# Patient Record
Sex: Male | Born: 1940 | Race: White | Hispanic: No | Marital: Married | Smoking: Never smoker
Health system: Southern US, Community
[De-identification: ages and names within clinical notes are randomized; demographics above are authoritative.]

## PROBLEM LIST (undated history)

## (undated) DIAGNOSIS — F0390 Unspecified dementia without behavioral disturbance: Secondary | ICD-10-CM

## (undated) DIAGNOSIS — R12 Heartburn: Secondary | ICD-10-CM

## (undated) DIAGNOSIS — I509 Heart failure, unspecified: Secondary | ICD-10-CM

## (undated) DIAGNOSIS — Z2239 Carrier of other specified bacterial diseases: Secondary | ICD-10-CM

## (undated) DIAGNOSIS — I4891 Unspecified atrial fibrillation: Secondary | ICD-10-CM

## (undated) DIAGNOSIS — I499 Cardiac arrhythmia, unspecified: Secondary | ICD-10-CM

## (undated) DIAGNOSIS — R238 Other skin changes: Secondary | ICD-10-CM

## (undated) DIAGNOSIS — I639 Cerebral infarction, unspecified: Secondary | ICD-10-CM

## (undated) DIAGNOSIS — Z950 Presence of cardiac pacemaker: Secondary | ICD-10-CM

## (undated) DIAGNOSIS — C679 Malignant neoplasm of bladder, unspecified: Secondary | ICD-10-CM

## (undated) DIAGNOSIS — D649 Anemia, unspecified: Secondary | ICD-10-CM

## (undated) DIAGNOSIS — I429 Cardiomyopathy, unspecified: Secondary | ICD-10-CM

## (undated) DIAGNOSIS — I350 Nonrheumatic aortic (valve) stenosis: Secondary | ICD-10-CM

## (undated) DIAGNOSIS — K219 Gastro-esophageal reflux disease without esophagitis: Secondary | ICD-10-CM

## (undated) DIAGNOSIS — R600 Localized edema: Secondary | ICD-10-CM

## (undated) DIAGNOSIS — J449 Chronic obstructive pulmonary disease, unspecified: Secondary | ICD-10-CM

## (undated) DIAGNOSIS — E785 Hyperlipidemia, unspecified: Secondary | ICD-10-CM

## (undated) DIAGNOSIS — I2510 Atherosclerotic heart disease of native coronary artery without angina pectoris: Secondary | ICD-10-CM

## (undated) HISTORY — PX: CARDIAC VALVE REPLACEMENT: SHX585

## (undated) HISTORY — DX: Malignant neoplasm of bladder, unspecified: C67.9

## (undated) HISTORY — DX: Unspecified atrial fibrillation: I48.91

## (undated) HISTORY — PX: TONSILLECTOMY: SUR1361

## (undated) HISTORY — PX: CORONARY ARTERY BYPASS GRAFT: SHX141

## (undated) HISTORY — DX: Heart failure, unspecified: I50.9

## (undated) HISTORY — PX: OTHER SURGICAL HISTORY: SHX169

## (undated) HISTORY — DX: Heartburn: R12

## (undated) HISTORY — DX: Cardiac arrhythmia, unspecified: I49.9

---

## 2016-06-09 ENCOUNTER — Other Ambulatory Visit: Payer: Self-pay | Admitting: Internal Medicine

## 2016-06-09 ENCOUNTER — Ambulatory Visit
Admission: RE | Admit: 2016-06-09 | Discharge: 2016-06-09 | Disposition: A | Discharge status: Home / Self Care | Payer: Medicare Other | Source: Ambulatory Visit | Attending: Internal Medicine | Admitting: Internal Medicine

## 2016-06-09 DIAGNOSIS — J90 Pleural effusion, not elsewhere classified: Secondary | ICD-10-CM | POA: Insufficient documentation

## 2016-06-09 DIAGNOSIS — R1902 Left upper quadrant abdominal swelling, mass and lump: Secondary | ICD-10-CM | POA: Diagnosis present

## 2016-06-09 DIAGNOSIS — R079 Chest pain, unspecified: Secondary | ICD-10-CM | POA: Diagnosis present

## 2016-06-09 DIAGNOSIS — M47896 Other spondylosis, lumbar region: Secondary | ICD-10-CM | POA: Insufficient documentation

## 2016-06-09 DIAGNOSIS — R0602 Shortness of breath: Secondary | ICD-10-CM | POA: Diagnosis present

## 2016-06-09 DIAGNOSIS — K429 Umbilical hernia without obstruction or gangrene: Secondary | ICD-10-CM | POA: Insufficient documentation

## 2016-06-09 DIAGNOSIS — I700 Atherosclerosis of aorta: Secondary | ICD-10-CM | POA: Diagnosis not present

## 2016-06-09 DIAGNOSIS — I2510 Atherosclerotic heart disease of native coronary artery without angina pectoris: Secondary | ICD-10-CM | POA: Diagnosis not present

## 2016-06-09 DIAGNOSIS — I517 Cardiomegaly: Secondary | ICD-10-CM | POA: Insufficient documentation

## 2016-06-09 DIAGNOSIS — I482 Chronic atrial fibrillation, unspecified: Secondary | ICD-10-CM | POA: Insufficient documentation

## 2016-06-09 DIAGNOSIS — K8020 Calculus of gallbladder without cholecystitis without obstruction: Secondary | ICD-10-CM | POA: Diagnosis not present

## 2016-06-09 LAB — POCT I-STAT CREATININE: CREATININE: 1.1 mg/dL (ref 0.61–1.24)

## 2016-06-09 MED ORDER — IOPAMIDOL (ISOVUE-300) INJECTION 61%
125.0000 mL | Freq: Once | INTRAVENOUS | Status: AC | PRN
  Administered 2016-06-09: 125 mL via INTRAVENOUS

## 2016-06-12 DIAGNOSIS — E538 Deficiency of other specified B group vitamins: Secondary | ICD-10-CM | POA: Insufficient documentation

## 2016-06-12 DIAGNOSIS — I700 Atherosclerosis of aorta: Secondary | ICD-10-CM | POA: Insufficient documentation

## 2016-06-12 DIAGNOSIS — D500 Iron deficiency anemia secondary to blood loss (chronic): Secondary | ICD-10-CM | POA: Insufficient documentation

## 2016-06-26 NOTE — Progress Notes (Signed)
06/27/2016 11:07 AM   Kyle Gregory [birth year] [id number]  Referring provider: Rusty Aus, MD [place] [facility] [place], [place] [address]  Chief Complaint  Patient presents with  . New Patient (Initial Visit)    gross hematuria referred by Dr. Emily Gregory    HPI: Patient is a 76 -year-old Caucasian male who presents today as a referral from their PCP, Dr. Sabra Gregory, for microscopic hematuria.  Patient was found to have microscopic hematuria on 06/09/2016 with 10-50 RBC's/hpf with wife, Kyle Gregory, and daughter, Kyle Gregory.    Patient doesn't have a prior history of microscopic hematuria.    He does not have a prior history of recurrent urinary tract infections, nephrolithiasis, trauma to the genitourinary tract, BPH or malignancies of the genitourinary tract.   He does not have a family medical history of nephrolithiasis, malignancies of the genitourinary tract or hematuria.   Today, he is having symptoms of frequent urination x 5-6,  urgency, nocturia x 1.  His UA today demonstrates 11-30 WBC's and > 30 RBC's.  .  He is not experiencing any suprapubic pain, abdominal pain or flank pain. He denies any recent fevers, chills, nausea or vomiting.  He underwent a contrast CT on 06/09/2016 which noted cardiomegaly with 3 vessel coronary arteriosclerosis and aortic atherosclerosis.  Small bilateral pleural effusions with atelectasis. Ground-glass mosaic attenuation of the lungs may reflect air trapping possibly constrictive bronchiolitis.  Eccentric thickening of the right posterolateral wall of the bladder. A transitional cell lesion or focal inflammation is not excluded. Direct visual correlation is recommended.  Cholelithiasis without complication.  Fat containing umbilical hernia.  . Lumbar spondylosis.   I have independently reviewed the films.  He is not a smoker.   He is not exposed to secondhand smoke.  He worked with [employer] for a short time  after high school.     PMH: Past Medical History:  Diagnosis Date  . Atrial fibrillation ([place])   . Heartburn     Surgical History: Past Surgical History:  Procedure Laterality Date  . neck growth removal     1970's    Home Medications:  Allergies as of 06/27/2016   No Known Allergies     Medication List       Accurate as of 06/27/16 11:07 AM. Always use your most recent med list.          furosemide 20 MG tablet Commonly known as:  LASIX Take by mouth.   HM VITAMIN B-12 ULTRA STRENGTH 5000 MCG Tbdp Generic drug:  Cyanocobalamin Take 5,000 mcg by mouth daily.   lisinopril 10 MG tablet Commonly known as:  PRINIVIL,ZESTRIL Take by mouth.   metoprolol succinate 50 MG 24 hr tablet Commonly known as:  TOPROL-XL Take by mouth.   omeprazole 20 MG capsule Commonly known as:  PRILOSEC Take by mouth.   VITRON-C 65-125 MG Tabs Generic drug:  Iron-Vitamin C Take by mouth.       Allergies: No Known Allergies  Family History: Family History  Problem Relation Age of Onset  . Heart failure Father   . Heart failure Brother   . Atrial fibrillation Sister   . Prostate cancer Neg Hx   . Kidney cancer Neg Hx   . Bladder Cancer Neg Hx     Social History:  reports that he has never smoked. He has never used smokeless tobacco. He reports that he does not drink alcohol or use drugs.  ROS: UROLOGY Frequent Urination?: Yes  Hard to postpone urination?: Yes Burning/pain with urination?: No Get up at night to urinate?: Yes Leakage of urine?: No Urine stream starts and stops?: No Trouble starting stream?: No Do you have to strain to urinate?: No Blood in urine?: Yes Urinary tract infection?: No Sexually transmitted disease?: No Injury to kidneys or bladder?: No Painful intercourse?: No Weak stream?: No Erection problems?: No Penile pain?: No  Gastrointestinal Nausea?: No Vomiting?: No Indigestion/heartburn?: Yes Diarrhea?: No Constipation?:  No  Constitutional Fever: No Night sweats?: No Weight loss?: No Fatigue?: Yes  Skin Skin rash/lesions?: No Itching?: No  Eyes Blurred vision?: No Double vision?: No  Ears/Nose/Throat Sore throat?: No Sinus problems?: No  Hematologic/Lymphatic Swollen glands?: No Easy bruising?: No  Cardiovascular Leg swelling?: Yes Chest pain?: No  Respiratory Cough?: No Shortness of breath?: Yes  Endocrine Excessive thirst?: No  Musculoskeletal Back pain?: No Joint pain?: Yes  Neurological Headaches?: No Dizziness?: Yes  Psychologic Depression?: No Anxiety?: No  Physical Exam: BP 117/75   Pulse 87   Ht 5\' 7"  (1.702 m)   Wt 261 lb 14.4 oz (118.8 kg)   BMI 41.02 kg/m   Constitutional: Well nourished. Alert and oriented, No acute distress. HEENT: [place] AT, moist mucus membranes. Trachea midline, no masses. Cardiovascular: No clubbing, cyanosis, or edema. Respiratory: Normal respiratory effort, no increased work of breathing. GI: Abdomen is soft, non tender, non distended, no abdominal masses. Liver and spleen not palpable.  No hernias appreciated.  Stool sample for occult testing is not indicated.   GU: No CVA tenderness.  No bladder fullness or masses.  Patient with circumcised phallus. Urethral meatus is patent.  No penile discharge. No penile lesions or rashes. Scrotum without lesions, cysts, rashes and/or edema.  Testicles are located scrotally bilaterally. No masses are appreciated in the testicles. Left and right epididymis are normal. Rectal: Patient with  normal sphincter tone. Anus and perineum without scarring or rashes. No rectal masses are appreciated. Prostate is approximately 55 grams, no nodules are appreciated. Seminal vesicles are normal. Skin: No rashes, bruises or suspicious lesions. Lymph: No cervical or inguinal adenopathy. Neurologic: Grossly intact, no focal deficits, moving all 4 extremities. Psychiatric: Normal mood and affect.  Laboratory  Data:  Lab Results  Component Value Date   CREATININE 1.10 06/09/2016   PSA history  0.36 ng/mL on 06/09/2016  Urinalysis 11-30 WBC's.  > 30 RBC's.  See EPIC.    Pertinent Imaging: CLINICAL DATA:  Dyspnea x3 weeks. Abdominal swelling times 7-8 months. Denies nausea, vomiting and diarrhea.  EXAM: CT CHEST, ABDOMEN, AND PELVIS WITH CONTRAST  TECHNIQUE: Multidetector CT imaging of the chest, abdomen and pelvis was performed following the standard protocol during bolus administration of intravenous contrast.  CONTRAST:  129mL ISOVUE-300 IOPAMIDOL (ISOVUE-300) INJECTION 61%  COMPARISON:  None.  FINDINGS: CT CHEST FINDINGS  Cardiovascular: Cardiomegaly without pericardial effusion or thickening. Aortic atherosclerosis without dissection or aneurysm. Three-vessel coronary arteriosclerosis. No large central pulmonary embolus.  Mediastinum/Nodes: Small middle mediastinal lymph nodes are noted without pathologic appearing enlargement, the largest is right lower paratracheal measuring approximately 0.9 cm short axis. The thyroid gland is normal in size with 6 mm calcification noted in the right thyroid gland. The mainstem bronchi and trachea are patent. No esophageal abnormality identified.  Lungs/Pleura: Small bilateral pleural effusions. Lingular and right middle lobe atelectasis versus scarring. No pulmonary consolidation or pneumothorax. Slight mosaic attenuation of the lungs bilaterally.  Musculoskeletal: No acute osseous abnormality. Flowing osteophytes along the lower thoracic spine consistent with diffuse  idiopathic skeletal hyperostosis.  CT ABDOMEN PELVIS FINDINGS  Hepatobiliary: Uncomplicated cholelithiasis. No space-occupying mass of the liver. No biliary dilatation.  Pancreas: Unremarkable. No pancreatic ductal dilatation or surrounding inflammatory changes.  Spleen: Normal in size without focal abnormality.  Adrenals/Urinary Tract: Simple  bilateral renal cysts ranging in size from 1.2 cm through 3.7 cm on the right and 3.3 cm on left. Normal bilateral adrenal glands. No nephrolithiasis nor hydronephrosis. Eccentric mucosal thickening along posterolateral wall of the bladder on the right. Direct visual correlation is recommended to exclude a mucosal lesion.  Stomach/Bowel: Normal small bowel rotation. Contrast distended stomach. No small nor large bowel inflammation or obstruction. Scattered colonic diverticulosis without acute diverticulitis. Normal appendix.  Vascular/Lymphatic: Aortic and branch vessel atherosclerosis. Bilateral iliofemoral atherosclerosis. No lymphadenopathy by size criteria.  Reproductive: Prostate is unremarkable.  Other: Fat containing umbilical hernia.  No ascites or free air.  Musculoskeletal: Lumbar spondylosis with degenerative disc disease L2-3 and L4-5 with lower lumbar facet arthrosis. No acute osseous abnormality. Bone islands noted of right femoral head.  IMPRESSION: 1. Cardiomegaly with 3 vessel coronary arteriosclerosis and aortic atherosclerosis. 2. Small bilateral pleural effusions with atelectasis. Ground-glass mosaic attenuation of the lungs may reflect air trapping possibly constrictive bronchiolitis. 3. Eccentric thickening of the right posterolateral wall of the bladder. A transitional cell lesion or focal inflammation is not excluded. Direct visual correlation is recommended. 4. Cholelithiasis without complication. 5. Fat containing umbilical hernia. 6. Lumbar spondylosis.   Electronically Signed   By: Ashley Royalty M.D.   On: 06/09/2016 19:11   Assessment & Plan:    1. Gross hematuria  - I explained to the patient that there are a number of causes that can be associated with blood in the urine, such as stones, BPH, UTI's, damage to the urinary tract and/or cancer.  - At this time, I felt that the patient warranted further urologic evaluation.   The AUA  guidelines state that a CT urogram is the preferred imaging study to evaluate hematuria - the contrast study he underwent on 06/09/2016 lacks the detail to exclude some urologic tumors  - If a bladder tumor is discovered, we can pursue cystoscopy with bilateral retrogrades in the OR to complete the hematuria workup in addition to the imaging studies during the TURBT  - If no bladder tumor is discovered, we can pursue a CT Urogram to complete the hematuria work up  - I've recommended a cystoscopy. I described how this is performed, typically in an office setting with a flexible cystoscope. We described the risks, benefits, and possible side effects, the most common of which is a minor amount of blood in the urine and/or burning which usually resolves in 24 to 48 hours.   - The patient had the opportunity to ask questions which were answered. Based upon this discussion, the patient is willing to proceed. Therefore, I've ordered: a cystoscopy.  - The patient will return following all of the above for discussion of the results.   - UA  - Urine culture    2. Bladder wall thickening  - patient will be undergoing cystoscopy in the future  Return for cystoscopy.  These notes generated with voice recognition software. I apologize for typographical errors.  Zara Council, [facility] [address] [place], [place] [address] [phone]

## 2016-06-27 ENCOUNTER — Ambulatory Visit (INDEPENDENT_AMBULATORY_CARE_PROVIDER_SITE_OTHER): Payer: Medicare Other | Admitting: Urology

## 2016-06-27 ENCOUNTER — Encounter: Payer: Self-pay | Admitting: Urology

## 2016-06-27 VITALS — BP 117/75 | HR 87 | Ht 67.0 in | Wt 261.9 lb

## 2016-06-27 DIAGNOSIS — R3129 Other microscopic hematuria: Secondary | ICD-10-CM

## 2016-06-27 DIAGNOSIS — N3289 Other specified disorders of bladder: Secondary | ICD-10-CM

## 2016-06-27 LAB — URINALYSIS, COMPLETE
Bilirubin, UA: NEGATIVE
GLUCOSE, UA: NEGATIVE
KETONES UA: NEGATIVE
Nitrite, UA: NEGATIVE
PROTEIN UA: NEGATIVE
Urobilinogen, Ur: 0.2 mg/dL (ref 0.2–1.0)
pH, UA: 5.5 (ref 5.0–7.5)

## 2016-06-27 LAB — MICROSCOPIC EXAMINATION: RBC, UA: >30 /hpf — AB (ref 0–?)

## 2016-06-27 NOTE — Patient Instructions (Addendum)
Cystoscopy, Care After Refer to this sheet in the next few weeks. These instructions provide you with information about caring for yourself after your procedure. Your health care provider may also give you more specific instructions. Your treatment has been planned according to current medical practices, but problems sometimes occur. Call your health care provider if you have any problems or questions after your procedure. What can I expect after the procedure? After the procedure, it is common to have:  Mild pain when you urinate. Pain should stop within a few minutes after you urinate. This may last for up to 1 week.  A small amount of blood in your urine for several days.  Feeling like you need to urinate but producing only a small amount of urine. Follow these instructions at home:   Medicines   Take over-the-counter and prescription medicines only as told by your health care provider.  If you were prescribed an antibiotic medicine, take it as told by your health care provider. Do not stop taking the antibiotic even if you start to feel better. General instructions    Return to your normal activities as told by your health care provider. Ask your health care provider what activities are safe for you.  Do not drive for 24 hours if you received a sedative.  Watch for any blood in your urine. If the amount of blood in your urine increases, call your health care provider.  Follow instructions from your health care provider about eating or drinking restrictions.  If a tissue sample was removed for testing (biopsy) during your procedure, it is your responsibility to get your test results. Ask your health care provider or the department performing the test when your results will be ready.  Drink enough fluid to keep your urine clear or pale yellow.  Keep all follow-up visits as told by your health care provider. This is important. Contact a health care provider if:  You have pain that  gets worse or does not get better with medicine, especially pain when you urinate.  You have difficulty urinating. Get help right away if:  You have more blood in your urine.  You have blood clots in your urine.  You have abdominal pain.  You have a fever or chills.  You are unable to urinate. This information is not intended to replace advice given to you by your health care provider. Make sure you discuss any questions you have with your health care provider. Document Released: 11/04/2004 Document Revised: 09/23/2015 Document Reviewed: 03/04/2015 Elsevier Interactive Patient Education  2017 [employer].  Cystoscopy Cystoscopy is a procedure that is used to help diagnose and sometimes treat conditions that affect that lower urinary tract. The lower urinary tract includes the bladder and the tube that drains urine from the bladder out of the body (urethra). Cystoscopy is performed with a thin, tube-shaped instrument with a light and camera at the end (cystoscope). The cystoscope may be hard (rigid) or flexible, depending on the goal of the procedure.The cystoscope is inserted through the urethra, into the bladder. Cystoscopy may be recommended if you have:  Urinary tractinfections that keep coming back (recurring).  Blood in the urine (hematuria).  Loss of bladder control (urinary incontinence) or an overactive bladder.  Unusual cells found in a urine sample.  A blockage in the urethra.  Painful urination.  An abnormality in the bladder found during an intravenous pyelogram (IVP) or CT scan. Cystoscopy may also be done to remove a sample of tissue  to be examined under a microscope (biopsy). Tell a health care provider about:  Any allergies you have.  All medicines you are taking, including vitamins, herbs, eye drops, creams, and over-the-counter medicines.  Any problems you or family members have had with anesthetic medicines.  Any blood disorders you have.  Any  surgeries you have had.  Any medical conditions you have.  Whether you are pregnant or may be pregnant. What are the risks? Generally, this is a safe procedure. However, problems may occur, including:  Infection.  Bleeding.  Allergic reactions to medicines.  Damage to other structures or organs. What happens before the procedure?  Ask your health care provider about:  Changing or stopping your regular medicines. This is especially important if you are taking diabetes medicines or blood thinners.  Taking medicines such as aspirin and ibuprofen. These medicines can thin your blood. Do not take these medicines before your procedure if your health care provider instructs you not to.  Follow instructions from your health care provider about eating or drinking restrictions.  You may be given antibiotic medicine to help prevent infection.  You may have an exam or testing, such as X-rays of the bladder, urethra, or kidneys.  You may have urine tests to check for signs of infection.  Plan to have someone take you home after the procedure. What happens during the procedure?  To reduce your risk of infection,your health care team will wash or sanitize their hands.  You will be given one or more of the following:  A medicine to help you relax (sedative).  A medicine to numb the area (local anesthetic).  The area around the opening of your urethra will be cleaned.  The cystoscope will be passed through your urethra into your bladder.  Germ-free (sterile)fluid will flow through the cystoscope to fill your bladder. The fluid will stretch your bladder so that your surgeon can clearly examine your bladder walls.  The cystoscope will be removed and your bladder will be emptied. The procedure may vary among health care providers and hospitals. What happens after the procedure?  You may have some soreness or pain in your abdomen and urethra. Medicines will be available to help  you.  You may have some blood in your urine.  Do not drive for 24 hours if you received a sedative. This information is not intended to replace advice given to you by your health care provider. Make sure you discuss any questions you have with your health care provider. Document Released: 04/14/2000 Document Revised: 08/26/2015 Document Reviewed: 03/04/2015 Elsevier Interactive Patient Education  2017 [employer].

## 2016-06-30 LAB — CULTURE, URINE COMPREHENSIVE

## 2016-07-11 DIAGNOSIS — I420 Dilated cardiomyopathy: Secondary | ICD-10-CM | POA: Insufficient documentation

## 2016-07-11 DIAGNOSIS — I350 Nonrheumatic aortic (valve) stenosis: Secondary | ICD-10-CM | POA: Insufficient documentation

## 2016-07-12 ENCOUNTER — Other Ambulatory Visit: Payer: Self-pay | Admitting: Internal Medicine

## 2016-07-12 DIAGNOSIS — I6523 Occlusion and stenosis of bilateral carotid arteries: Secondary | ICD-10-CM

## 2016-07-17 ENCOUNTER — Other Ambulatory Visit: Payer: Self-pay | Admitting: Gastroenterology

## 2016-07-17 DIAGNOSIS — R1310 Dysphagia, unspecified: Secondary | ICD-10-CM

## 2016-07-18 ENCOUNTER — Ambulatory Visit: Payer: Medicare Other | Admitting: Urology

## 2016-07-18 ENCOUNTER — Encounter: Payer: Self-pay | Admitting: Urology

## 2016-07-18 VITALS — BP 154/94 | HR 78 | Ht 72.0 in | Wt 248.6 lb

## 2016-07-18 DIAGNOSIS — R3129 Other microscopic hematuria: Secondary | ICD-10-CM

## 2016-07-18 LAB — URINALYSIS, COMPLETE
BILIRUBIN UA: NEGATIVE
Glucose, UA: NEGATIVE
Ketones, UA: NEGATIVE
LEUKOCYTES UA: NEGATIVE
Nitrite, UA: NEGATIVE
PH UA: 5 (ref 5.0–7.5)
Protein, UA: NEGATIVE
Specific Gravity, UA: <1.005 — ABNORMAL LOW (ref 1.005–1.030)
UUROB: 0.2 mg/dL (ref 0.2–1.0)

## 2016-07-18 LAB — MICROSCOPIC EXAMINATION
Bacteria, UA: NONE SEEN
Epithelial Cells (non renal): NONE SEEN /hpf (ref 0–10)
WBC UA: NONE SEEN /HPF (ref 0–?)

## 2016-07-18 MED ORDER — CIPROFLOXACIN HCL 500 MG PO TABS
500.0000 mg | ORAL_TABLET | Freq: Once | ORAL | Status: AC
  Administered 2016-07-18: 500 mg via ORAL

## 2016-07-18 MED ORDER — LIDOCAINE HCL 2 % EX GEL
1.0000 "application " | Freq: Once | CUTANEOUS | Status: AC
  Administered 2016-07-18: 1 via URETHRAL

## 2016-07-18 NOTE — Addendum Note (Signed)
Addended by: Tommy Rainwater on: 07/18/2016 04:45 PM   Modules accepted: Orders

## 2016-07-18 NOTE — Progress Notes (Signed)
   07/18/16  CC:  Chief Complaint  Patient presents with  . Cysto    microscopic hematuria     HPI: 76 year old with history of microscopic hematuria February 2018 with 10-50 red blood cells per high-powered field seen on UA. He had a normal DRE and a PSA of 0.36. Urine culture grew 1000 mixed growth. CT scan 06/09/2016 which showed possible neoplasm right posterior bladder. He presents for cystoscopy. He's had no gross hematuria or dysuria.    There were no vitals taken for this visit. NED. A&Ox3.   No respiratory distress   Abd soft, NT, ND Normal phallus with bilateral descended testicles  Cystoscopy Procedure Note  Patient identification was confirmed, informed consent was obtained, and patient was prepped using Betadine solution.  Lidocaine jelly was administered per urethral meatus.    Preoperative abx where received prior to procedure.     Pre-Procedure: - Inspection reveals a normal caliber ureteral meatus.  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 We discussed the nature risks benefits and alternatives to transurethral resection of bladder tumor. We discussed risk of bleeding infection and perforation among others. At this point I think there are too many tumors to utilize mitomycin-C. We discussed he may need a staged procedure and he may need a right ureteral stent although I believe the tumors are foreign of lateral from the right ureteral  orifice. We discussed he may need a staged procedure. I discussed one of my colleagues would be performing the procedure. Per Dr. Ammie Ferrier note, we will set up cardiology clearance.

## 2016-07-19 ENCOUNTER — Telehealth: Payer: Self-pay

## 2016-07-19 ENCOUNTER — Ambulatory Visit
Admission: RE | Admit: 2016-07-19 | Discharge: 2016-07-19 | Disposition: A | Discharge status: Home / Self Care | Payer: Medicare Other | Source: Ambulatory Visit | Attending: Internal Medicine | Admitting: Internal Medicine

## 2016-07-19 DIAGNOSIS — I6523 Occlusion and stenosis of bilateral carotid arteries: Secondary | ICD-10-CM

## 2016-07-19 NOTE — Telephone Encounter (Signed)
Spoke with patient and notified him that surgery has been scheduled on 08-14-16 with Dr. Erlene Quan for a TURBT, pre-admit apt on 08-07-16@8am . A clearance form has been sent to Dr. Ubaldo Glassing and will follow up after his apt on 08-04-16. Patient verbalized agreement and will call if he has any questions or concerns

## 2016-07-22 LAB — CULTURE, URINE COMPREHENSIVE

## 2016-07-26 ENCOUNTER — Ambulatory Visit
Admission: RE | Admit: 2016-07-26 | Discharge: 2016-07-26 | Disposition: A | Discharge status: Home / Self Care | Payer: Medicare Other | Source: Ambulatory Visit | Attending: Gastroenterology | Admitting: Gastroenterology

## 2016-07-26 DIAGNOSIS — K219 Gastro-esophageal reflux disease without esophagitis: Secondary | ICD-10-CM | POA: Diagnosis not present

## 2016-07-26 DIAGNOSIS — K449 Diaphragmatic hernia without obstruction or gangrene: Secondary | ICD-10-CM | POA: Diagnosis not present

## 2016-07-26 DIAGNOSIS — R1310 Dysphagia, unspecified: Secondary | ICD-10-CM | POA: Diagnosis present

## 2016-08-04 ENCOUNTER — Other Ambulatory Visit: Payer: Medicare Other

## 2016-08-06 ENCOUNTER — Telehealth: Payer: Self-pay | Admitting: Urology

## 2016-08-06 NOTE — Telephone Encounter (Signed)
Patient's wife, [name], called the office and left a voice mail stating that patient has a pre-op appointment that they need to change.  Please contact them at [phone].

## 2016-08-07 ENCOUNTER — Encounter
Admission: RE | Admit: 2016-08-07 | Discharge: 2016-08-07 | Disposition: A | Discharge status: Home / Self Care | Payer: Medicare Other | Source: Ambulatory Visit | Attending: Urology | Admitting: Urology

## 2016-08-07 DIAGNOSIS — Z01818 Encounter for other preprocedural examination: Secondary | ICD-10-CM | POA: Insufficient documentation

## 2016-08-07 HISTORY — DX: Anemia, unspecified: D64.9

## 2016-08-07 HISTORY — DX: Nonrheumatic aortic (valve) stenosis: I35.0

## 2016-08-07 HISTORY — DX: Cardiomyopathy, unspecified: I42.9

## 2016-08-07 NOTE — Patient Instructions (Signed)
Your procedure is scheduled on: Monday 08/14/16 Report to [facility]. 2ND FLOOR MEDICAL MALL ENTRANCE. To find out your arrival time please call [phone] between 1PM - 3PM on Friday 08/11/16.  Remember: Instructions that are not followed completely may result in serious medical risk, up to and including death, or upon the discretion of your surgeon and anesthesiologist your surgery may need to be rescheduled.    __X__ 1. Do not eat food or drink liquids after midnight. No gum chewing or hard candies.     __X__ 2. No Alcohol for 24 hours before or after surgery.   ____ 3. Bring all medications with you on the day of surgery if instructed.    __X__ 4. Notify your doctor if there is any change in your medical condition     (cold, fever, infections).             ___X__5. No smoking within 24 hours of your surgery.     Do not wear jewelry, make-up, hairpins, clips or nail polish.  Do not wear lotions, powders, or perfumes.   Do not shave 48 hours prior to surgery. Men may shave face and neck.  Do not bring valuables to the hospital.    [facility] is not responsible for any belongings or valuables.               Contacts, dentures or bridgework may not be worn into surgery.  Leave your suitcase in the car. After surgery it may be brought to your room.  For patients admitted to the hospital, discharge time is determined by your                treatment team.   Patients discharged the day of surgery will not be allowed to drive home.   Please read over the following fact sheets that you were given:   Pain Booklet and MRSA Information   __X__ Take these medicines the morning of surgery with A SIP OF WATER:    1. LISINOPRIL  2. METOPROLOL  3. OMEPRAZOLE  4.  5.  6.  ____ Fleet Enema (as directed)   ____ Use CHG Soap as directed  ____ Use inhalers on the day of surgery  ____ Stop metformin 2 days prior to surgery    ____ Take 1/2 of usual insulin dose the night before  surgery and none on the morning of surgery.   ____ Stop Coumadin/Plavix/aspirin on   __X__ Stop Anti-inflammatories such as Advil, Aleve, Ibuprofen, Motrin, Naproxen, Naprosyn, Goodies,powder, or aspirin products.  OK to take Tylenol.   __X__ Stop supplements until after surgery.  IRON OK  ____ Bring C-Pap to the hospital.

## 2016-08-14 ENCOUNTER — Encounter
Admission: RE | Disposition: A | Discharge status: Home / Self Care | Payer: Self-pay | Source: Ambulatory Visit | Attending: Urology

## 2016-08-14 ENCOUNTER — Ambulatory Visit
Admission: RE | Admit: 2016-08-14 | Discharge: 2016-08-14 | Disposition: A | Discharge status: Home / Self Care | Payer: Medicare Other | Source: Ambulatory Visit | Attending: Urology | Admitting: Urology

## 2016-08-14 ENCOUNTER — Encounter: Payer: Self-pay | Admitting: Emergency Medicine

## 2016-08-14 ENCOUNTER — Ambulatory Visit: Payer: Medicare Other | Admitting: Certified Registered Nurse Anesthetist

## 2016-08-14 DIAGNOSIS — C672 Malignant neoplasm of lateral wall of bladder: Secondary | ICD-10-CM | POA: Insufficient documentation

## 2016-08-14 DIAGNOSIS — N359 Urethral stricture, unspecified: Secondary | ICD-10-CM | POA: Insufficient documentation

## 2016-08-14 DIAGNOSIS — D494 Neoplasm of unspecified behavior of bladder: Secondary | ICD-10-CM | POA: Diagnosis not present

## 2016-08-14 DIAGNOSIS — D414 Neoplasm of uncertain behavior of bladder: Secondary | ICD-10-CM | POA: Diagnosis present

## 2016-08-14 DIAGNOSIS — R311 Benign essential microscopic hematuria: Secondary | ICD-10-CM | POA: Diagnosis not present

## 2016-08-14 DIAGNOSIS — N99111 Postprocedural bulbous urethral stricture: Secondary | ICD-10-CM

## 2016-08-14 HISTORY — PX: CYSTOSCOPY W/ RETROGRADES: SHX1426

## 2016-08-14 HISTORY — PX: TRANSURETHRAL RESECTION OF BLADDER TUMOR: SHX2575

## 2016-08-14 SURGERY — TURBT (TRANSURETHRAL RESECTION OF BLADDER TUMOR)
Anesthesia: Choice | Site: Ureter | Laterality: Right | Wound class: Clean Contaminated

## 2016-08-14 MED ORDER — DOCUSATE SODIUM 100 MG PO CAPS: 100.0000 mg | ORAL_CAPSULE | Freq: Two times a day (BID) | ORAL | 0 refills | Status: DC

## 2016-08-14 MED ORDER — FENTANYL CITRATE (PF) 100 MCG/2ML IJ SOLN
INTRAMUSCULAR | Status: AC
  Filled 2016-08-14: qty 2

## 2016-08-14 MED ORDER — PHENYLEPHRINE HCL 10 MG/ML IJ SOLN
INTRAMUSCULAR | Status: DC | PRN
  Administered 2016-08-14: 100 ug via INTRAVENOUS
  Administered 2016-08-14: 50 ug via INTRAVENOUS
  Administered 2016-08-14 (×2): 100 ug via INTRAVENOUS
  Administered 2016-08-14: 50 ug via INTRAVENOUS
  Administered 2016-08-14: 100 ug via INTRAVENOUS

## 2016-08-14 MED ORDER — HYDROCODONE-ACETAMINOPHEN 5-325 MG PO TABS: 1.0000 | ORAL_TABLET | Freq: Four times a day (QID) | ORAL | 0 refills | Status: DC | PRN

## 2016-08-14 MED ORDER — CEFAZOLIN SODIUM-DEXTROSE 2-4 GM/100ML-% IV SOLN
INTRAVENOUS | Status: AC
  Filled 2016-08-14: qty 100

## 2016-08-14 MED ORDER — DEXTROSE 5 % IV SOLN
2.0000 g | INTRAVENOUS | Status: AC
  Administered 2016-08-14: 2 g via INTRAVENOUS
  Filled 2016-08-14: qty 2000

## 2016-08-14 MED ORDER — OXYBUTYNIN CHLORIDE 5 MG PO TABS: 5.0000 mg | ORAL_TABLET | Freq: Three times a day (TID) | ORAL | 0 refills | Status: DC | PRN

## 2016-08-14 MED ORDER — DEXAMETHASONE SODIUM PHOSPHATE 10 MG/ML IJ SOLN
INTRAMUSCULAR | Status: DC | PRN
  Administered 2016-08-14: 5 mg via INTRAVENOUS

## 2016-08-14 MED ORDER — IOTHALAMATE MEGLUMINE 43 % IV SOLN
INTRAVENOUS | Status: DC | PRN
  Administered 2016-08-14: 10 mL

## 2016-08-14 MED ORDER — ONDANSETRON HCL 4 MG/2ML IJ SOLN
INTRAMUSCULAR | Status: DC | PRN
  Administered 2016-08-14: 4 mg via INTRAVENOUS

## 2016-08-14 MED ORDER — LACTATED RINGERS IV SOLN
INTRAVENOUS | Status: DC
  Administered 2016-08-14: 07:00:00 via INTRAVENOUS

## 2016-08-14 MED ORDER — FENTANYL CITRATE (PF) 100 MCG/2ML IJ SOLN
INTRAMUSCULAR | Status: DC | PRN
  Administered 2016-08-14: 50 ug via INTRAVENOUS
  Administered 2016-08-14: 25 ug via INTRAVENOUS
  Administered 2016-08-14: 50 ug via INTRAVENOUS

## 2016-08-14 MED ORDER — GLYCOPYRROLATE 0.2 MG/ML IJ SOLN
INTRAMUSCULAR | Status: DC | PRN
  Administered 2016-08-14: .15 mg via INTRAVENOUS

## 2016-08-14 MED ORDER — LIDOCAINE HCL (CARDIAC) 20 MG/ML IV SOLN
INTRAVENOUS | Status: DC | PRN
  Administered 2016-08-14: 30 mg via INTRAVENOUS

## 2016-08-14 MED ORDER — PROPOFOL 10 MG/ML IV BOLUS
INTRAVENOUS | Status: AC
  Filled 2016-08-14: qty 20

## 2016-08-14 MED ORDER — PROPOFOL 10 MG/ML IV BOLUS
INTRAVENOUS | Status: DC | PRN
  Administered 2016-08-14: 150 mg via INTRAVENOUS

## 2016-08-14 MED ORDER — CEFTRIAXONE SODIUM-DEXTROSE 2-2.22 GM-% IV SOLR
2.0000 g | INTRAVENOUS | Status: DC
  Filled 2016-08-14: qty 50

## 2016-08-14 SURGICAL SUPPLY — 36 items
BAG DRAIN CYSTO-URO LG1000N (MISCELLANEOUS) ×5 IMPLANT
BAG URO DRAIN 2000ML W/SPOUT (MISCELLANEOUS) ×5 IMPLANT
CATH FOLEY 2WAY  5CC 16FR (CATHETERS)
CATH FOLEY 2WAY 5CC 18FR (CATHETERS) ×5 IMPLANT
CATH URETL 5X70 OPEN END (CATHETERS) ×5 IMPLANT
CATH URTH 16FR FL 2W BLN LF (CATHETERS) IMPLANT
CONRAY 43 FOR UROLOGY 50M (MISCELLANEOUS) ×5 IMPLANT
DRAPE UTILITY 15X26 TOWEL STRL (DRAPES) ×5 IMPLANT
DRSG TELFA 4X3 1S NADH ST (GAUZE/BANDAGES/DRESSINGS) ×5 IMPLANT
ELECT LOOP 22F BIPOLAR SML (ELECTROSURGICAL) ×5
ELECT REM PT RETURN 9FT ADLT (ELECTROSURGICAL)
ELECTRODE LOOP 22F BIPOLAR SML (ELECTROSURGICAL) ×3 IMPLANT
ELECTRODE REM PT RTRN 9FT ADLT (ELECTROSURGICAL) IMPLANT
GLOVE BIO SURGEON STRL SZ 6.5 (GLOVE) ×8 IMPLANT
GLOVE BIO SURGEONS STRL SZ 6.5 (GLOVE) ×2
GOWN STRL REUS W/ TWL LRG LVL3 (GOWN DISPOSABLE) ×6 IMPLANT
GOWN STRL REUS W/TWL LRG LVL3 (GOWN DISPOSABLE) ×4
HOLDER FOLEY CATH W/STRAP (MISCELLANEOUS) ×5 IMPLANT
KIT RM TURNOVER CYSTO AR (KITS) ×5 IMPLANT
LOOP CUT BIPOLAR 24F LRG (ELECTROSURGICAL) IMPLANT
NDL SAFETY ECLIPSE 18X1.5 (NEEDLE) IMPLANT
NEEDLE HYPO 18GX1.5 SHARP (NEEDLE)
PACK CYSTO AR (MISCELLANEOUS) ×5 IMPLANT
SCRUB POVIDONE IODINE 4 OZ (MISCELLANEOUS) ×5 IMPLANT
SENSORWIRE 0.038 NOT ANGLED (WIRE)
SET CYSTO W/LG BORE CLAMP LF (SET/KITS/TRAYS/PACK) IMPLANT
SET IRRIG Y TYPE TUR BLADDER L (SET/KITS/TRAYS/PACK) ×5 IMPLANT
SET IRRIGATING DISP (SET/KITS/TRAYS/PACK) ×5 IMPLANT
SOL .9 NS 3000ML IRR  AL (IV SOLUTION) ×12
SOL .9 NS 3000ML IRR UROMATIC (IV SOLUTION) ×18 IMPLANT
STENT URET 6FRX24 CONTOUR (STENTS) IMPLANT
STENT URET 6FRX26 CONTOUR (STENTS) IMPLANT
SURGILUBE 2OZ TUBE FLIPTOP (MISCELLANEOUS) ×5 IMPLANT
SYRINGE IRR TOOMEY STRL 70CC (SYRINGE) ×5 IMPLANT
WATER STERILE IRR 1000ML POUR (IV SOLUTION) ×5 IMPLANT
WIRE SENSOR 0.038 NOT ANGLED (WIRE) IMPLANT

## 2016-08-14 NOTE — Interval H&P Note (Signed)
History and Physical Interval Note:  08/14/2016 7:24 AM  Kyle Gregory  has presented today for surgery, with the diagnosis of BLADDER NEOPLASM UNCERTAIN  The various methods of treatment have been discussed with the patient and family. After consideration of risks, benefits and other options for treatment, the patient has consented to  Procedure(s): TRANSURETHRAL RESECTION OF BLADDER TUMOR (TURBT) (N/A) CYSTOSCOPY WITH RETROGRADE PYELOGRAM (Right) CYSTOSCOPY WITH STENT PLACEMENT (Right) as a surgical intervention .  The patient's history has been reviewed, patient examined, no change in status, stable for surgery.  I have reviewed the patient's chart and labs.  Questions were answered to the patient's satisfaction.    RRR CTAB   Hollice Espy

## 2016-08-14 NOTE — Anesthesia Preprocedure Evaluation (Signed)
Anesthesia Evaluation  Patient identified by MRN, date of birth, ID band Patient awake    Reviewed: Allergy & Precautions, H&P , NPO status , Patient's Chart, lab work & pertinent test results, reviewed documented beta blocker date and time   Airway Mallampati: II  TM Distance: >3 FB Neck ROM: full    Dental  (+) Teeth Intact   Pulmonary neg pulmonary ROS,    Pulmonary exam normal        Cardiovascular Exercise Tolerance: Good negative cardio ROS Normal cardiovascular exam Rate:Normal     Neuro/Psych negative neurological ROS  negative psych ROS   GI/Hepatic negative GI ROS, Neg liver ROS,   Endo/Other  negative endocrine ROS  Renal/GU negative Renal ROS  negative genitourinary   Musculoskeletal   Abdominal   Peds  Hematology negative hematology ROS (+) anemia ,   Anesthesia Other Findings   Reproductive/Obstetrics negative OB ROS                             Anesthesia Physical Anesthesia Plan  ASA: III  Anesthesia Plan: General LMA   Post-op Pain Management:    Induction:   Airway Management Planned:   Additional Equipment:   Intra-op Plan:   Post-operative Plan:   Informed Consent: I have reviewed the patients History and Physical, chart, labs and discussed the procedure including the risks, benefits and alternatives for the proposed anesthesia with the patient or authorized representative who has indicated his/her understanding and acceptance.     Plan Discussed with: CRNA  Anesthesia Plan Comments:         Anesthesia Quick Evaluation

## 2016-08-14 NOTE — Anesthesia Postprocedure Evaluation (Signed)
Anesthesia Post Note  Patient: Kyle Gregory  Procedure(s) Performed: Procedure(s) (LRB): TRANSURETHRAL RESECTION OF BLADDER TUMOR (TURBT) (N/A) CYSTOSCOPY WITH RETROGRADE PYELOGRAM (Bilateral)  Patient location during evaluation: PACU Anesthesia Type: General Level of consciousness: awake and alert Pain management: pain level controlled Vital Signs Assessment: post-procedure vital signs reviewed and stable Respiratory status: spontaneous breathing, nonlabored ventilation, respiratory function stable and patient connected to nasal cannula oxygen Cardiovascular status: blood pressure returned to baseline and stable Postop Assessment: no signs of nausea or vomiting Anesthetic complications: no     Last Vitals:  Vitals:   08/14/16 0913 08/14/16 0930  BP: 116/90 (!) 111/100  Pulse: 76 64  Resp: (!) 24 14  Temp: 36.4 C     Last Pain:  Vitals:   08/14/16 0930  TempSrc:   PainSc: 0-No pain                 Molli Barrows

## 2016-08-14 NOTE — H&P (View-Only) (Signed)
   07/18/16  CC:  Chief Complaint  Patient presents with  . Cysto    microscopic hematuria     HPI: 76 year old with history of microscopic hematuria February 2018 with 10-50 red blood cells per high-powered field seen on UA. He had a normal DRE and a PSA of 0.36. Urine culture grew 1000 mixed growth. CT scan 06/09/2016 which showed possible neoplasm right posterior bladder. He presents for cystoscopy. He's had no gross hematuria or dysuria.    There were no vitals taken for this visit. NED. A&Ox3.   No respiratory distress   Abd soft, NT, ND Normal phallus with bilateral descended testicles  Cystoscopy Procedure Note  Patient identification was confirmed, informed consent was obtained, and patient was prepped using Betadine solution.  Lidocaine jelly was administered per urethral meatus.    Preoperative abx where received prior to procedure.     Pre-Procedure: - Inspection reveals a normal caliber ureteral meatus.  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 We discussed the nature risks benefits and alternatives to transurethral resection of bladder tumor. We discussed risk of bleeding infection and perforation among others. At this point I think there are too many tumors to utilize mitomycin-C. We discussed he may need a staged procedure and he may need a right ureteral stent although I believe the tumors are foreign of lateral from the right ureteral  orifice. We discussed he may need a staged procedure. I discussed one of my colleagues would be performing the procedure. Per Dr. Ammie Ferrier note, we will set up cardiology clearance.

## 2016-08-14 NOTE — Anesthesia Procedure Notes (Signed)
Procedure Name: LMA Insertion Date/Time: 08/14/2016 8:02 AM Performed by: Kennon Holter Pre-anesthesia Checklist: Patient identified, Patient being monitored, Timeout performed, Emergency Drugs available and Suction available Patient Re-evaluated:Patient Re-evaluated prior to inductionOxygen Delivery Method: Circle system utilized Preoxygenation: Pre-oxygenation with 100% oxygen Intubation Type: IV induction Ventilation: Mask ventilation without difficulty LMA: LMA inserted LMA Size: 4.5 Tube type: Oral Number of attempts: 1 Placement Confirmation: positive ETCO2 and breath sounds checked- equal and bilateral Tube secured with: Tape Dental Injury: Teeth and Oropharynx as per pre-operative assessment

## 2016-08-14 NOTE — Discharge Instructions (Signed)
Transurethral Resection of Bladder Tumor (TURBT) or Bladder Biopsy   Definition:  Transurethral Resection of the Bladder Tumor is a surgical procedure used to diagnose and remove tumors within the bladder. TURBT is the most common treatment for early stage bladder cancer.  General instructions:     Your recent bladder surgery requires very little post hospital care but some definite precautions.  Despite the fact that no skin incisions were used, the area around the bladder incisions are raw and covered with scabs to promote healing and prevent bleeding. Certain precautions are needed to insure that the scabs are not disturbed over the next 2-4 weeks while the healing proceeds.  Because the raw surface inside your bladder and the irritating effects of urine you may expect frequency of urination and/or urgency (a stronger desire to urinate) and perhaps even getting up at night more often. This will usually resolve or improve slowly over the healing period. You may see some blood in your urine over the first 6 weeks. Do not be alarmed, even if the urine was clear for a while. Get off your feet and drink lots of fluids until clearing occurs. If you start to pass clots or don't improve call us.  Diet:  You may return to your normal diet immediately. Because of the raw surface of your bladder, alcohol, spicy foods, foods high in acid and drinks with caffeine may cause irritation or frequency and should be used in moderation. To keep your urine flowing freely and avoid constipation, drink plenty of fluids during the day (8-10 glasses). Tip: Avoid cranberry juice because it is very acidic.  Activity:  Your physical activity doesn't need to be restricted. However, if you are very active, you may see some blood in the urine. We suggest that you reduce your activity under the circumstances until the bleeding has stopped.  Bowels:  It is important to keep your bowels regular during the postoperative  period. Straining with bowel movements can cause bleeding. A bowel movement every other day is reasonable. Use a mild laxative if needed, such as milk of magnesia 2-3 tablespoons, or 2 Dulcolax tablets. Call if you continue to have problems. If you had been taking narcotics for pain, before, during or after your surgery, you may be constipated. Take a laxative if necessary.    Medication:  You should resume your pre-surgery medications unless told not to. In addition you may be given an antibiotic to prevent or treat infection. Antibiotics are not always necessary. All medication should be taken as prescribed until the bottles are finished unless you are having an unusual reaction to one of the drugs.   [place] [phone]    Indwelling Urinary Catheter Care, Adult Take good care of your catheter to keep it working and to prevent problems. How to wear your catheter Attach your catheter to your leg with tape (adhesive tape) or a leg strap. Make sure it is not too tight. If you use tape, remove any bits of tape that are already on the catheter. How to wear a drainage bag You should have:  A large overnight bag.  A small leg bag. Overnight Bag  You may wear the overnight bag at any time. Always keep the bag below the level of your bladder but off the floor. When you sleep, put a clean plastic bag in a wastebasket. Then hang the bag inside the wastebasket. Leg Bag  Never wear the leg bag at night. Always wear the leg bag  below your knee. Keep the leg bag secure with a leg strap or tape. How to care for your skin  Clean the skin around the catheter at least once every day.  Shower every day. Do not take baths.  Put creams, lotions, or ointments on your genital area only as told by your doctor.  Do not use powders, sprays, or lotions on your genital area. How to clean your catheter and your skin 1. Wash your hands with soap and water. 2. Wet a washcloth in  warm water and gentle (mild) soap. 3. Use the washcloth to clean the skin where the catheter enters your body. Clean downward and wipe away from the catheter in small circles. Do not wipe toward the catheter. 4. Pat the area dry with a clean towel. Make sure to clean off all soap. How to care for your drainage bags Empty your drainage bag when it is ?- full or at least 2-3 times a day. Replace your drainage bag once a month or sooner if it starts to smell bad or look dirty. Do not clean your drainage bag unless told by your doctor. Emptying a drainage bag   Supplies Needed  Rubbing alcohol.  Gauze pad or cotton ball.  Tape or a leg strap. Steps 1. Wash your hands with soap and water. 2. Separate (detach) the bag from your leg. 3. Hold the bag over the toilet or a clean container. Keep the bag below your hips and bladder. This stops pee (urine) from going back into the tube. 4. Open the pour spout at the bottom of the bag. 5. Empty the pee into the toilet or container. Do not let the pour spout touch any surface. 6. Put rubbing alcohol on a gauze pad or cotton ball. 7. Use the gauze pad or cotton ball to clean the pour spout. 8. Close the pour spout. 9. Attach the bag to your leg with tape or a leg strap. 10. Wash your hands. Changing a drainage bag  Supplies Needed  Alcohol wipes.  A clean drainage bag.  Adhesive tape or a leg strap. Steps 1. Wash your hands with soap and water. 2. Separate the dirty bag from your leg. 3. Pinch the rubber catheter with your fingers so that pee does not spill out. 4. Separate the catheter tube from the drainage tube where these tubes connect (at the connection valve). Do not let the tubes touch any surface. 5. Clean the end of the catheter tube with an alcohol wipe. Use a different alcohol wipe to clean the end of the drainage tube. 6. Connect the catheter tube to the drainage tube of the clean bag. 7. Attach the new bag to the leg with  adhesive tape or a leg strap. 8. Wash your hands. How to prevent infection and other problems  Never pull on your catheter or try to remove it. Pulling can damage tissue in your body.  Always wash your hands before and after touching your catheter.  If a leg strap gets wet, replace it with a dry one.  Drink enough fluids to keep your pee clear or pale yellow, or as told by your doctor.  Do not let the drainage bag or tubing touch the floor.  Wear cotton underwear.  If you are male, wipe from front to back after you poop (have a bowel movement).  Check on the catheter often to make sure it works and the tubing is not twisted. Get help if:  Your pee is  cloudy.  Your pee smells unusually bad.  Your pee is not draining into the bag.  Your tube gets clogged.  Your catheter starts to leak.  Your bladder feels full. Get help right away if:  You have redness, swelling, or pain where the catheter enters your body.  You have fluid, pus, or a bad smell coming from the area where the catheter enters your body.  The area where the catheter enters your body feels warm.  You have a fever.  You have pain in your:  Stomach (abdomen).  Legs.  Lower back.  Bladder.  You see blood fill the catheter.  Your pee is pink or red.  You feel sick to your stomach (nauseous).  You throw up (vomit).  You have chills.  Your catheter gets pulled out. This information is not intended to replace advice given to you by your health care provider. Make sure you discuss any questions you have with your health care provider. Document Released: 08/12/2012 Document Revised: 03/15/2016 Document Reviewed: 09/30/2013 Elsevier Interactive Patient Education  2017 [place]   1) The drugs that you were given will stay in your system until tomorrow so for the next 24 hours you should not:  A) Drive an automobile B) Make any legal  decisions C) Drink any alcoholic beverage   2) You may resume regular meals tomorrow.  Today it is better to start with liquids and gradually work up to solid foods.  You may eat anything you prefer, but it is better to start with liquids, then soup and crackers, and gradually work up to solid foods.   3) Please notify your doctor immediately if you have any unusual bleeding, trouble breathing, redness and pain at the surgery site, drainage, fever, or pain not relieved by medication.   Please contact your physician with any problems or Same Day Surgery at [phone], Monday through Friday 6 am to 4 pm, or [place] at [facility] number at [phone].

## 2016-08-14 NOTE — Anesthesia Post-op Follow-up Note (Cosign Needed)
Anesthesia QCDR form completed.        

## 2016-08-14 NOTE — Op Note (Signed)
Date of procedure: 08/14/16  Preoperative diagnosis:  1. Bladder tumor 2. Microscopic hematuria  Postoperative diagnosis:  1. Same as above  2. Bulbar urethral stricture  Procedure: 1. TURBT, medium 2. Bilateral retrograde pyelogram  Surgeon: Hollice Espy, MD  Anesthesia: General  Complications: None  Intraoperative findings: Approximately 3 cm x 3 cm area of papillary tumor, broad base, carpeting involving the right lateral wall of the bladder with 2 smaller less than 1 cm satellite lesions. 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  EBL: Minimal  Specimens: Bladder tumor, bladder biopsies  Drains: 18 French two-way Foley catheter  Indication: Kyle Gregory is a 76 y.o. patient with microscopic hematuria found to have right bladder wall thickening on CT abdomen pelvis confirmed cystoscopically in the office.  After reviewing the management options for treatment, he elected to proceed with the above surgical procedure(s). We have discussed the potential benefits and risks of the procedure, side effects of the proposed treatment, the likelihood of the patient achieving the goals of the procedure, and any potential problems that might occur during the procedure or recuperation. Informed consent has been obtained.  Description of procedure:  The patient was taken to the operating room and general anesthesia was induced.  The patient was placed in the dorsal lithotomy position, prepped and draped in the usual sterile fashion, and preoperative antibiotics were administered. A preoperative time-out was performed.   A 26 French resectoscope was advanced per urethra into the bladder using a visual obturator.  Of note, he had a tight area in his bulbar urethra with a mild strictured area through which the scope was ultimately able to be passed was quite tight.  The scope was then advanced into the bladder and careful inspection of the bladder revealed an approximate 3 x 3  cm area of papillary tumor on a broad base carpeting the right lateral wall of the bladder in close proximity but not involving the right UO. There are also 2 smaller less than 1 cm satellite lesions approaching the right dome of the bladder.  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   Attention was turned to the left ureteral orifice which was cannulated using a 5 [place] open-ended ureteral catheter. A gentle retrograde pyelogram was performed which revealed no filling defects or hydroureteronephrosis of the left. The collecting system drained probably. Attention was then turned to the right ureteral orifice and the same exact procedure was performed. This also revealed no evidence of filling defects or hydroureteronephrosis. The system also drained probably.  The cup biopsies were used to biopsy each of these erythematous areas which were passed off the field as bladder biopsies.  The bipolar resectoscope loop was then brought in and using saline as the medium, the tumor was resected at least on to level of muscularis propria the majority of the areas. There was a strong obturator reflex in the right lateral bladder wall and in order to avoid any injury to this area, the bladder was partially drained while resecting under this lesion. In several areas, the resection was somewhat deeper in small areas of fat could be seen in the right lateral bladder wall. His abdomen remained soft and nondistended.  The base of the tumor was then fulgurated using coagulation. Care was taken to avoid any resection of  the ureteral orifice and avoid any fulguration of this area as well.  Given the deeper resection, the decision was made to defer mitomycin at this time. The bladder tumor chips were removed from the bladder pathology field  as bladder tumor. After adequate hemostasis was achieved, the scope was removed. Prior to scope removal, grossly, there was no obvious residual tumor.  An 56 French Foley catheter was then placed in the balloon was filled with 10 cc of sterile water. The patient was then cleaned and dried, repositioned the supine position, reversed with incision, and taken to the PACU in stable condition.  Plan: Patient will return in 72 hours to have his catheter removed. He'll follow up next week for pathology results.   Hollice Espy, M.D.

## 2016-08-14 NOTE — Telephone Encounter (Signed)
done

## 2016-08-14 NOTE — Telephone Encounter (Signed)
-----   Message from Hollice Espy, MD sent at 08/14/2016  9:15 AM EDT ----- Regarding: f/u Please arrange for nurse visit voiding trial on Thursday and pathology review in 1 week with me.    Hollice Espy, MD

## 2016-08-14 NOTE — Progress Notes (Signed)
Pharmacy Antibiotic Note  Kyle Gregory is a 76 y.o. male admitted on 08/14/2016 with surgical prophylaxis.  Pharmacy has been consulted for Cefazolin dosing.  Plan: Cefazolin 2g IV x 1 prior to surgery     No data recorded.  No results for input(s): WBC, CREATININE, LATICACIDVEN, VANCOTROUGH, VANCOPEAK, VANCORANDOM, GENTTROUGH, GENTPEAK, GENTRANDOM, TOBRATROUGH, TOBRAPEAK, TOBRARND, AMIKACINPEAK, AMIKACINTROU, AMIKACIN in the last 168 hours.  CrCl cannot be calculated (Patient's most recent lab result is older than the maximum 21 days allowed.).    No Known Allergies   Thank you for allowing pharmacy to be a part of this patient's care.  Tobie Lords, PharmD, BCPS Clinical Pharmacist 08/14/2016

## 2016-08-14 NOTE — Transfer of Care (Signed)
Immediate Anesthesia Transfer of Care Note  Patient: Kyle Gregory  Procedure(s) Performed: Procedure(s): TRANSURETHRAL RESECTION OF BLADDER TUMOR (TURBT) (N/A) CYSTOSCOPY WITH RETROGRADE PYELOGRAM (Bilateral)  Patient Location: PACU  Anesthesia Type:General  Level of Consciousness: awake, alert  and oriented  Airway & Oxygen Therapy: Patient Spontanous Breathing and Patient connected to face mask oxygen  Post-op Assessment: Report given to RN, Post -op Vital signs reviewed and stable and Patient moving all extremities X 4  Post vital signs: Reviewed and stable  Last Vitals:  Vitals:   08/14/16 0630 08/14/16 0912  BP: (!) 154/100   Pulse: 74   Resp: 18   Temp: 36.3 C 36.4 C    Last Pain:  Vitals:   08/14/16 0630  TempSrc: Tympanic         Complications: No apparent anesthesia complications

## 2016-08-15 LAB — SURGICAL PATHOLOGY

## 2016-08-15 NOTE — Anesthesia Postprocedure Evaluation (Signed)
Anesthesia Post Note  Patient: Kyle Gregory  Procedure(s) Performed: Procedure(s) (LRB): TRANSURETHRAL RESECTION OF BLADDER TUMOR (TURBT) (N/A) CYSTOSCOPY WITH RETROGRADE PYELOGRAM (Bilateral)  Patient location during evaluation: PACU Anesthesia Type: General Level of consciousness: awake and alert Pain management: pain level controlled Vital Signs Assessment: post-procedure vital signs reviewed and stable Respiratory status: spontaneous breathing, nonlabored ventilation, respiratory function stable and patient connected to nasal cannula oxygen Cardiovascular status: blood pressure returned to baseline and stable Postop Assessment: no signs of nausea or vomiting Anesthetic complications: no     Last Vitals:  Vitals:   08/14/16 1011 08/14/16 1037  BP: (!) 152/102 (!) 154/93  Pulse: 82 83  Resp: 14 14  Temp: (!) 35.9 C     Last Pain:  Vitals:   08/15/16 0843  TempSrc:   PainSc: 2                  Molli Barrows

## 2016-08-17 ENCOUNTER — Ambulatory Visit: Payer: Medicare Other

## 2016-08-17 DIAGNOSIS — N3289 Other specified disorders of bladder: Secondary | ICD-10-CM

## 2016-08-17 NOTE — Progress Notes (Signed)
Pt presented today for foley removal. Pt tolerated well. No s/s of adverse reaction noted. Reinforced with pt s/s of infection. Pt voiced understanding.

## 2016-08-22 ENCOUNTER — Ambulatory Visit: Payer: Medicare Other | Admitting: Urology

## 2016-08-24 ENCOUNTER — Ambulatory Visit (INDEPENDENT_AMBULATORY_CARE_PROVIDER_SITE_OTHER): Payer: Medicare Other | Admitting: Urology

## 2016-08-24 ENCOUNTER — Encounter: Payer: Self-pay | Admitting: Urology

## 2016-08-24 VITALS — BP 96/56 | HR 79 | Ht 72.0 in | Wt 246.0 lb

## 2016-08-24 DIAGNOSIS — C672 Malignant neoplasm of lateral wall of bladder: Secondary | ICD-10-CM

## 2016-08-24 DIAGNOSIS — D090 Carcinoma in situ of bladder: Secondary | ICD-10-CM | POA: Diagnosis not present

## 2016-08-24 DIAGNOSIS — I6523 Occlusion and stenosis of bilateral carotid arteries: Secondary | ICD-10-CM

## 2016-08-24 NOTE — Patient Instructions (Addendum)
Bacillus Calmette-Guerin Live, BCG intravesical solution What is this medicine? BACILLUS CALMETTE-GUERIN LIVE, BCG (ba SIL us KAL met gay RAYN) is a bacteria solution. This medicine stimulates the immune system to ward off cancer cells. It is used to treat bladder cancer. This medicine may be used for other purposes; ask your health care provider or pharmacist if you have questions. COMMON BRAND NAME(S): Theracys, TICE BCG What should I tell my health care provider before I take this medicine? They need to know if you have any of these conditions: -aneurysm -blood in the urine -bladder biopsy within 2 weeks -fever or infection -immune system problems -leukemia -lymphoma -myasthenia gravis -need organ transplant -prosthetic device like arterial graft, artificial joint, prosthetic heart valve -recent or ongoing radiation therapy -tuberculosis -an unusual or allergic reaction to Bacillus Calmette-Guerin Live, BCG, latex, other medicines, foods, dyes, or preservatives -pregnant or trying to get pregnant -breast-feeding How should I use this medicine? This drug is given as a catheter infusion into the bladder. It is administered in a hospital or clinic by a specially trained health care professional. You will be given directions to follow before the treatment. Follow your doctor's directions carefully. Try to hold this medicine in your bladder for 2 hours after treatment. Talk to your pediatrician regarding the use of this medicine in children. Special care may be needed. Overdosage: If you think you have taken too much of this medicine contact a poison control center or emergency room at once. NOTE: This medicine is only for you. Do not share this medicine with others. What if I miss a dose? It is important not to miss your dose. Call your doctor or health care professional if you are unable to keep an appointment. What may interact with this medicine? -antibiotics -medicines to suppress  your immune system like chemotherapy agents or corticosteroids -medicine to treat tuberculosis This list may not describe all possible interactions. Give your health care provider a list of all the medicines, herbs, non-prescription drugs, or dietary supplements you use. Also tell them if you smoke, drink alcohol, or use illegal drugs. Some items may interact with your medicine. What should I watch for while using this medicine? Visit your doctor for checks on your progress. This drug may make you feel generally unwell. Contact your doctor if your symptoms last more than 2 days or if they get worse. Call your doctor right away if you have a severe or unusual symptom. Infection can be spread to others through contact with this medicine. To prevent the spread of infection follow your doctor's directions carefully after treatment. For the first 6 hours after each treatment, sit down on the toilet to urinate. After urinating, add 2 cups of bleach to the toilet bowl and let set for 15 minutes before flushing. Wash your hands before and after using the restroom. Drink water or other fluids as directed after treatment with this medicine. Do not become pregnant while taking this medicine. Women should inform their doctor if they wish to become pregnant or think they might be pregnant. There is a potential for serious side effects to an unborn child. Talk to your health care professional or pharmacist for more information. Do not breast-feed an infant while taking this medicine. What side effects may I notice from receiving this medicine? Side effects that you should report to your doctor or health care professional as soon as possible: -allergic reactions like skin rash, itching or hives, swelling of the face, lips, or tongue -signs of   notice from receiving this medicine?  Side effects that you should report to your doctor or health care professional as soon as possible:  -allergic reactions like skin rash, itching or hives, swelling of the face, lips, or tongue  -signs of infection - fever or chills, cough, sore throat, pain or difficulty passing urine  -signs of decreased red blood cells - unusually weak or tired, fainting spells,  lightheadedness  -blood in urine  -breathing problems  -cough  -eye pain, redness  -flu-like symptoms  -joint pain  -bladder-area pain for more than 2 days after treatment  -trouble passing urine or change in the amount of urine  -vomiting  -yellowing of the eyes or skin  Side effects that usually do not require medical attention (report to your doctor or health care professional if they continue or are bothersome):  -bladder spasm  -burning when passing urine within 2 days of treatment  -feel need to pass urine often or wake up at night to pass urine  -loss of appetite  This list may not describe all possible side effects. Call your doctor for medical advice about side effects. You may report side effects to FDA at 1-800-FDA-1088.  Where should I keep my medicine?  This drug is given in a hospital or clinic and will not be stored at home.  NOTE: This sheet is a summary. It may not cover all possible information. If you have questions about this medicine, talk to your doctor, pharmacist, or health care provider.   2018 Elsevier/Gold Standard (2015-05-20 10:33:35)

## 2016-08-24 NOTE — Progress Notes (Signed)
08/24/2016 3:24 PM   Kyle Gregory [birth year] [id number]  Referring provider: Rusty Aus, MD [facility] [place], [place] [address]  No chief complaint on file.   HPI: 76 year old male who presents today for follow-up of newly diagnosed bladder cancer. He was taken to the operating room on 08/14/2016 for a TURBT at which time an approximately 3 cm x 3 cm area of papillary tumor, broad base, carpeting involving the right lateral wall of the bladder with 2 smaller less than 1 cm satellite lesions. 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  Bladder cancer was identified and further workup for microscopic hematuria workup. CT abdomen pelvis with contrast showed thickening of the right posterior lateral bladder wall.  Bilateral retrograde pyelogram were performed in the operating room which were unremarkable.  He is not a smoker.   He is not exposed to secondhand smoke.  He worked with [employer] for a short time after high school.    He returns today to the office to discuss pathology and management.  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  He is voiding well. He denies any gross hematuria, dysuria, or any other abdominal pain, fevers, or chills. He does have some urinary urgency which seems to be improving.  PMH: Past Medical History:  Diagnosis Date  . Anemia   . Aortic stenosis   . Atrial fibrillation ([place])   . Cardiomyopathy ([place])   . Heartburn     Surgical History: Past Surgical History:  Procedure Laterality Date  . CYSTOSCOPY W/ RETROGRADES Bilateral 08/14/2016   Procedure: CYSTOSCOPY WITH RETROGRADE PYELOGRAM;  Surgeon: Hollice Espy, MD;  Location: [facility] ORS;  Service: Urology;  Laterality: Bilateral;  . neck growth removal     1970's  . TONSILLECTOMY    . TRANSURETHRAL RESECTION OF BLADDER TUMOR N/A 08/14/2016   Procedure: TRANSURETHRAL RESECTION OF BLADDER TUMOR (TURBT);  Surgeon: Hollice Espy, MD;  Location: [facility] ORS;  Service: Urology;  Laterality: N/A;    Home Medications:  Allergies as of 08/24/2016   No Known Allergies     Medication List       Accurate as of 08/24/16  3:24 PM. Always use your most recent med list.          furosemide 20 MG tablet Commonly known as:  LASIX Take 20 mg by mouth daily after breakfast.   lisinopril 10 MG tablet Commonly known as:  PRINIVIL,ZESTRIL Take 10 mg by mouth daily after breakfast.   metoprolol succinate 50 MG 24 hr tablet Commonly known as:  TOPROL-XL Take 50 mg by mouth daily after breakfast.   omeprazole 20 MG capsule Commonly known as:  PRILOSEC Take 20 mg by mouth daily before breakfast.   Vitamin B-12 5000 MCG Subl Place 5,000 mcg under the tongue daily after breakfast.   VITRON-C 65-125 MG Tabs Generic drug:  Iron-Vitamin C Take 1 tablet by mouth daily after breakfast.       Allergies: No Known Allergies  Family History: Family History  Problem Relation Age of Onset  . Heart failure Father   . Heart failure Brother   . Atrial fibrillation Sister   . Prostate cancer Neg Hx   . Kidney cancer Neg Hx   . Bladder Cancer Neg Hx     Social History:  reports that he has never smoked. He has never used smokeless tobacco. He reports that he does not  drink alcohol or use drugs.  ROS: UROLOGY Frequent Urination?: No Hard to postpone urination?: No Burning/pain with urination?: No Get up at night to urinate?: No Leakage of urine?: No Urine stream starts and stops?: No Trouble starting stream?: No Do you have to strain to urinate?: No Blood in urine?: No Urinary tract infection?: No Sexually transmitted disease?: No Injury to kidneys or bladder?: No Painful intercourse?: No Weak stream?: No Erection problems?: No Penile pain?: No  Gastrointestinal Nausea?: No Vomiting?: No Indigestion/heartburn?:  No Diarrhea?: No Constipation?: No  Constitutional Fever: No Night sweats?: No Weight loss?: No Fatigue?: No  Skin Skin rash/lesions?: No Itching?: No  Eyes Blurred vision?: No Double vision?: No  Ears/Nose/Throat Sore throat?: No Sinus problems?: No  Hematologic/Lymphatic Swollen glands?: No Easy bruising?: No  Cardiovascular Leg swelling?: No Chest pain?: No  Respiratory Cough?: No Shortness of breath?: No  Endocrine Excessive thirst?: No  Musculoskeletal Back pain?: No Joint pain?: No  Neurological Headaches?: No Dizziness?: No  Psychologic Depression?: No Anxiety?: No  Physical Exam: BP (!) 96/56   Pulse 79   Ht 6' (1.829 m)   Wt 246 lb (111.6 kg)   BMI 33.36 kg/m   Constitutional:  Alert and oriented, No acute distress.  Accompanied today by his wife and daughter. HEENT: [place] AT, moist mucus membranes.  Trachea midline, no masses. Cardiovascular: No clubbing, cyanosis, or edema. Respiratory: Normal respiratory effort, no increased work of breathing. GI: Abdomen is soft, nontender, nondistended, no abdominal masses GU: No CVA tenderness.  Skin: No rashes, bruises or suspicious lesions. Neurologic: Grossly intact, no focal deficits, moving all 4 extremities. Psychiatric: Normal mood and affect.  Laboratory Data: No results found for: WBC, HGB, HCT, MCV, PLT  Lab Results  Component Value Date   CREATININE 1.10 06/09/2016    Urinalysis    Component Value Date/Time   APPEARANCEUR Clear 07/18/2016 1034   GLUCOSEU Negative 07/18/2016 1034   BILIRUBINUR Negative 07/18/2016 1034   PROTEINUR Negative 07/18/2016 1034   NITRITE Negative 07/18/2016 1034   LEUKOCYTESUR Negative 07/18/2016 1034    Pertinent Imaging: CT abdomen and pelvis with contrast on 06/09/2016  Assessment & Plan: 76 year old male with high-grade TA TCC/CIS dx 07/2016.  Uppertract imaging unremarkable.    1. Malignant neoplasm of lateral wall of urinary bladder  ([place]) Options were reviewed today in detail. These include induction course of BCG followed by maintenance versus more aggressive cystectomy.  He is most interested in BCG therapy at this time. I explained the induction course with 6 intravesical treatments as well as the common side effects including dysuria, gross hematuria, irritative voiding symptoms, and risk of BCG sepsis. All discussions were answered in detail. He could benefit from maintenance if he has a complete response.  We will plan for cystoscopy, UA and urine cytology in 3 months thereafter. If there is anything suspicious, we will return to the operating room for confirmatory biopsies.  All questions were answered.  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  Hollice Espy, MD  [facility] [address] [place], [place] [address] [phone]  I spent 25 min with this patient of which greater than 50% was spent in counseling and coordination of care with the patient.

## 2016-09-03 ENCOUNTER — Other Ambulatory Visit: Payer: Self-pay | Admitting: Cardiology

## 2016-09-05 ENCOUNTER — Encounter
Admission: RE | Disposition: A | Discharge status: Home / Self Care | Payer: Self-pay | Source: Ambulatory Visit | Attending: Cardiology

## 2016-09-05 ENCOUNTER — Ambulatory Visit
Admission: RE | Admit: 2016-09-05 | Discharge: 2016-09-05 | Disposition: A | Discharge status: Home / Self Care | Payer: Medicare Other | Source: Ambulatory Visit | Attending: Cardiology | Admitting: Cardiology

## 2016-09-05 DIAGNOSIS — Z8249 Family history of ischemic heart disease and other diseases of the circulatory system: Secondary | ICD-10-CM | POA: Diagnosis not present

## 2016-09-05 DIAGNOSIS — I420 Dilated cardiomyopathy: Secondary | ICD-10-CM | POA: Diagnosis present

## 2016-09-05 DIAGNOSIS — I700 Atherosclerosis of aorta: Secondary | ICD-10-CM | POA: Insufficient documentation

## 2016-09-05 DIAGNOSIS — I2510 Atherosclerotic heart disease of native coronary artery without angina pectoris: Secondary | ICD-10-CM | POA: Diagnosis not present

## 2016-09-05 DIAGNOSIS — D090 Carcinoma in situ of bladder: Secondary | ICD-10-CM | POA: Diagnosis not present

## 2016-09-05 DIAGNOSIS — I482 Chronic atrial fibrillation, unspecified: Secondary | ICD-10-CM | POA: Diagnosis present

## 2016-09-05 DIAGNOSIS — I2582 Chronic total occlusion of coronary artery: Secondary | ICD-10-CM | POA: Insufficient documentation

## 2016-09-05 DIAGNOSIS — Z7901 Long term (current) use of anticoagulants: Secondary | ICD-10-CM | POA: Insufficient documentation

## 2016-09-05 HISTORY — PX: LEFT HEART CATH AND CORONARY ANGIOGRAPHY: CATH118249

## 2016-09-05 SURGERY — LEFT HEART CATH AND CORONARY ANGIOGRAPHY
Anesthesia: Moderate Sedation | Laterality: Left

## 2016-09-05 SURGERY — LEFT HEART CATH AND CORONARY ANGIOGRAPHY
Anesthesia: Moderate Sedation

## 2016-09-05 MED ORDER — MIDAZOLAM HCL 2 MG/2ML IJ SOLN
INTRAMUSCULAR | Status: DC | PRN
  Administered 2016-09-05: 1 mg via INTRAVENOUS

## 2016-09-05 MED ORDER — ASPIRIN 81 MG PO CHEW: 81.0000 mg | CHEWABLE_TABLET | ORAL | 6 refills | Status: DC

## 2016-09-05 MED ORDER — FENTANYL CITRATE (PF) 100 MCG/2ML IJ SOLN
INTRAMUSCULAR | Status: DC | PRN
  Administered 2016-09-05: 25 ug via INTRAVENOUS

## 2016-09-05 MED ORDER — SODIUM CHLORIDE FLUSH 0.9 % IV SOLN
INTRAVENOUS | Status: AC
  Filled 2016-09-05: qty 10

## 2016-09-05 MED ORDER — SODIUM CHLORIDE 0.9% FLUSH: 3.0000 mL | INTRAVENOUS | Status: DC | PRN

## 2016-09-05 MED ORDER — MIDAZOLAM HCL 2 MG/2ML IJ SOLN
INTRAMUSCULAR | Status: AC
  Filled 2016-09-05: qty 2

## 2016-09-05 MED ORDER — IOPAMIDOL (ISOVUE-300) INJECTION 61%
INTRAVENOUS | Status: DC | PRN
  Administered 2016-09-05: 110 mL via INTRA_ARTERIAL

## 2016-09-05 MED ORDER — SODIUM CHLORIDE 0.9 % IV SOLN: 250.0000 mL | INTRAVENOUS | Status: DC | PRN

## 2016-09-05 MED ORDER — SODIUM CHLORIDE 0.9 % IV SOLN
INTRAVENOUS | Status: DC
  Administered 2016-09-05: 07:00:00 via INTRAVENOUS

## 2016-09-05 MED ORDER — ASPIRIN 81 MG PO CHEW: 81.0000 mg | CHEWABLE_TABLET | ORAL | Status: DC

## 2016-09-05 MED ORDER — SODIUM CHLORIDE 0.9% FLUSH: 3.0000 mL | Freq: Two times a day (BID) | INTRAVENOUS | Status: DC

## 2016-09-05 MED ORDER — FENTANYL CITRATE (PF) 100 MCG/2ML IJ SOLN
INTRAMUSCULAR | Status: AC
Start: 2016-09-05 — End: 2016-09-05
  Filled 2016-09-05: qty 2

## 2016-09-05 SURGICAL SUPPLY — 9 items
CATH INFINITI 5FR ANG PIGTAIL (CATHETERS) ×3 IMPLANT
CATH INFINITI 5FR JL4 (CATHETERS) ×3 IMPLANT
CATH INFINITI JR4 5F (CATHETERS) ×3 IMPLANT
DEVICE CLOSURE MYNXGRIP 5F (Vascular Products) ×3 IMPLANT
GUIDEWIRE 3MM J TIP .035 145 (WIRE) ×3 IMPLANT
KIT MANI 3VAL PERCEP (MISCELLANEOUS) ×3 IMPLANT
NEEDLE PERC 18GX7CM (NEEDLE) ×3 IMPLANT
PACK CARDIAC CATH (CUSTOM PROCEDURE TRAY) ×3 IMPLANT
SHEATH AVANTI 5FR X 11CM (SHEATH) ×3 IMPLANT

## 2016-09-05 NOTE — Progress Notes (Signed)
Dr. Ubaldo Glassing at bedside discussing cath results with pt. And family.

## 2016-09-05 NOTE — H&P (Signed)
Chief Complaint: Chief Complaint  Patient presents with  . Follow-up  was told to come back after my bladder surgery --they removed a mass Kyle Gregory -----I feel okay no problems  . other  will stat the BCG tx on may 16 for 6 weeks  Date of Service: 08/30/2016 Date of Birth: [birth year] PCP: Rusty Aus, MD  History of Present Illness: Kyle Gregory is a 76 y.o.male patient who presents for evaluation he is status post resection of carcinoma in situ in the bladder and is on BCG therapy. He was previously seen to evaluate atrial fibrillation, cardiomyopathy and chest discomfort. T He had an EKG done 2 months ago which revealed atrial fibrillation with fairly controlled ventricular response. He was treated with metoprolol. Echo was done a week later which was read as showing reduced LV function with an EF of 30%. Patient does complain of some exertional fatigue. Patient underwent a functional study prior to her surgery which showed inferoseptal fixed defect with trivial if any reversibility. He continues to have dyspnea. Etiology of his cardiomyopathy is unclear but appears to be ischemic. Prior to starting chronic anticoagulation for his atrial fibrillation, will evaluate his coronary anatomy to guide further therapy. Will defer chronic anticoagulation until cath can be completed. Remains in atrial fibrillation  Past Medical and Surgical History  Past Medical History Past Medical History:  Diagnosis Date  . Atrial fibrillation, chronic (CMS-HCC) 06/09/2016  . Dilated cardiomyopathy (CMS-HCC) 06/09/2016   Past Surgical History He has a past surgical history that includes neck cyst removal.   Medications and Allergies  Current Medications  Current Outpatient Prescriptions  Medication Sig Dispense Refill  . CYANOCOBALAMIN/COBAMAMIDE (B12 SL) Place 500 mcg under the tongue once daily.  . ferrous fumarate-vitamin C (VITRON-C) 65 mg iron- 125 mg tablet Take 1 tablet by mouth once daily.  .  FUROsemide (LASIX) 20 MG tablet TAKE 1 TABLET BY MOUTH EVERY DAY 30 tablet 1  . lisinopril (PRINIVIL,ZESTRIL) 10 MG tablet Take 1 tablet (10 mg total) by mouth once daily. 30 tablet 11  . metoprolol succinate (TOPROL-XL) 50 MG XL tablet TAKE 1 TABLET BY MOUTH EVERY MORNING 30 tablet 1  . omeprazole (PRILOSEC) 20 MG DR capsule Take 20 mg by mouth once daily.   No current facility-administered medications for this visit.   Allergies: Patient has no known allergies.  Social and Family History  Social History reports that he has never smoked. He has never used smokeless tobacco.  Family History Family History  Problem Relation Age of Onset  . Alzheimer's disease Mother  . Coronary Artery Disease (Blocked arteries around heart) Father  . Coronary Artery Disease (Blocked arteries around heart) Brother  . Coronary Artery Disease (Blocked arteries around heart) Brother   Review of Systems  Review of Systems  Constitutional: Positive for malaise/fatigue. Negative for chills, diaphoresis, fever and weight loss.  HENT: Negative for congestion, ear discharge, hearing loss and tinnitus.  Eyes: Negative for blurred vision.  Respiratory: Positive for shortness of breath. Negative for cough, hemoptysis, sputum production and wheezing.  Cardiovascular: Negative for chest pain, palpitations, orthopnea, claudication, leg swelling and PND.  Gastrointestinal: Positive for heartburn. Negative for abdominal pain, blood in stool, constipation, diarrhea, melena, nausea and vomiting.  Genitourinary: Positive for hematuria. Negative for dysuria, frequency and urgency.  Musculoskeletal: Negative for back pain, falls, joint pain and myalgias.  Skin: Negative for itching and rash.  Neurological: Negative for dizziness, tingling, focal weakness, loss of consciousness, weakness and headaches.  Endo/Heme/Allergies: Negative for polydipsia. Does not bruise/bleed easily.  Psychiatric/Behavioral: Negative for  depression, memory loss and substance abuse. The patient is not nervous/anxious.   Physical Examination   Vitals:BP 132/70 (BP Location: Left upper arm, Patient Position: Sitting, BP Cuff Size: Adult)  Pulse 62  Resp 12  Ht 182.9 cm (6')  Wt (!) 112.1 kg (247 lb 3.2 oz)  BMI 33.53 kg/m  Ht:182.9 cm (6') Wt:(!) 112.1 kg (247 lb 3.2 oz) QBH:ALPF surface area is 2.39 meters squared. Body mass index is 33.53 kg/m.  Wt Readings from Last 3 Encounters:  08/30/16 (!) 112.1 kg (247 lb 3.2 oz)  08/04/16 (!) 112 kg (247 lb)  07/17/16 (!) 113.4 kg (250 lb)   BP Readings from Last 3 Encounters:  08/30/16 132/70  08/04/16 130/70  07/17/16 124/77   General appearance appears in no acute distress  Head Mouth and Eye exam Normocephalic, without obvious abnormality, atraumatic Dentition is good Eyes appear anicteric   Neck exam Thyroid: normal  Nodes: no obvious adenopathy  LUNGS Breath Sounds: Normal Percussion: Normal  CARDIOVASCULAR JVP CV wave: no HJR: no Elevation at 90 degrees: None Carotid Pulse: normal pulsation bilaterally Bruit: None Apex: apical impulse normal  Auscultation Rhythm: atrial fibrillation S1: normal S2: normal Clicks: no Rub: no Murmurs: 1/6 medium pitched mid systolic blowing at lower left sternal border  Gallop: None ABDOMEN Liver enlargement: no Pulsatile aorta: no Ascites: no Bruits: no  EXTREMITIES Clubbing: no Edema: 1+ bilateral pedal edema Pulses: peripheral pulses symmetrical Femoral Bruits: no Amputation: no SKIN Rash: no Cyanosis: no Embolic phemonenon: no Bruising: no NEURO Alert and Oriented to person, place and time: yes Non focal: yes  PSYCH: Pt appears to have normal affect  LABS REVIEWED Last 3 CBC results: Lab Results  Component Value Date  WBC 7.2 08/30/2016  WBC 6.4 07/05/2016  WBC 6.5 06/09/2016   Lab Results  Component Value Date  HGB 14.8 08/30/2016  HGB 12.4 (L) 07/05/2016  HGB 10.2 (L)  06/09/2016   Lab Results  Component Value Date  HCT 45.8 08/30/2016  HCT 40.4 07/05/2016  HCT 33.0 (L) 06/09/2016   Lab Results  Component Value Date  PLT 172 08/30/2016  PLT 221 07/05/2016  PLT 188 06/09/2016   Lab Results  Component Value Date  CREATININE 1.1 06/09/2016  BUN 20 06/09/2016  NA 141 06/09/2016  K 4.4 06/09/2016  CL 104 06/09/2016  CO2 30.0 06/09/2016   Lab Results  Component Value Date  HGBA1C 5.6 06/09/2016   Lab Results  Component Value Date  HDL 38.5 06/09/2016   Lab Results  Component Value Date  LDLCALC 88 06/09/2016   Lab Results  Component Value Date  TRIG 79 06/09/2016   Lab Results  Component Value Date  ALT 26 07/17/2016  AST 28 07/17/2016  ALKPHOS 61 07/17/2016   Lab Results  Component Value Date  TSH 1.503 06/09/2016   Assessment and Plan   76 y.o. male with  ICD-10-CM ICD-9-CM  1. Atrial fibrillation, chronic (CMS-HCC)-newly noted however unclear how long it is been present. Patient is asymptomatic from this and has not had an electrocardiogram in the past. Etiology may be secondary to his cardiomyopathy however cardia myopathy may be secondary to atrial fibrillation with tachycardia induced cardiomyopathy. Rate is in better control with metoprolol. Patient is being worked up for dysphagia as well as hematuria. Not a candidate for anticoagulation at present until this can be completed. His chads score is 2 for age and at least one  for cardiomyopathy. Will remain off of anticoagulation as mentioned above until cardiac catheterization is completed. I48.2 427.31  2. Aortic atherosclerosis (CMS-HCC)-we will follow. Carotid Doppler showed no critical disease. I70.0 440.0  3. Aortic stenosis, mild-no high-grade disease. I35.0 424.1  4. Cardiomyopathy, dilated (CMS-HCC)-etiology unclear. May be rate related versus idiopathic versus ischemic. We will proceed with a left heart cath to evaluate coronary anatomy to guide further therapy.  Patient no longer has hematuria or dark stools. I42.0 425.4 NM myocardial perfusion SPECT multiple (stress and rest)  ECG stress test only   Return in about 2 weeks (around 09/13/2016).  These notes generated with voice recognition software. I apologize for typographical errors.  Sydnee Levans, MD   Pt seen and examined. No change from above.

## 2016-09-12 NOTE — Progress Notes (Signed)
09/14/2016 2:11 PM   Kyle Gregory [birth year] [id number]  Referring provider: Rusty Aus, MD [facility] [place], [place] [address]  Chief Complaint  Patient presents with  . Bladder Cancer    BCG  1     HPI: 76 yo WM who presents today to begin his induction course of 6 weekly treatments of BCG.  This will be #1/6.  He is here with his wife, Kyle Gregory and daughter  Background  history 76 year old male who presents today for follow-up of newly diagnosed bladder cancer. He was taken to the operating room on 08/14/2016 for a TURBT at which time an approximately 3 cm x 3 cm area of papillary tumor, broad base, carpeting involving the right lateral wall of the bladder with 2 smaller less than 1 cm satellite lesions. 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  Bladder cancer was identified and further workup for microscopic hematuria workup. CT abdomen pelvis with contrast showed thickening of the right posterior lateral bladder wall.  Bilateral retrograde pyelogram were performed in the operating room which were unremarkable.  He is not a smoker.   He is not exposed to secondhand smoke.  He worked with [employer] for a short time after high school.  He returns today to the office to discuss pathology and management.  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  He has read and signed the consent.  His questions have been answered and he is ready to proceed.    Today, he is complaining of nocturia.  This is baseline.  His UA today demonstrates 11-30 WBC's, 3-10 RBC's and moderate bacteria.  He denies dysuria, gross hematuria and suprapubic pain. He denies fever, chills, nausea and vomiting.     PMH: Past Medical History:  Diagnosis Date  . Anemia   . Aortic stenosis   . Atrial fibrillation ([place])   . Cardiomyopathy ([place])   . Heartburn      Surgical History: Past Surgical History:  Procedure Laterality Date  . CYSTOSCOPY W/ RETROGRADES Bilateral 08/14/2016   Procedure: CYSTOSCOPY WITH RETROGRADE PYELOGRAM;  Surgeon: Hollice Espy, MD;  Location: [facility] OR[facility];  Service: Urology;  Laterality: Bilateral;  . LEFT HEART CATH AND CORONARY ANGIOGRAPHY Left 09/05/2016   Procedure: Left Heart Cath and Coronary Angiography;  Surgeon: Teodoro Spray, MD;  Location: [facility];  Service: Cardiovascular;  Laterality: Left;  . neck growth removal     1970's  . TONSILLECTOMY    . TRANSURETHRAL RESECTION OF BLADDER TUMOR N/A 08/14/2016   Procedure: TRANSURETHRAL RESECTION OF BLADDER TUMOR (TURBT);  Surgeon: Hollice Espy, MD;  Location: [facility] ORS;  Service: Urology;  Laterality: N/A;    Home Medications:  Allergies as of 09/14/2016   No Known Allergies     Medication List       Accurate as of 09/14/16  2:11 PM. Always use your most recent med list.          aspirin 81 MG chewable tablet Chew 1 tablet (81 mg total) by mouth before cath procedure.   furosemide 20 MG tablet Commonly known as:  LASIX Take 20 mg by mouth daily after breakfast.   lisinopril 10 MG tablet Commonly known as:  PRINIVIL,ZESTRIL Take 10 mg by mouth daily after breakfast.   metoprolol succinate 50 MG 24 hr tablet Commonly known as:  TOPROL-XL Take 50 mg by mouth daily after breakfast.  omeprazole 20 MG capsule Commonly known as:  PRILOSEC Take 20 mg by mouth daily before breakfast.   Vitamin B-12 5000 MCG Subl Place 5,000 mcg under the tongue daily after breakfast.   VITRON-C 65-125 MG Tabs Generic drug:  Iron-Vitamin C Take 1 tablet by mouth daily after breakfast.       Allergies: No Known Allergies  Family History: Family History  Problem Relation Age of Onset  . Heart failure Father   . Heart failure Brother   . Atrial fibrillation Sister   . Prostate cancer Neg Hx   . Kidney cancer Neg Hx   . Bladder Cancer Neg Hx      Social History:  reports that he has never smoked. He has never used smokeless tobacco. He reports that he does not drink alcohol or use drugs.  ROS: UROLOGY Frequent Urination?: No Hard to postpone urination?: No Burning/pain with urination?: No Get up at night to urinate?: Yes Leakage of urine?: No Urine stream starts and stops?: No Trouble starting stream?: No Do you have to strain to urinate?: No Blood in urine?: No Urinary tract infection?: No Sexually transmitted disease?: No Injury to kidneys or bladder?: No Painful intercourse?: No Weak stream?: No Erection problems?: No Penile pain?: No  Gastrointestinal Nausea?: No Vomiting?: No Indigestion/heartburn?: No Diarrhea?: No Constipation?: No  Constitutional Fever: No Night sweats?: No Weight loss?: No Fatigue?: No  Skin Skin rash/lesions?: No Itching?: No  Eyes Blurred vision?: No Double vision?: No  Ears/Nose/Throat Sore throat?: No Sinus problems?: No  Hematologic/Lymphatic Swollen glands?: No Easy bruising?: No  Cardiovascular Leg swelling?: No Chest pain?: No  Respiratory Cough?: No Shortness of breath?: No  Endocrine Excessive thirst?: No  Musculoskeletal Back pain?: No Joint pain?: No  Neurological Headaches?: No Dizziness?: No  Psychologic Depression?: No Anxiety?: No  Physical Exam: BP 136/77   Pulse 77   Ht 6' (1.829 m)   Wt 242 lb 6.4 oz (110 kg)   BMI 32.88 kg/m   Constitutional:  Alert and oriented, No acute distress.  Accompanied today by his wife and daughter.  HEENT: [place] AT, moist mucus membranes.  Trachea midline, no masses. Cardiovascular: No clubbing, cyanosis, or edema. Respiratory: Normal respiratory effort, no increased work of breathing. GI: Abdomen is soft, nontender, nondistended, no abdominal masses GU: No CVA tenderness.  Skin: No rashes, bruises or suspicious lesions. Neurologic: Grossly intact, no focal deficits, moving all 4  extremities. Psychiatric: Normal mood and affect.  Laboratory Data:  Lab Results  Component Value Date   CREATININE 1.10 06/09/2016    Urinalysis 11-30 WBCs. 3-10 rbc's. Moderate bacteria.  See EPIC.  This is baseline.    Pertinent Imaging: CT abdomen and pelvis with contrast on 06/09/2016  Assessment & Plan: 76 year old male with high-grade TA TCC/CIS dx 07/2016.  Uppertract imaging unremarkable.    1. Malignant neoplasm of lateral wall of urinary bladder (HCC)  - Reviewed BCG treatment course, possible side effects including BCG sepsis, bladder irritation, worsening of her urinary symptoms  - # 1 of 6 BCG installed today - he tolerated 30 minutes in the office today  - Patient was instructed to pour bleach down his/her toilet for the next 6 hours  - Reminded patient of common side effects of dysuria, gross hematuria, irritative voiding symptoms and risk of BCG sepsis  -  Instructed to call the office if he should experience fevers greater than 102, chills/rigors, onset of a new cough, night sweats or further bladder spasms or inability  to urinate   - RTC in one week for # 2 BCG  - Surveillance protocol also discussed today including cystoscopy, UA and urine cytology every 3 months, with returning to the OR for confirmatory biopsies if anything suspicious is found for at least 2 years and then spread out thereafter  - May benefit from maintenance BCG if has complete response  - Urinalysis, Complete  - bcg vaccine injection 81 mg; Instill 3.24 mLs (81 mg total) into the bladder once.  - lidocaine (XYLOCAINE) 2 % jelly 1 application; Place 1 application into the urethra once.  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  Zara Council, [facility] [address] [place], [place] [address] [phone]

## 2016-09-14 ENCOUNTER — Encounter: Payer: Self-pay | Admitting: Urology

## 2016-09-14 ENCOUNTER — Ambulatory Visit (INDEPENDENT_AMBULATORY_CARE_PROVIDER_SITE_OTHER): Payer: Medicare Other | Admitting: Urology

## 2016-09-14 ENCOUNTER — Telehealth: Payer: Self-pay | Admitting: Urology

## 2016-09-14 VITALS — BP 136/77 | HR 77 | Ht 72.0 in | Wt 242.4 lb

## 2016-09-14 DIAGNOSIS — I6523 Occlusion and stenosis of bilateral carotid arteries: Secondary | ICD-10-CM | POA: Diagnosis not present

## 2016-09-14 DIAGNOSIS — D090 Carcinoma in situ of bladder: Secondary | ICD-10-CM

## 2016-09-14 DIAGNOSIS — C679 Malignant neoplasm of bladder, unspecified: Secondary | ICD-10-CM | POA: Diagnosis not present

## 2016-09-14 LAB — URINALYSIS, COMPLETE
Bilirubin, UA: NEGATIVE
GLUCOSE, UA: NEGATIVE
Ketones, UA: NEGATIVE
Nitrite, UA: NEGATIVE
Specific Gravity, UA: 1.025 (ref 1.005–1.030)
UUROB: 0.2 mg/dL (ref 0.2–1.0)
pH, UA: 5 (ref 5.0–7.5)

## 2016-09-14 LAB — MICROSCOPIC EXAMINATION

## 2016-09-14 MED ORDER — LIDOCAINE HCL 2 % EX GEL
1.0000 "application " | Freq: Once | CUTANEOUS | Status: AC
  Administered 2016-09-14: 1 via URETHRAL

## 2016-09-14 MED ORDER — BCG LIVE 50 MG IS SUSR
3.2400 mL | Freq: Once | INTRAVESICAL | Status: AC
  Administered 2016-09-14: 81 mg via INTRAVESICAL

## 2016-09-14 NOTE — Telephone Encounter (Signed)
Patient's wife called the office this morning to inquire about starting the BCG treatments today.  Patient is scheduled to see a cardiac surgeon at [facility] on Tuesday to talk about by-pass surgery.  She was concerned about starting the treatment today.  I spoke to Dr. Erlene Quan and she advised that the patient should go ahead and start BCG treatments today and will will see what happens with scheduling the cardiac surgery.  While it is not ideal to interrupt the BCG treatments, she feels that it is important to go ahead and start them today.  Patient's wife expressed understanding.  She did request information about the treatments to take with them to his appointment with the cardiology surgeon.

## 2016-09-14 NOTE — Progress Notes (Signed)
BCG Bladder Instillation  BCG # 1  Due to Bladder Cancer patient is present today for a BCG treatment. Patient was cleaned and prepped in a sterile fashion with betadine and lidocaine 2% jelly was instilled into the urethra.  A 14FR catheter was inserted, urine return was noted 132ml, urine was yellow in color.  54ml of reconstituted BCG was instilled into the bladder. The catheter was then removed. Patient tolerated well, no complications were noted.  Preformed by: Zara Council PA-C and Lyndee Hensen CMA  Follow up/ Additional notes: One Week

## 2016-09-21 ENCOUNTER — Encounter: Payer: Self-pay | Admitting: Urology

## 2016-09-21 ENCOUNTER — Ambulatory Visit (INDEPENDENT_AMBULATORY_CARE_PROVIDER_SITE_OTHER): Payer: Medicare Other | Admitting: Urology

## 2016-09-21 VITALS — BP 104/63 | HR 87 | Ht 72.0 in | Wt 246.0 lb

## 2016-09-21 DIAGNOSIS — C679 Malignant neoplasm of bladder, unspecified: Secondary | ICD-10-CM | POA: Diagnosis not present

## 2016-09-21 LAB — MICROSCOPIC EXAMINATION
BACTERIA UA: NONE SEEN
RBC, UA: NONE SEEN /hpf (ref 0–?)

## 2016-09-21 LAB — URINALYSIS, COMPLETE
BILIRUBIN UA: NEGATIVE
Glucose, UA: NEGATIVE
Ketones, UA: NEGATIVE
Nitrite, UA: NEGATIVE
PH UA: 6 (ref 5.0–7.5)
PROTEIN UA: NEGATIVE
Specific Gravity, UA: <1.005 — ABNORMAL LOW (ref 1.005–1.030)
Urobilinogen, Ur: 0.2 mg/dL (ref 0.2–1.0)

## 2016-09-21 MED ORDER — BCG LIVE 50 MG IS SUSR
3.2400 mL | Freq: Once | INTRAVESICAL | Status: AC
  Administered 2016-09-21: 81 mg via INTRAVESICAL

## 2016-09-21 NOTE — Progress Notes (Signed)
BCG Bladder Instillation  BCG # 2  Due to Bladder Cancer patient is present today for a BCG treatment. Patient was cleaned and prepped in a sterile fashion with betadine and lidocaine 2% jelly was instilled into the urethra.  A 14FR catheter was inserted, urine return was noted 74ml, urine was yellow in color.  10ml of reconstituted BCG was instilled into the bladder. The catheter was then removed. Patient tolerated well, no complications were noted  Preformed by: Elberta Leatherwood, CMA, Baruch Gouty, MD  Follow up/ Additional notes: He will be out for the next 2 weeks due to surgery. We added the BCG treatments on to the last treatment scheduled.

## 2016-09-28 ENCOUNTER — Ambulatory Visit: Payer: Medicare Other | Admitting: Urology

## 2016-10-01 DIAGNOSIS — Z953 Presence of xenogenic heart valve: Secondary | ICD-10-CM | POA: Insufficient documentation

## 2016-10-03 DIAGNOSIS — I639 Cerebral infarction, unspecified: Secondary | ICD-10-CM | POA: Insufficient documentation

## 2016-10-05 ENCOUNTER — Ambulatory Visit: Payer: Medicare Other | Admitting: Urology

## 2016-10-10 ENCOUNTER — Telehealth: Payer: Self-pay | Admitting: Urology

## 2016-10-10 NOTE — Telephone Encounter (Signed)
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  She requested to cancel BCG for next 2 treatments.  He is unable to walk at present and has slurred speech.  Please advise for future BCG treatments.

## 2016-10-10 NOTE — Telephone Encounter (Signed)
Please advise thanks.

## 2016-10-11 NOTE — Telephone Encounter (Signed)
I would recommend resuming treatment as soon as he is able to get here safetly Ideally, the least amount of interruption is best but certainly understand he has other medical issues which take priority at this time. Please reschedule for when he feels comfortable coming into the office to resume treatment 3-6.    Hollice Espy, MD

## 2016-10-11 NOTE — Telephone Encounter (Signed)
Please advise 

## 2016-10-12 ENCOUNTER — Ambulatory Visit: Payer: Medicare Other

## 2016-10-12 NOTE — Telephone Encounter (Signed)
Spoke with his wife today and he started rehab but started having chest pains and was moved to the heart floor at [facility]. She said he is not doing good and they would call when and if he goes home and is able to resume treatments.  [name]

## 2016-10-12 NOTE — Telephone Encounter (Signed)
Spoke with patient's wife and she states that patient is still inpatient at [facility] and they were attempting physical therapy this morning and patient starting having chest pain. He has now been transferred back to the Cardio floor. She does not expect him to be released for probably 2 more weeks or so. She will call the office when he is discharged to discuss scheduling BCG treatments.

## 2016-10-19 ENCOUNTER — Ambulatory Visit: Payer: Medicare Other | Admitting: Urology

## 2016-10-26 ENCOUNTER — Ambulatory Visit: Payer: Medicare Other | Admitting: Urology

## 2016-10-28 DIAGNOSIS — A4901 Methicillin susceptible Staphylococcus aureus infection, unspecified site: Secondary | ICD-10-CM | POA: Insufficient documentation

## 2016-11-02 ENCOUNTER — Ambulatory Visit: Payer: Medicare Other | Admitting: Urology

## 2016-11-09 ENCOUNTER — Ambulatory Visit: Payer: Medicare Other

## 2016-11-20 ENCOUNTER — Ambulatory Visit: Admit: 2016-11-20 | Payer: Medicare Other | Admitting: Gastroenterology

## 2016-11-20 SURGERY — COLONOSCOPY WITH PROPOFOL
Anesthesia: General

## 2017-01-25 ENCOUNTER — Other Ambulatory Visit: Payer: Medicare Other

## 2017-02-02 ENCOUNTER — Other Ambulatory Visit: Payer: Medicare Other

## 2017-03-15 ENCOUNTER — Encounter: Payer: Self-pay | Admitting: Urology

## 2017-03-15 ENCOUNTER — Ambulatory Visit (INDEPENDENT_AMBULATORY_CARE_PROVIDER_SITE_OTHER): Payer: Medicare Other | Admitting: Urology

## 2017-03-15 VITALS — BP 104/59 | HR 45 | Ht 72.0 in | Wt 204.0 lb

## 2017-03-15 DIAGNOSIS — N401 Enlarged prostate with lower urinary tract symptoms: Secondary | ICD-10-CM

## 2017-03-15 DIAGNOSIS — N138 Other obstructive and reflux uropathy: Secondary | ICD-10-CM

## 2017-03-15 DIAGNOSIS — R972 Elevated prostate specific antigen [PSA]: Secondary | ICD-10-CM

## 2017-03-15 NOTE — Progress Notes (Signed)
03/15/2017 2:53 PM   [name] 12/12/1940 [id number]  Referring provider: Rusty Aus, MD [place] [place], [place] [address]  Chief Complaint  Patient presents with  . Bladder Cancer    HPI: 76 year old male presents for follow-up of bladder cancer.  He underwent TURBT on 08/14/2016 with Dr. Erlene Quan for a 3 cm bladder tumor with pathology returning high-grade Ta urothelial carcinoma with CIS.  Induction BCG was recommended which he started May 2018.  He received 3 treatments and due to worsening cardiac status underwent three-vessel CABG and aortic valve replacement at [facility] in June 2018.  He had an extended hospital course complicated by CVA, atrial fibrillation and a sternotomy wound infection.  He was discharged to a rehab facility.  He had an indwelling Foley placed while hospitalized which was apparently removed and replaced on a few occasions.  His catheter has been indwelling since he has been in the rehab facility.  He also has a gastrostomy tube.   PMH: Past Medical History:  Diagnosis Date  . Anemia   . Aortic stenosis   . Atrial fibrillation ([place])   . Cardiomyopathy ([place])   . Heartburn     Surgical History: Past Surgical History:  Procedure Laterality Date  . CYSTOSCOPY W/ RETROGRADES Bilateral 08/14/2016   Procedure: CYSTOSCOPY WITH RETROGRADE PYELOGRAM;  Surgeon: Hollice Espy, MD;  Location: [facility] ORS;  Service: Urology;  Laterality: Bilateral;  . LEFT HEART CATH AND CORONARY ANGIOGRAPHY Left 09/05/2016   Procedure: Left Heart Cath and Coronary Angiography;  Surgeon: Teodoro Spray, MD;  Location: [place] [facility];  Service: Cardiovascular;  Laterality: Left;  . neck growth removal     1970's  . TONSILLECTOMY    . TRANSURETHRAL RESECTION OF BLADDER TUMOR N/A 08/14/2016   Procedure: TRANSURETHRAL RESECTION OF BLADDER TUMOR (TURBT);  Surgeon: Hollice Espy, MD;  Location: [facility] ORS;  Service: Urology;   Laterality: N/A;    Home Medications:  Allergies as of 03/15/2017   No Known Allergies     Medication List        Accurate as of 03/15/17  2:53 PM. Always use your most recent med list.          amiodarone 200 MG tablet Commonly known as:  PACERONE Take by mouth.   ASPIRIN 81 PO Take by mouth.   atorvastatin 80 MG tablet Commonly known as:  LIPITOR Take by mouth.   FLUoxetine 20 MG capsule Commonly known as:  PROZAC Take by mouth.   furosemide 20 MG tablet Commonly known as:  LASIX Take 20 mg by mouth daily after breakfast.   lidocaine 5 % Commonly known as:  [place] onto the skin.   lisinopril 10 MG tablet Commonly known as:  PRINIVIL,ZESTRIL Take 10 mg by mouth daily after breakfast.   metoprolol tartrate 25 MG tablet Commonly known as:  LOPRESSOR Take by mouth.   nitroGLYCERIN 0.4 MG SL tablet Commonly known as:  NITROSTAT Place under the tongue.   omeprazole 20 MG capsule Commonly known as:  PRILOSEC Take 20 mg by mouth daily before breakfast.   OYSTER SHELL CALCIUM 500 + D 500-125 MG-UNIT Tabs Generic drug:  Calcium Carbonate-Vitamin D Take by mouth.   polyethylene glycol packet Commonly known as:  MIRALAX / GLYCOLAX Take by mouth.   PROMOD Liqd Take by mouth.   senna-docusate 8.6-50 MG tablet Commonly known as:  Senokot-S Take by mouth.   Vitamin B-12 5000 MCG Subl Place 5,000  mcg under the tongue daily after breakfast.   VITRON-C 65-125 MG Tabs Generic drug:  Iron-Vitamin C Take 1 tablet by mouth daily after breakfast.   warfarin 5 MG tablet Commonly known as:  COUMADIN Take by mouth.       Allergies: No Known Allergies  Family History: Family History  Problem Relation Age of Onset  . Heart failure Father   . Heart failure Brother   . Atrial fibrillation Sister   . Prostate cancer Neg Hx   . Kidney cancer Neg Hx   . Bladder Cancer Neg Hx     Social History:  reports that  has never smoked. he has never used  smokeless tobacco. He reports that he does not drink alcohol or use drugs.  ROS: UROLOGY Frequent Urination?: No Hard to postpone urination?: No Burning/pain with urination?: No Get up at night to urinate?: No Leakage of urine?: No Urine stream starts and stops?: No Trouble starting stream?: No Do you have to strain to urinate?: No Blood in urine?: No Urinary tract infection?: No Sexually transmitted disease?: No Injury to kidneys or bladder?: No Painful intercourse?: No Weak stream?: No Erection problems?: No Penile pain?: No  Gastrointestinal Nausea?: No Vomiting?: No Indigestion/heartburn?: No Diarrhea?: No Constipation?: No  Constitutional Fever: No Night sweats?: No Weight loss?: No Fatigue?: No  Skin Skin rash/lesions?: No Itching?: No  Eyes Blurred vision?: No Double vision?: No  Ears/Nose/Throat Sore throat?: No Sinus problems?: No  Hematologic/Lymphatic Swollen glands?: No Easy bruising?: No  Cardiovascular Leg swelling?: No Chest pain?: No  Respiratory Cough?: No Shortness of breath?: No  Endocrine Excessive thirst?: No  Musculoskeletal Back pain?: No Joint pain?: No  Neurological Headaches?: No Dizziness?: No  Psychologic Depression?: No Anxiety?: No  Physical Exam: BP (!) 104/59   Pulse (!) 45   Ht 6' (1.829 m)   Wt 204 lb (92.5 kg)   BMI 27.67 kg/m   Constitutional:  Alert and oriented, No acute distress. HEENT: [place] AT, moist mucus membranes.  Trachea midline, no masses. Cardiovascular: No clubbing, cyanosis, or edema. Respiratory: Normal respiratory effort, no increased work of breathing. GI: Abdomen is soft, nontender, nondistended, no abdominal masses G-tube present. GU: No CVA tenderness. Foley catheter draining clear urine Skin: No rashes, bruises or suspicious lesions. Lymph: No cervical or inguinal adenopathy. Neurologic: Grossly intact, no focal deficits, moving all 4 extremities. Psychiatric: Normal mood and  affect.  Laboratory Data: No results found for: WBC, HGB, HCT, MCV, PLT  Lab Results  Component Value Date   CREATININE 1.10 06/09/2016    Assessment & Plan:    76 year old male with high-grade Ta urothelial carcinoma with CIS.  He received 3 BCG treatments however the last treatment was 5 months ago.  He has an indwelling Foley catheter.    I recommended discontinuing his Foley catheter and to check bladder scans x3 and send the results to the office for review.  An order was written to replace the Foley catheter for recurrent urinary retention.  We will tentatively schedule follow-up cystoscopy in early December and hopefully he will be able to be catheter free prior to cystoscopy.      Abbie Sons, [place] [address] [place], [place] [address] [phone]

## 2017-03-16 ENCOUNTER — Encounter: Payer: Self-pay | Admitting: Urology

## 2017-04-10 ENCOUNTER — Other Ambulatory Visit: Payer: Medicare Other | Admitting: Urology

## 2017-04-11 ENCOUNTER — Other Ambulatory Visit: Payer: Medicare Other | Admitting: Urology

## 2017-04-13 ENCOUNTER — Emergency Department
Admission: EM | Admit: 2017-04-13 | Discharge: 2017-04-13 | Disposition: A | Discharge status: Home / Self Care | Payer: Medicare Other | Attending: Student in an Organized Health Care Education/Training Program | Admitting: Student in an Organized Health Care Education/Training Program

## 2017-04-13 ENCOUNTER — Emergency Department: Payer: Medicare Other

## 2017-04-13 ENCOUNTER — Other Ambulatory Visit: Payer: Self-pay

## 2017-04-13 DIAGNOSIS — Z7901 Long term (current) use of anticoagulants: Secondary | ICD-10-CM | POA: Insufficient documentation

## 2017-04-13 DIAGNOSIS — R2240 Localized swelling, mass and lump, unspecified lower limb: Secondary | ICD-10-CM | POA: Diagnosis present

## 2017-04-13 DIAGNOSIS — R791 Abnormal coagulation profile: Secondary | ICD-10-CM | POA: Diagnosis not present

## 2017-04-13 DIAGNOSIS — Z8673 Personal history of transient ischemic attack (TIA), and cerebral infarction without residual deficits: Secondary | ICD-10-CM | POA: Insufficient documentation

## 2017-04-13 DIAGNOSIS — R410 Disorientation, unspecified: Secondary | ICD-10-CM | POA: Insufficient documentation

## 2017-04-13 DIAGNOSIS — R635 Abnormal weight gain: Secondary | ICD-10-CM | POA: Diagnosis not present

## 2017-04-13 DIAGNOSIS — M7989 Other specified soft tissue disorders: Secondary | ICD-10-CM

## 2017-04-13 DIAGNOSIS — Z79899 Other long term (current) drug therapy: Secondary | ICD-10-CM | POA: Insufficient documentation

## 2017-04-13 LAB — BASIC METABOLIC PANEL
Anion gap: 11 (ref 5–15)
BUN: 28 mg/dL — AB (ref 6–20)
CHLORIDE: 97 mmol/L — AB (ref 101–111)
CO2: 26 mmol/L (ref 22–32)
Calcium: 8.9 mg/dL (ref 8.9–10.3)
Creatinine, Ser: 1.32 mg/dL — ABNORMAL HIGH (ref 0.61–1.24)
GFR calc Af Amer: 59 mL/min — ABNORMAL LOW (ref >60)
GFR calc non Af Amer: 51 mL/min — ABNORMAL LOW (ref >60)
GLUCOSE: 114 mg/dL — AB (ref 65–99)
POTASSIUM: 4 mmol/L (ref 3.5–5.1)
SODIUM: 134 mmol/L — AB (ref 135–145)

## 2017-04-13 LAB — CBC
HEMATOCRIT: 36.2 % — AB (ref 40.0–52.0)
Hemoglobin: 11.8 g/dL — ABNORMAL LOW (ref 13.0–18.0)
MCH: 31 pg (ref 26.0–34.0)
MCHC: 32.6 g/dL (ref 32.0–36.0)
MCV: 94.9 fL (ref 80.0–100.0)
Platelets: 188 10*3/uL (ref 150–440)
RBC: 3.81 MIL/uL — ABNORMAL LOW (ref 4.40–5.90)
RDW: 22.2 % — AB (ref 11.5–14.5)
WBC: 6.1 10*3/uL (ref 3.8–10.6)

## 2017-04-13 LAB — PROTIME-INR
INR: 5.72
Prothrombin Time: 51.2 seconds — ABNORMAL HIGH (ref 11.4–15.2)

## 2017-04-13 LAB — APTT: APTT: 58 s — AB (ref 24–36)

## 2017-04-13 MED ORDER — FUROSEMIDE 40 MG PO TABS
40.0000 mg | ORAL_TABLET | Freq: Once | ORAL | Status: AC
  Administered 2017-04-13: 40 mg via ORAL
  Filled 2017-04-13: qty 1

## 2017-04-13 NOTE — ED Triage Notes (Signed)
First Nurse Note:  Arrives to have PT / INR checked.  Home health checked patient's level this morning and it was 9.  Referred to ED by Dr. Kyra Searles.

## 2017-04-13 NOTE — ED Triage Notes (Addendum)
Pt with wife. Wife states home health nurse checked pt PT/INR and it was 9. Pt takes warfarin. Pt has spots on L hand that he is worried about. Denies blood in stool denies any bloody vomit. No distress noted. Wife states pt has been confused slightly since heart surgery earlier this year. States he was in rehab and came home a few weeks ago.

## 2017-04-13 NOTE — ED Notes (Signed)
Patient transported to CT 

## 2017-04-13 NOTE — ED Provider Notes (Signed)
[facility] Emergency Department Provider Note    First MD Initiated Contact with Patient 04/13/17 1336     (approximate)  I have reviewed the triage vital signs and the nursing notes.   HISTORY  Chief Complaint Abnormal Lab    HPI Kyle Gregory is a 76 y.o. 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  Denies any chest pains or shortness of breath.  No new numbness or tingling.  No bleeding of his gums when he brushes his teeth.  No blood in his stools.  Has noted some bruises on his hands that have been there for several weeks.  No new bruising.  Denies any other pain or discomfort at this time.  Past Medical History:  Diagnosis Date  . Anemia   . Aortic stenosis   . Atrial fibrillation ([place])   . Cardiomyopathy ([place])   . Heartburn    Family History  Problem Relation Age of Onset  . Heart failure Father   . Heart failure Brother   . Atrial fibrillation Sister   . Prostate cancer Neg Hx   . Kidney cancer Neg Hx   . Bladder Cancer Neg Hx    Past Surgical History:  Procedure Laterality Date  . CYSTOSCOPY W/ RETROGRADES Bilateral 08/14/2016   Procedure: CYSTOSCOPY WITH RETROGRADE PYELOGRAM;  Surgeon: Hollice Espy, MD;  Location: [facility] ORS;  Service: Urology;  Laterality: Bilateral;  . LEFT HEART CATH AND CORONARY ANGIOGRAPHY Left 09/05/2016   Procedure: Left Heart Cath and Coronary Angiography;  Surgeon: Teodoro Spray, MD;  Location: [place] CV LAB;  Service: Cardiovascular;  Laterality: Left;  . neck growth removal     1970's  . TONSILLECTOMY    . TRANSURETHRAL RESECTION OF BLADDER TUMOR N/A 08/14/2016   Procedure: TRANSURETHRAL RESECTION OF BLADDER TUMOR (TURBT);  Surgeon: Hollice Espy, MD;  Location:  [facility] ORS;  Service: Urology;  Laterality: N/A;   Patient Active Problem List   Diagnosis Date Noted  . Morbid obesity with body mass index of 40.0-49.9 ([place]) 07/12/2016  . Aortic stenosis, mild 07/11/2016  . 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 Aortic atherosclerosis ([place]) 06/12/2016  . B12 deficiency 06/12/2016  . Iron deficiency anemia due to chronic blood loss 06/12/2016  . Atrial fibrillation, chronic ([place]) 06/09/2016      Prior to Admission medications   Medication Sig Start Date End Date Taking? Authorizing Provider  amiodarone (PACERONE) 200 MG tablet Take by mouth.    [provider]  ASPIRIN 81 PO Take by mouth.    [provider]  atorvastatin (LIPITOR) 80 MG tablet Take by mouth.    [provider]  Calcium Carbonate-Vitamin D (OYSTER SHELL CALCIUM 500 + D) 500-125 MG-UNIT TABS Take by mouth.    [provider]  Cyanocobalamin (VITAMIN B-12) 5000 MCG SUBL Place 5,000 mcg under the tongue daily after breakfast.    [provider]  FLUoxetine (PROZAC) 20 MG capsule Take by mouth.    [provider]  furosemide (LASIX) 20 MG tablet Take 20 mg by mouth daily after breakfast.     [provider]  Iron-Vitamin C (VITRON-C) 65-125 MG TABS Take 1 tablet by mouth daily after breakfast.     [provider]  lisinopril (PRINIVIL,ZESTRIL)  10 MG tablet Take 10 mg by mouth daily after breakfast.  06/15/16 06/15/17  [provider]  metoprolol tartrate (LOPRESSOR) 25 MG tablet Take by mouth.    [provider]  nitroGLYCERIN (NITROSTAT) 0.4 MG SL tablet Place under the tongue. 02/22/17 02/22/18  [provider]  Nutritional Supplements (PROMOD) LIQD Take by mouth.    [provider]  omeprazole (PRILOSEC) 20 MG capsule Take 20 mg by mouth daily before breakfast.     [provider]  polyethylene glycol (MIRALAX / GLYCOLAX) packet Take by mouth. 12/08/16   [provider]  senna-docusate (SENOKOT-S) 8.6-50 MG tablet Take by mouth. 10/11/16 10/11/17  [provider]  warfarin (COUMADIN) 5 MG tablet Take by mouth. 02/22/17 02/22/18  [provider]    Allergies Patient has no known allergies.    Social History Social History   Tobacco Use  . Smoking status: Never Smoker  . Smokeless tobacco: Never Used  Substance Use Topics  . Alcohol use: No  . Drug use: No    Review of Systems Patient denies headaches, rhinorrhea, blurry vision, numbness, shortness of breath, chest pain, edema, cough, abdominal pain, nausea, vomiting, diarrhea, dysuria, fevers, rashes or hallucinations unless otherwise stated above in HPI. ____________________________________________   PHYSICAL EXAM:  VITAL SIGNS: Vitals:   04/13/17 1129  BP: 117/72  Pulse: 72  Resp: 18  Temp: 97.6 F (36.4 C)  SpO2: 100%    Constitutional: Alert and oriented.  in no acute distress. Eyes: Conjunctivae are normal.  Head: Atraumatic. Nose: No congestion/rhinnorhea. Mouth/Throat: Mucous membranes are moist.  No oropharyngeal petechia or bleeding Neck: No stridor. Painless ROM.  Cardiovascular: Normal rate, regular rhythm. Grossly normal heart sounds.  Good peripheral circulation. Respiratory: Normal respiratory effort.  No retractions. Lungs CTAB. Gastrointestinal: Soft and nontender. No distention. No abdominal bruits. No CVA tenderness. Genitourinary:  Musculoskeletal: scattered ecchymosis to BUE hands predominantly. No lower extremity tenderness, 1+ edema.  No joint effusions. Neurologic:  Normal speech and language. No gross focal neurologic deficits are appreciated. No facial droop Skin:  Skin is warm, dry and intact. No rash noted. Psychiatric: Mood and affect are normal. Speech and behavior are normal.  ____________________________________________   LABS (all labs ordered are listed, but only abnormal results are displayed)  Results for  orders placed or performed during the hospital encounter of 04/13/17 (from the past 24 hour(s))  Basic metabolic panel     Status: Abnormal   Collection Time: 04/13/17 11:43 AM  Result Value Ref Range   Sodium 134 (L) 135 - 145 mmol/L   Potassium 4.0 3.5 - 5.1 mmol/L   Chloride 97 (L) 101 - 111 mmol/L   CO2 26 22 - 32 mmol/L   Glucose, Bld 114 (H) 65 - 99 mg/dL   BUN 28 (H) 6 - 20 mg/dL   Creatinine, Ser 1.32 (H) 0.61 - 1.24 mg/dL   Calcium 8.9 8.9 - 10.3 mg/dL   GFR calc non Af Amer 51 (L) >60 mL/min   GFR calc Af Amer 59 (L) >60 mL/min   Anion gap 11 5 - 15  CBC     Status: Abnormal   Collection Time: 04/13/17 11:43 AM  Result Value Ref Range   WBC 6.1 3.8 - 10.6 K/uL   RBC 3.81 (L) 4.40 - 5.90 MIL/uL   Hemoglobin 11.8 (L) 13.0 - 18.0 g/dL   HCT 36.2 (L) 40.0 - 52.0 %   MCV 94.9 80.0 - 100.0 fL   MCH 31.0  26.0 - 34.0 pg   MCHC 32.6 32.0 - 36.0 g/dL   RDW 22.2 (H) 11.5 - 14.5 %   Platelets 188 150 - 440 K/uL  Protime-INR     Status: Abnormal   Collection Time: 04/13/17 11:43 AM  Result Value Ref Range   Prothrombin Time 51.2 (H) 11.4 - 15.2 seconds   INR 5.72 (HH)   APTT     Status: Abnormal   Collection Time: 04/13/17 11:43 AM  Result Value Ref Range   aPTT 58 (H) 24 - 36 seconds   ____________________________________________  _________________________________  RADIOLOGY  I personally reviewed all radiographic images ordered to evaluate for the above acute complaints and reviewed radiology reports and findings.  These findings were personally discussed with the patient.  Please see medical record for radiology report.  ____________________________________________   PROCEDURES  Procedure(s) performed:  Procedures    Critical Care performed: no ____________________________________________   INITIAL IMPRESSION / ASSESSMENT AND PLAN / ED COURSE  Pertinent labs & imaging results that were available during my care of the patient were reviewed by me and  considered in my medical decision making (see chart for details).  DDX: supratherapeutic INR, med noncompliance, anemia, thrombocytopenia, bleeding  SAIF PETER is a 76 y.o. who presents to the ED with symptoms as described above.  Patient with supratherapeutic INR but no evidence of spontaneous hemorrhage.  CT imaging ordered as the wife states that she had noticed that he was more confused than usual.  CT shows no acute abnormality.  No evidence of spontaneous hemorrhage.  No evidence of GI bleed.  No acute anemia.  Blood work otherwise at baseline.  Will have patient hold Coumadin and return to ER for repeat INR test on Sunday.  Patient also with lower extremity edema.  Will give double dose of Lasix.  No evidence of acute heart failure, hypoxia or hypertensive urgency.  Patient stable and appropriate for outpatient workup.      ____________________________________________   FINAL CLINICAL IMPRESSION(S) / ED DIAGNOSES  Final diagnoses:  Supratherapeutic INR  Leg swelling      NEW MEDICATIONS STARTED DURING THIS VISIT:  This SmartLink is deprecated. Use AVSMEDLIST instead to display the medication list for a patient.   Note:  This document was prepared using Dragon voice recognition software and may include unintentional dictation errors.     Merlyn Lot, MD 04/13/17 [phone]

## 2017-04-13 NOTE — Discharge Instructions (Signed)
Hold your Coumadin until your INR level is rechecked on Sunday.  Please return to the ER to have this blood test rechecked.  You may take all of your other medications as prescribed.  Return for any bleeding.  Worsening shortness of breath chest pain or for any additional questions or concerns.

## 2017-04-13 NOTE — ED Notes (Signed)
Assisted patient to BR.  AAOx3.  Skin warm and dry. NAD

## 2017-04-15 ENCOUNTER — Encounter: Payer: Self-pay | Admitting: Emergency Medicine

## 2017-04-15 ENCOUNTER — Other Ambulatory Visit: Payer: Self-pay

## 2017-04-15 ENCOUNTER — Emergency Department
Admission: EM | Admit: 2017-04-15 | Discharge: 2017-04-15 | Disposition: A | Discharge status: Home / Self Care | Payer: Medicare Other | Source: Home / Self Care | Attending: Emergency Medicine | Admitting: Emergency Medicine

## 2017-04-15 DIAGNOSIS — I509 Heart failure, unspecified: Secondary | ICD-10-CM

## 2017-04-15 DIAGNOSIS — Z79899 Other long term (current) drug therapy: Secondary | ICD-10-CM

## 2017-04-15 DIAGNOSIS — I110 Hypertensive heart disease with heart failure: Secondary | ICD-10-CM | POA: Diagnosis not present

## 2017-04-15 DIAGNOSIS — I5023 Acute on chronic systolic (congestive) heart failure: Secondary | ICD-10-CM

## 2017-04-15 DIAGNOSIS — Z7901 Long term (current) use of anticoagulants: Secondary | ICD-10-CM | POA: Insufficient documentation

## 2017-04-15 DIAGNOSIS — R791 Abnormal coagulation profile: Secondary | ICD-10-CM

## 2017-04-15 DIAGNOSIS — Z7982 Long term (current) use of aspirin: Secondary | ICD-10-CM

## 2017-04-15 LAB — COMPREHENSIVE METABOLIC PANEL
ALK PHOS: 103 U/L (ref 38–126)
ALT: 57 U/L (ref 17–63)
AST: 54 U/L — AB (ref 15–41)
Albumin: 3.8 g/dL (ref 3.5–5.0)
Anion gap: 12 (ref 5–15)
BILIRUBIN TOTAL: 4.2 mg/dL — AB (ref 0.3–1.2)
BUN: 31 mg/dL — AB (ref 6–20)
CALCIUM: 8.8 mg/dL — AB (ref 8.9–10.3)
CO2: 25 mmol/L (ref 22–32)
Chloride: 94 mmol/L — ABNORMAL LOW (ref 101–111)
Creatinine, Ser: 1.33 mg/dL — ABNORMAL HIGH (ref 0.61–1.24)
GFR calc Af Amer: 58 mL/min — ABNORMAL LOW (ref >60)
GFR, EST NON AFRICAN AMERICAN: 50 mL/min — AB (ref >60)
Glucose, Bld: 108 mg/dL — ABNORMAL HIGH (ref 65–99)
POTASSIUM: 3.9 mmol/L (ref 3.5–5.1)
Sodium: 131 mmol/L — ABNORMAL LOW (ref 135–145)
TOTAL PROTEIN: 7 g/dL (ref 6.5–8.1)

## 2017-04-15 LAB — CBC WITH DIFFERENTIAL/PLATELET
BASOS ABS: 0.1 10*3/uL (ref 0–0.1)
BASOS PCT: 1 %
EOS ABS: 0.1 10*3/uL (ref 0–0.7)
EOS PCT: 1 %
HCT: 34 % — ABNORMAL LOW (ref 40.0–52.0)
Hemoglobin: 11.4 g/dL — ABNORMAL LOW (ref 13.0–18.0)
LYMPHS PCT: 7 %
Lymphs Abs: 0.5 10*3/uL — ABNORMAL LOW (ref 1.0–3.6)
MCH: 31.9 pg (ref 26.0–34.0)
MCHC: 33.4 g/dL (ref 32.0–36.0)
MCV: 95.4 fL (ref 80.0–100.0)
Monocytes Absolute: 0.6 10*3/uL (ref 0.2–1.0)
Monocytes Relative: 8 %
Neutro Abs: 6.6 10*3/uL — ABNORMAL HIGH (ref 1.4–6.5)
Neutrophils Relative %: 83 %
PLATELETS: 193 10*3/uL (ref 150–440)
RBC: 3.57 MIL/uL — AB (ref 4.40–5.90)
RDW: 22.8 % — AB (ref 11.5–14.5)
WBC: 7.8 10*3/uL (ref 3.8–10.6)

## 2017-04-15 LAB — PROTIME-INR
INR: 5.55
PROTHROMBIN TIME: 50 s — AB (ref 11.4–15.2)

## 2017-04-15 MED ORDER — FUROSEMIDE 40 MG PO TABS
40.0000 mg | ORAL_TABLET | Freq: Once | ORAL | Status: AC
  Administered 2017-04-15: 40 mg via ORAL
  Filled 2017-04-15: qty 1

## 2017-04-15 NOTE — Discharge Instructions (Signed)
Today your INR came down a little bit but not enough.  Please hold your Coumadin and get it rechecked on December 19 this coming Wednesday.  Return to the emergency department sooner for any concerns.  It was a pleasure to take care of you today, and thank you for coming to our emergency department.  If you have any questions or concerns before leaving please ask the nurse to grab me and I'm more than happy to go through your aftercare instructions again.  If you were prescribed any opioid pain medication today such as Norco, Vicodin, Percocet, morphine, hydrocodone, or oxycodone please make sure you do not drive when you are taking this medication as it can alter your ability to drive safely.  If you have any concerns once you are home that you are not improving or are in fact getting worse before you can make it to your follow-up appointment, please do not hesitate to call [phone] and come back for further evaluation.  Darel Hong, MD  Results for orders placed or performed during the hospital encounter of 04/15/17  Protime-INR  Result Value Ref Range   Prothrombin Time 50.0 (H) 11.4 - 15.2 seconds   INR 5.55 (HH)   Comprehensive metabolic panel  Result Value Ref Range   Sodium 131 (L) 135 - 145 mmol/L   Potassium 3.9 3.5 - 5.1 mmol/L   Chloride 94 (L) 101 - 111 mmol/L   CO2 25 22 - 32 mmol/L   Glucose, Bld 108 (H) 65 - 99 mg/dL   BUN 31 (H) 6 - 20 mg/dL   Creatinine, Ser 1.33 (H) 0.61 - 1.24 mg/dL   Calcium 8.8 (L) 8.9 - 10.3 mg/dL   Total Protein 7.0 6.5 - 8.1 g/dL   Albumin 3.8 3.5 - 5.0 g/dL   AST 54 (H) 15 - 41 U/L   ALT 57 17 - 63 U/L   Alkaline Phosphatase 103 38 - 126 U/L   Total Bilirubin 4.2 (H) 0.3 - 1.2 mg/dL   GFR calc non Af Amer 50 (L) >60 mL/min   GFR calc Af Amer 58 (L) >60 mL/min   Anion gap 12 5 - 15  CBC with Differential  Result Value Ref Range   WBC 7.8 3.8 - 10.6 K/uL   RBC 3.57 (L) 4.40 - 5.90 MIL/uL   Hemoglobin 11.4 (L) 13.0 - 18.0 g/dL   HCT 34.0 (L)  40.0 - 52.0 %   MCV 95.4 80.0 - 100.0 fL   MCH 31.9 26.0 - 34.0 pg   MCHC 33.4 32.0 - 36.0 g/dL   RDW 22.8 (H) 11.5 - 14.5 %   Platelets 193 150 - 440 K/uL   Neutrophils Relative % 83 %   Neutro Abs 6.6 (H) 1.4 - 6.5 K/uL   Lymphocytes Relative 7 %   Lymphs Abs 0.5 (L) 1.0 - 3.6 K/uL   Monocytes Relative 8 %   Monocytes Absolute 0.6 0.2 - 1.0 K/uL   Eosinophils Relative 1 %   Eosinophils Absolute 0.1 0 - 0.7 K/uL   Basophils Relative 1 %   Basophils Absolute 0.1 0 - 0.1 K/uL   Ct Head Wo Contrast  Result Date: 04/13/2017 CLINICAL DATA:  76 year old male with coagulopathy on warfarin. Reason confusion. EXAM: CT HEAD WITHOUT CONTRAST TECHNIQUE: Contiguous axial images were obtained from the base of the skull through the vertex without intravenous contrast. COMPARISON:  None. FINDINGS: Brain: Patchy and confluent bilateral cerebral white matter hypodensity. Generalized cerebral volume loss. No midline shift,  ventriculomegaly, mass effect, evidence of mass lesion, intracranial hemorrhage or evidence of cortically based acute infarction. Gray-white matter differentiation is within normal limits throughout the brain. No cortical encephalomalacia identified. Vascular: Calcified atherosclerosis at the skull base. No suspicious intracranial vascular hyperdensity. Skull: Negative.  No acute osseous abnormality identified. Sinuses/Orbits: Clear. Other: Visualized orbits and scalp soft tissues are within normal limits. IMPRESSION: 1.  No acute intracranial abnormality. 2. Moderately advanced cerebral white matter hypodensity, most commonly due to chronic small vessel disease. Electronically Signed   By: Genevie Ann M.D.   On: 04/13/2017 14:04

## 2017-04-15 NOTE — ED Provider Notes (Signed)
[facility] Emergency Department Provider Note  ____________________________________________   First MD Initiated Contact with Patient 04/15/17 [phone]     (approximate)  I have reviewed the triage vital signs and the nursing notes.   HISTORY  Chief Complaint Abnormal Lab    HPI Kyle Gregory is a 76 y.o. male who comes to the emergency department for recheck of his INR.  He was seen in the emergency department 2 days ago after being told by his home health nurse that his INR was 8 and in the ER his INR was found to be 5.75.  He was advised to hold his Coumadin for 2 days and come back for a recheck.  He comes to the ER this early in the morning because he was anxious to get his blood rechecked and have it done.  The patient's wife also reports that the patient has a history of CHF and has had some increasing bilateral lower extremity swelling recently.  No chest pain or shortness of breath.  He sleeps on one pillow.  His symptoms have had insidious onset and have been constant.  Nothing seems to make it better or worse.   09/05/16 Cath:  Prox Cx to Mid Cx lesion, 85 %stenosed.  2nd Mrg lesion, 60 %stenosed.  Prox LAD lesion, 100 %stenosed.  Ost RCA lesion, 90 %stenosed.  Prox RCA lesion, 85 %stenosed.  The left ventricular ejection fraction is 25-35% by visual estimate.  There is moderate to severe left ventricular systolic dysfunction.  There is trivial (1+) mitral regurgitation.  There is no aortic valve stenosis.  There is trivial (1+) mitral regurgitation.   LM-insignificant  LAD-occluded after take off of D1. Faint left to left collaterals LCx-80-90% mid stenosis RCA 90% ostial, with 85-90% proximal stenosis LV-ef 25-35%. Anteroapical akinesis with global hypokinesis.   Past Medical History:  Diagnosis Date  . Anemia   . Aortic stenosis   . Atrial fibrillation ([place])   . Cardiomyopathy ([place])   . Heartburn     Patient Active Problem  List   Diagnosis Date Noted  . Morbid obesity with body mass index of 40.0-49.9 ([place]) 07/12/2016  . Aortic stenosis, mild 07/11/2016  . 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 Aortic atherosclerosis ([place]) 06/12/2016  . B12 deficiency 06/12/2016  . Iron deficiency anemia due to chronic blood loss 06/12/2016  . Atrial fibrillation, chronic ([place]) 06/09/2016    Past Surgical History:  Procedure Laterality Date  . CYSTOSCOPY W/ RETROGRADES Bilateral 08/14/2016   Procedure: CYSTOSCOPY WITH RETROGRADE PYELOGRAM;  Surgeon: Hollice Espy, MD;  Location: [facility] ORS;  Service: Urology;  Laterality: Bilateral;  . LEFT HEART CATH AND CORONARY ANGIOGRAPHY Left 09/05/2016   Procedure: Left Heart Cath and Coronary Angiography;  Surgeon: Teodoro Spray, MD;  Location: [place] CV LAB;  Service: Cardiovascular;  Laterality: Left;  . neck growth removal     1970's  . TONSILLECTOMY    . TRANSURETHRAL RESECTION OF BLADDER TUMOR N/A 08/14/2016   Procedure: TRANSURETHRAL RESECTION OF BLADDER TUMOR (TURBT);  Surgeon: Hollice Espy, MD;  Location: [facility] ORS;  Service: Urology;  Laterality: N/A;    Prior to Admission medications   Medication Sig Start Date End Date Taking? Authorizing Provider  amiodarone (PACERONE) 200 MG tablet Take by mouth.    [provider]  ASPIRIN 81 PO Take by mouth.    [provider]  atorvastatin (LIPITOR) 80 MG tablet Take by mouth.    [provider]  Calcium Carbonate-Vitamin  D (OYSTER SHELL CALCIUM 500 + D) 500-125 MG-UNIT TABS Take by mouth.    [provider]  Cyanocobalamin (VITAMIN B-12) 5000 MCG SUBL Place 5,000 mcg under the tongue daily after breakfast.    [provider]  FLUoxetine (PROZAC) 20 MG capsule Take by mouth.    [provider]  furosemide (LASIX) 20 MG tablet Take 20 mg by mouth daily after breakfast.     [provider]  Iron-Vitamin C (VITRON-C) 65-125 MG TABS Take 1 tablet by mouth daily  after breakfast.     [provider]  lisinopril (PRINIVIL,ZESTRIL) 10 MG tablet Take 10 mg by mouth daily after breakfast.  06/15/16 06/15/17  [provider]  metoprolol tartrate (LOPRESSOR) 25 MG tablet Take by mouth.    [provider]  nitroGLYCERIN (NITROSTAT) 0.4 MG SL tablet Place under the tongue. 02/22/17 02/22/18  [provider]  Nutritional Supplements (PROMOD) LIQD Take by mouth.    [provider]  omeprazole (PRILOSEC) 20 MG capsule Take 20 mg by mouth daily before breakfast.     [provider]  polyethylene glycol (MIRALAX / GLYCOLAX) packet Take by mouth. 12/08/16   [provider]  senna-docusate (SENOKOT-S) 8.6-50 MG tablet Take by mouth. 10/11/16 10/11/17  [provider]  warfarin (COUMADIN) 5 MG tablet Take by mouth. 02/22/17 02/22/18  [provider]    Allergies Patient has no known allergies.  Family History  Problem Relation Age of Onset  . Heart failure Father   . Heart failure Brother   . Atrial fibrillation Sister   . Prostate cancer Neg Hx   . Kidney cancer Neg Hx   . Bladder Cancer Neg Hx     Social History Social History   Tobacco Use  . Smoking status: Never Smoker  . Smokeless tobacco: Never Used  Substance Use Topics  . Alcohol use: No  . Drug use: No    Review of Systems Constitutional: No fever/chills ENT: No sore throat. Cardiovascular: Denies chest pain. Respiratory: Denies shortness of breath. Gastrointestinal: No abdominal pain.  No nausea, no vomiting.  No diarrhea.  No constipation. Musculoskeletal: Negative for back pain. Neurological: Negative for headaches   ____________________________________________   PHYSICAL EXAM:  VITAL SIGNS: ED Triage Vitals  Enc Vitals Group     BP 04/15/17 0454 103/63     Pulse Rate 04/15/17 0454 78     Resp 04/15/17 0454 16     Temp 04/15/17 0454 (!) 96.1 F (35.6 C)     Temp Source 04/15/17 0454 Axillary      SpO2 04/15/17 0454 97 %     Weight 04/15/17 0454 216 lb (98 kg)     Height 04/15/17 0454 6' (1.829 m)     Head Circumference --      Peak Flow --      Pain Score 04/15/17 0451 0     Pain Loc --      Pain Edu? --      Excl. in [place]? --     Constitutional: Pleasant cooperative speaks in full clear sentences no diaphoresis Head: Atraumatic. Nose: No congestion/rhinnorhea. Mouth/Throat: No trismus Neck: No stridor.   Cardiovascular: Regular rate and rhythm able to lie completely flat Respiratory: Normal respiratory effort.  No retractions.  Clear to auscultation bilaterally gastrointestinal: Soft nontender Neurologic:  Normal speech and language. No gross focal neurologic deficits are appreciated.  Skin:  Skin is warm, dry and intact. No rash noted.    ____________________________________________  LABS (all labs ordered are listed, but only abnormal results are displayed)  Labs Reviewed  PROTIME-INR - Abnormal; Notable for the following components:      Result Value   Prothrombin Time 50.0 (*)    INR 5.55 (*)    All other components within normal limits  COMPREHENSIVE METABOLIC PANEL - Abnormal; Notable for the following components:   Sodium 131 (*)    Chloride 94 (*)    Glucose, Bld 108 (*)    BUN 31 (*)    Creatinine, Ser 1.33 (*)    Calcium 8.8 (*)    AST 54 (*)    Total Bilirubin 4.2 (*)    GFR calc non Af Amer 50 (*)    GFR calc Af Amer 58 (*)    All other components within normal limits  CBC WITH DIFFERENTIAL/PLATELET - Abnormal; Notable for the following components:   RBC 3.57 (*)    Hemoglobin 11.4 (*)    HCT 34.0 (*)    RDW 22.8 (*)    Neutro Abs 6.6 (*)    Lymphs Abs 0.5 (*)    All other components within normal limits    Blood work reviewed by me with elevated INR although somewhat decreased from previous.  No drop in his  hemoglobin __________________________________________  EKG   ____________________________________________  RADIOLOGY   ____________________________________________   DIFFERENTIAL includes but not limited to  Supratherapeutic INR, anemia, active bleeding   PROCEDURES  Procedure(s) performed: no  Procedures  Critical Care performed: no  Observation: no ____________________________________________   INITIAL IMPRESSION / ASSESSMENT AND PLAN / ED COURSE  Pertinent labs & imaging results that were available during my care of the patient were reviewed by me and considered in my medical decision making (see chart for details).  Patient arrives hemodynamically stable well-appearing.  He does have some increasing bilateral lower extremity edema for which we will give an increased dose of Lasix now.  Blood work is pending.     ----------------------------------------- 6:09 AM on 04/15/2017 -----------------------------------------  Patient's INR is down from previous although not significantly so.  He was given a 2-day check, however given how late he was discharged previously this is really only about a day day and a half.  I have advised him to hold his Coumadin for another 3 days and follow-up.  I will also give him referral to the heart failure clinic.  He is discharged home in improved condition. ____________________________________________   FINAL CLINICAL IMPRESSION(S) / ED DIAGNOSES  Final diagnoses:  Acute on chronic congestive heart failure, unspecified heart failure type (HCC)  Abnormal INR      NEW MEDICATIONS STARTED DURING THIS VISIT:  This SmartLink is deprecated. Use AVSMEDLIST instead to display the medication list for a patient.   Note:  This document was prepared using Dragon voice recognition software and may include unintentional dictation errors.      Darel Hong, MD 04/15/17 [phone]

## 2017-04-15 NOTE — ED Triage Notes (Signed)
Pt to triage via [facility], reports was seen in ED when home health was getting abnormal numbers for POC testing, on Friday, INR was >5, told to return this AM for recheck.  Pt on warfarin but has been holding doses since seen on Friday.  Wife also reports some fluid retention.

## 2017-04-15 NOTE — ED Notes (Signed)
CRITICAL LAB: INR is 5.55, [facility], Dr. Mable Paris notified, orders received

## 2017-04-17 ENCOUNTER — Emergency Department: Payer: Medicare Other

## 2017-04-17 ENCOUNTER — Other Ambulatory Visit: Payer: Self-pay

## 2017-04-17 ENCOUNTER — Encounter: Payer: Self-pay | Admitting: Emergency Medicine

## 2017-04-17 ENCOUNTER — Inpatient Hospital Stay
Admission: EM | Admit: 2017-04-17 | Discharge: 2017-04-19 | DRG: 293 | Disposition: A | Discharge status: Home / Self Care | Payer: Medicare Other | Attending: Internal Medicine | Admitting: Internal Medicine

## 2017-04-17 ENCOUNTER — Telehealth: Payer: Self-pay

## 2017-04-17 DIAGNOSIS — I110 Hypertensive heart disease with heart failure: Principal | ICD-10-CM | POA: Diagnosis present

## 2017-04-17 DIAGNOSIS — Z952 Presence of prosthetic heart valve: Secondary | ICD-10-CM

## 2017-04-17 DIAGNOSIS — R600 Localized edema: Secondary | ICD-10-CM | POA: Diagnosis present

## 2017-04-17 DIAGNOSIS — F329 Major depressive disorder, single episode, unspecified: Secondary | ICD-10-CM | POA: Diagnosis present

## 2017-04-17 DIAGNOSIS — Z951 Presence of aortocoronary bypass graft: Secondary | ICD-10-CM

## 2017-04-17 DIAGNOSIS — Z9889 Other specified postprocedural states: Secondary | ICD-10-CM

## 2017-04-17 DIAGNOSIS — Z7901 Long term (current) use of anticoagulants: Secondary | ICD-10-CM

## 2017-04-17 DIAGNOSIS — I350 Nonrheumatic aortic (valve) stenosis: Secondary | ICD-10-CM | POA: Diagnosis present

## 2017-04-17 DIAGNOSIS — Z7982 Long term (current) use of aspirin: Secondary | ICD-10-CM | POA: Diagnosis not present

## 2017-04-17 DIAGNOSIS — R0601 Orthopnea: Secondary | ICD-10-CM | POA: Diagnosis present

## 2017-04-17 DIAGNOSIS — I5023 Acute on chronic systolic (congestive) heart failure: Secondary | ICD-10-CM | POA: Diagnosis present

## 2017-04-17 DIAGNOSIS — Z79899 Other long term (current) drug therapy: Secondary | ICD-10-CM | POA: Diagnosis not present

## 2017-04-17 DIAGNOSIS — Z8249 Family history of ischemic heart disease and other diseases of the circulatory system: Secondary | ICD-10-CM | POA: Diagnosis not present

## 2017-04-17 DIAGNOSIS — I2510 Atherosclerotic heart disease of native coronary artery without angina pectoris: Secondary | ICD-10-CM | POA: Diagnosis present

## 2017-04-17 DIAGNOSIS — Z8673 Personal history of transient ischemic attack (TIA), and cerebral infarction without residual deficits: Secondary | ICD-10-CM | POA: Diagnosis not present

## 2017-04-17 DIAGNOSIS — I482 Chronic atrial fibrillation: Secondary | ICD-10-CM | POA: Diagnosis present

## 2017-04-17 DIAGNOSIS — Z8551 Personal history of malignant neoplasm of bladder: Secondary | ICD-10-CM

## 2017-04-17 DIAGNOSIS — J209 Acute bronchitis, unspecified: Secondary | ICD-10-CM | POA: Diagnosis present

## 2017-04-17 LAB — CBC
HCT: 37.3 % — ABNORMAL LOW (ref 40.0–52.0)
HEMOGLOBIN: 12.2 g/dL — AB (ref 13.0–18.0)
MCH: 31.7 pg (ref 26.0–34.0)
MCHC: 32.7 g/dL (ref 32.0–36.0)
MCV: 97 fL (ref 80.0–100.0)
Platelets: 196 10*3/uL (ref 150–440)
RBC: 3.84 MIL/uL — AB (ref 4.40–5.90)
RDW: 23.3 % — ABNORMAL HIGH (ref 11.5–14.5)
WBC: 7.9 10*3/uL (ref 3.8–10.6)

## 2017-04-17 LAB — COMPREHENSIVE METABOLIC PANEL
ALK PHOS: 117 U/L (ref 38–126)
ALT: 100 U/L — AB (ref 17–63)
AST: 81 U/L — ABNORMAL HIGH (ref 15–41)
Albumin: 3.7 g/dL (ref 3.5–5.0)
Anion gap: 14 (ref 5–15)
BILIRUBIN TOTAL: 4.7 mg/dL — AB (ref 0.3–1.2)
BUN: 27 mg/dL — ABNORMAL HIGH (ref 6–20)
CALCIUM: 9 mg/dL (ref 8.9–10.3)
CO2: 24 mmol/L (ref 22–32)
CREATININE: 1.24 mg/dL (ref 0.61–1.24)
Chloride: 94 mmol/L — ABNORMAL LOW (ref 101–111)
GFR, EST NON AFRICAN AMERICAN: 55 mL/min — AB (ref >60)
Glucose, Bld: 102 mg/dL — ABNORMAL HIGH (ref 65–99)
Potassium: 4 mmol/L (ref 3.5–5.1)
Sodium: 132 mmol/L — ABNORMAL LOW (ref 135–145)
TOTAL PROTEIN: 7.1 g/dL (ref 6.5–8.1)

## 2017-04-17 LAB — BRAIN NATRIURETIC PEPTIDE: B Natriuretic Peptide: 2081 pg/mL — ABNORMAL HIGH (ref 0.0–100.0)

## 2017-04-17 LAB — INFLUENZA PANEL BY PCR (TYPE A & B)
Influenza A By PCR: NEGATIVE
Influenza B By PCR: NEGATIVE

## 2017-04-17 LAB — TROPONIN I
TROPONIN I: 0.06 ng/mL — AB (ref <0.03)
TROPONIN I: 0.06 ng/mL — AB (ref <0.03)
Troponin I: 0.06 ng/mL (ref <0.03)

## 2017-04-17 LAB — PROTIME-INR
INR: 3.63
Prothrombin Time: 35.9 seconds — ABNORMAL HIGH (ref 11.4–15.2)

## 2017-04-17 MED ORDER — ATORVASTATIN CALCIUM 20 MG PO TABS
80.0000 mg | ORAL_TABLET | Freq: Every day | ORAL | Status: DC
  Administered 2017-04-18: 80 mg via ORAL
  Filled 2017-04-17: qty 4

## 2017-04-17 MED ORDER — FUROSEMIDE 10 MG/ML IJ SOLN
40.0000 mg | Freq: Once | INTRAMUSCULAR | Status: AC
  Administered 2017-04-17: 40 mg via INTRAVENOUS
  Filled 2017-04-17: qty 4

## 2017-04-17 MED ORDER — FUROSEMIDE 10 MG/ML IJ SOLN
40.0000 mg | Freq: Two times a day (BID) | INTRAMUSCULAR | Status: DC
  Administered 2017-04-18 – 2017-04-19 (×3): 40 mg via INTRAVENOUS
  Filled 2017-04-17 (×3): qty 4

## 2017-04-17 MED ORDER — FERROUS SULFATE 325 (65 FE) MG PO TABS
325.0000 mg | ORAL_TABLET | Freq: Every day | ORAL | Status: DC
  Administered 2017-04-18 – 2017-04-19 (×2): 325 mg via ORAL
  Filled 2017-04-17 (×2): qty 1

## 2017-04-17 MED ORDER — ONDANSETRON HCL 4 MG PO TABS: 4.0000 mg | ORAL_TABLET | Freq: Four times a day (QID) | ORAL | Status: DC | PRN

## 2017-04-17 MED ORDER — ACETAMINOPHEN 650 MG RE SUPP: 650.0000 mg | Freq: Four times a day (QID) | RECTAL | Status: DC | PRN

## 2017-04-17 MED ORDER — ONDANSETRON HCL 4 MG/2ML IJ SOLN: 4.0000 mg | Freq: Four times a day (QID) | INTRAMUSCULAR | Status: DC | PRN

## 2017-04-17 MED ORDER — VITAMIN C 250 MG PO TABS
250.0000 mg | ORAL_TABLET | Freq: Every day | ORAL | Status: DC
  Administered 2017-04-18 – 2017-04-19 (×2): 250 mg via ORAL
  Filled 2017-04-17 (×2): qty 1

## 2017-04-17 MED ORDER — BISACODYL 5 MG PO TBEC: 5.0000 mg | DELAYED_RELEASE_TABLET | Freq: Every day | ORAL | Status: DC | PRN

## 2017-04-17 MED ORDER — METOPROLOL TARTRATE 25 MG PO TABS
12.5000 mg | ORAL_TABLET | Freq: Two times a day (BID) | ORAL | Status: DC
  Administered 2017-04-17 – 2017-04-19 (×4): 12.5 mg via ORAL
  Filled 2017-04-17 (×4): qty 1

## 2017-04-17 MED ORDER — AMIODARONE HCL 200 MG PO TABS
200.0000 mg | ORAL_TABLET | Freq: Every day | ORAL | Status: DC
Start: 2017-04-18 — End: 2017-04-19
  Administered 2017-04-18 – 2017-04-19 (×2): 200 mg via ORAL
  Filled 2017-04-17 (×2): qty 1

## 2017-04-17 MED ORDER — DOCUSATE SODIUM 100 MG PO CAPS
100.0000 mg | ORAL_CAPSULE | Freq: Two times a day (BID) | ORAL | Status: DC
  Administered 2017-04-17 – 2017-04-19 (×4): 100 mg via ORAL
  Filled 2017-04-17 (×4): qty 1

## 2017-04-17 MED ORDER — ACETAMINOPHEN 325 MG PO TABS: 650.0000 mg | ORAL_TABLET | Freq: Four times a day (QID) | ORAL | Status: DC | PRN

## 2017-04-17 MED ORDER — TRAZODONE HCL 50 MG PO TABS: 25.0000 mg | ORAL_TABLET | Freq: Every evening | ORAL | Status: DC | PRN

## 2017-04-17 MED ORDER — FLUOXETINE HCL 10 MG PO CAPS
10.0000 mg | ORAL_CAPSULE | Freq: Every day | ORAL | Status: DC
  Administered 2017-04-18 – 2017-04-19 (×2): 10 mg via ORAL
  Filled 2017-04-17 (×2): qty 1

## 2017-04-17 MED ORDER — IRON-VITAMIN C 65-125 MG PO TABS: 1.0000 | ORAL_TABLET | Freq: Every day | ORAL | Status: DC

## 2017-04-17 MED ORDER — HYDROCODONE-ACETAMINOPHEN 5-325 MG PO TABS: 1.0000 | ORAL_TABLET | ORAL | Status: DC | PRN

## 2017-04-17 MED ORDER — LISINOPRIL 10 MG PO TABS
10.0000 mg | ORAL_TABLET | Freq: Every day | ORAL | Status: DC
  Administered 2017-04-18 – 2017-04-19 (×2): 10 mg via ORAL
  Filled 2017-04-17 (×2): qty 1

## 2017-04-17 NOTE — ED Triage Notes (Signed)
Pt presents from home after home health nurse detected low O2 saturation Wife states she has had URI symptoms, and thinks he has caught it. Pt's PCP started him on cefuroxime axetil and prednisone yesterday because of his cough. Pt has hx of chf; sounds sob when speaking.

## 2017-04-17 NOTE — Telephone Encounter (Signed)
LM on voicemail to contact office to make an appointment per ED referral.

## 2017-04-17 NOTE — Progress Notes (Signed)
ANTICOAGULATION CONSULT NOTE - Initial Consult  Pharmacy Consult for warfarin Indication: atrial fibrillation  No Known Allergies  Patient Measurements: Height: 6' (182.9 cm) Weight: 216 lb (98 kg) IBW/kg (Calculated) : 77.6  Vital Signs: Temp: 97.6 F (36.4 C) (12/18 1057) Temp Source: Oral (12/18 1057) BP: 114/72 (12/18 1431) Pulse Rate: 68 (12/18 1431)  Labs: Recent Labs    04/15/17 0450 04/17/17 1138 04/17/17 1529  HGB 11.4* 12.2*  --   HCT 34.0* 37.3*  --   PLT 193 196  --   LABPROT 50.0*  --  35.9*  INR 5.55*  --  3.63  CREATININE 1.33* 1.24  --   TROPONINI  --  0.06*  --     Estimated Creatinine Clearance: 61.5 mL/min (by C-G formula based on SCr of 1.24 mg/dL).  Assessment: Pharmacy consulted to dose and monitor warfarin in this 76 year old male who was taking warfarin PTA for atrial fibrillation. Patient is being admitted with HF exacerbation which can effect INR. Of note, patient's INR has been elevated over the past several days. Last reported dose was 04/12/17.  Goal of Therapy:  INR 2-3 Monitor platelets by anticoagulation protocol: Yes   Plan:  INR = 3.63 remains elevated. Will HOLD warfarin this evening. Will recheck INR with AM labs tomorrow.  Lenis Noon, PharmD, BCPS Clinical Pharmacist 04/17/2017,4:46 PM

## 2017-04-17 NOTE — Telephone Encounter (Signed)
-----   Message from Alisa Graff, [place] sent at 04/16/2017  8:09 AM EST ----- Regarding: Please call to schedule appointment ED referral

## 2017-04-17 NOTE — H&P (Signed)
[facility] [place] NAME: Kyle Gregory    MR#:  [id number]  DATE OF BIRTH:  03/14/1941  DATE OF ADMISSION:  04/17/2017  PRIMARY CARE PHYSICIAN: Rusty Aus, MD   REQUESTING/REFERRING PHYSICIAN Dr. Owens Shark CHIEF COMPLAINT: Shortness of breath   Chief Complaint  Patient presents with  . URI    HISTORY OF PRESENT ILLNESS:  Kyle Gregory  is a 76 y.o. male with a known history of hypertension, chronic atrial fibrillation, chronic systolic heart failure with EF 25% came from home because of shortness of breath for 2 days getting worse associated with orthopnea PND, pedal edema.  Patient was here 2 times in the last 4 days that is December 14, December 16.  Came on December 14 because home health nurse checked his PT/INR and told him that his INR registering as high, advised him to go to emergency room in the ER patient found to have INR of 5.7.  And patient came back again on December 16 to make sure the INR comes down but INR still 5.5 on 16th.  Today he came because of shortness of breath and, patient's wife said oxygen number 83% on room air but by the time he came to ER saturations are more than 98%.   chest x-ray showed congestive heart failure.  follows up with Dr. Ubaldo Glassing, has history of CAD, status post bypass surgery in June complicated by mediastinitis, stroke after bypass surgery, patient had wound VAC, and tube, patient condition improved slowly and discharged from rehab to home and now he is able to walk with a walker.  Patient had a follow-up with Dr. Ubaldo Glassing on November 6.  And patient told me that saw the physician days ago and was given prednisone and antibiotic for bronchitis. PAST MEDICAL HISTORY:   Past Medical History:  Diagnosis Date  . Anemia   . Aortic stenosis   . Atrial fibrillation ([place])   . Cardiomyopathy ([place])   . Heartburn     PAST SURGICAL HISTOIRY:   Past Surgical History:  Procedure Laterality Date  .  CYSTOSCOPY W/ RETROGRADES Bilateral 08/14/2016   Procedure: CYSTOSCOPY WITH RETROGRADE PYELOGRAM;  Surgeon: Hollice Espy, MD;  Location: [facility] ORS;  Service: Urology;  Laterality: Bilateral;  . LEFT HEART CATH AND CORONARY ANGIOGRAPHY Left 09/05/2016   Procedure: Left Heart Cath and Coronary Angiography;  Surgeon: Teodoro Spray, MD;  Location: [place] [facility];  Service: Cardiovascular;  Laterality: Left;  . neck growth removal     1970's  . TONSILLECTOMY    . TRANSURETHRAL RESECTION OF BLADDER TUMOR N/A 08/14/2016   Procedure: TRANSURETHRAL RESECTION OF BLADDER TUMOR (TURBT);  Surgeon: Hollice Espy, MD;  Location: [facility] ORS;  Service: Urology;  Laterality: N/A;    SOCIAL HISTORY:   Social History   Tobacco Use  . Smoking status: Never Smoker  . Smokeless tobacco: Never Used  Substance Use Topics  . Alcohol use: No    FAMILY HISTORY:   Family History  Problem Relation Age of Onset  . Heart failure Father   . Heart failure Brother   . Atrial fibrillation Sister   . Prostate cancer Neg Hx   . Kidney cancer Neg Hx   . Bladder Cancer Neg Hx     DRUG ALLERGIES:  No Known Allergies  REVIEW OF SYSTEMS:  CONSTITUTIONAL: No fever, fatigue or weakness.  EYES: No blurred or double vision.  EARS, NOSE, AND THROAT: No tinnitus or ear pain.  RESPIRATORY: shortness of breath, pedal edema.   orthopnea. CARDIOVASCULAR: No chest pain, GASTROINTESTINAL: No nausea, vomiting, diarrhea or abdominal pain.  GENITOURINARY: No dysuria, hematuria.  ENDOCRINE: No polyuria, nocturia,  HEMATOLOGY: No anemia, easy bruising or bleeding SKIN: No rash or lesion. MUSCULOSKELETAL: No joint pain or arthritis.   NEUROLOGIC: No tingling, numbness, weakness.  PSYCHIATRY: No anxiety or depression.   MEDICATIONS AT HOME:   Prior to Admission medications   Medication Sig Start Date End Date Taking? Authorizing Provider  amiodarone (PACERONE) 200 MG tablet Take by mouth.    [provider]   ASPIRIN 81 PO Take by mouth.    [provider]  atorvastatin (LIPITOR) 80 MG tablet Take by mouth.    [provider]  Calcium Carbonate-Vitamin D (OYSTER SHELL CALCIUM 500 + D) 500-125 MG-UNIT TABS Take by mouth.    [provider]  Cyanocobalamin (VITAMIN B-12) 5000 MCG SUBL Place 5,000 mcg under the tongue daily after breakfast.    [provider]  FLUoxetine (PROZAC) 20 MG capsule Take by mouth.    [provider]  furosemide (LASIX) 20 MG tablet Take 20 mg by mouth daily after breakfast.     [provider]  Iron-Vitamin C (VITRON-C) 65-125 MG TABS Take 1 tablet by mouth daily after breakfast.     [provider]  lisinopril (PRINIVIL,ZESTRIL) 10 MG tablet Take 10 mg by mouth daily after breakfast.  06/15/16 06/15/17  [provider]  metoprolol tartrate (LOPRESSOR) 25 MG tablet Take by mouth.    [provider]  nitroGLYCERIN (NITROSTAT) 0.4 MG SL tablet Place under the tongue. 02/22/17 02/22/18  [provider]  Nutritional Supplements (PROMOD) LIQD Take by mouth.    [provider]  omeprazole (PRILOSEC) 20 MG capsule Take 20 mg by mouth daily before breakfast.     [provider]  polyethylene glycol (MIRALAX / GLYCOLAX) packet Take by mouth. 12/08/16   [provider]  senna-docusate (SENOKOT-S) 8.6-50 MG tablet Take by mouth. 10/11/16 10/11/17  [provider]  warfarin (COUMADIN) 5 MG tablet Take by mouth. 02/22/17 02/22/18  [provider]      VITAL SIGNS:  Blood pressure 127/60, pulse 69, temperature 97.6 F (36.4 C), temperature source Oral, resp. rate 18, height 6' (1.829 m), weight 98 kg (216 lb), SpO2 96 %.  PHYSICAL EXAMINATION:  GENERAL:  76 y.o.-year-old patient lying in the bed with no acute distress.  EYES: Pupils equal, round, reactive to light . No scleral icterus. Extraocular muscles intact.  HEENT: Head atraumatic, normocephalic.  Oropharynx and nasopharynx clear.  NECK:  Supple, no jugular venous distention. No thyroid enlargement, no tenderness.  LUNGS:  decreased breath sounds bilaterally. CARDIOVASCULAR: S1, S2 normal. No murmurs, rubs, or gallops.  ABDOMEN: Soft, nontender, nondistended. Bowel sounds present. No organomegaly or mass.  EXTREMITIES: 2+ pitting edema bilaterally.  Marland Kitchen  NEUROLOGIC: Cranial nerves II through XII are intact. Muscle strength 5/5 in all extremities. Sensation intact. Gait not checked.  PSYCHIATRIC: The patient is alert and oriented x 3.  SKIN: No obvious rash, lesion, or ulcer.   LABORATORY PANEL:   CBC Recent Labs  Lab 04/17/17 1138  WBC 7.9  HGB 12.2*  HCT 37.3*  PLT 196   ------------------------------------------------------------------------------------------------------------------  Chemistries  Recent Labs  Lab 04/17/17 1138  NA 132*  K 4.0  CL 94*  CO2 24  GLUCOSE 102*  BUN 27*  CREATININE 1.24  CALCIUM 9.0  AST 81*  ALT 100*  ALKPHOS  117  BILITOT 4.7*   ------------------------------------------------------------------------------------------------------------------  Cardiac Enzymes Recent Labs  Lab 04/17/17 1138  TROPONINI 0.06*   ------------------------------------------------------------------------------------------------------------------  RADIOLOGY:  Dg Chest 2 View  Result Date: 04/17/2017 CLINICAL DATA:  Cough and dyspnea. EXAM: CHEST  2 VIEW COMPARISON:  CT chest 06/09/2016 FINDINGS: CABG. Aortic valve replacement. Left atrial appendage clip. Cardiac enlargement with vascular congestion. Small pleural effusions and bibasilar atelectasis. Negative for edema IMPRESSION: Cardiac enlargement with vascular congestion and small pleural effusions consistent with mild fluid overload. Bibasilar atelectasis. Electronically Signed   By: Franchot Gallo M.D.   On: 04/17/2017 11:29    EKG:  No orders found for this or any previous visit.  IMPRESSION  AND PLAN:  *#1 acute on chronic systolic heart failure EF around 30% by recent echo.  Admit to telemetry, start IV Lasix, monitor urine output, consult cardiology Dr. Ubaldo Glassing.  He thought to have respiratory failure with O2 sat 88% on room air at home as per wife but by the time he came to ER,02 saturation  92% on room air. 2.  History of coronary artery disease status post CABG in [facility] in June and also had prostatic cardiac valve replacement that time, postoperative course complicated by delirium, CVA, atrial fibrillation with RVR, sternotomy wound requiring wound VAC, G-tube for nutritional supplements, for which improved, patient to back from rehab to home.  And his functional status improved, walking with walker at home.  Continue aspirin, beta-blockers, statins, nitrates, ACE inhibitors. 3.  Chronic A. fib with R rate controlled.  Patient is on amiodarone, beta-blockers, supratherapeutic INR during recent visits to ER, check INR today and see how the INR today, last dose of Coumadin last Thursday of last week.. #4 depression: Continue fluoxetine 20 mg daily. #4 history of bladder cancer, patient had a BCG, supposed to see Dr. Hollice Espy this week for repeat cystoscopy but because of high INR patient's wife  postponed that.  All the records are reviewed and case discussed with ED provider. Management plans discussed with the patient, family and they are in agreement.  CODE STATUS: Full code  TOTAL TIME TAKING CARE OF THIS PATIENT: 5 minutes.    Epifanio Lesches M.D on 04/17/2017 at 12:53 PM  Between 7am to 6pm - Pager - [phone]  After 6pm go to [email or web address] - password [email or web address]  Office  [phone]  CC: Primary care physician; Rusty Aus, MD  Note: This dictation was prepared with Dragon dictation along with smaller phrase technology. Any transcriptional errors that result from this process are unintentional.

## 2017-04-17 NOTE — ED Notes (Signed)
Pt assisted with toileting; pt voided 300 cc

## 2017-04-17 NOTE — ED Provider Notes (Signed)
[facility] Emergency Department Provider Note    First MD Initiated Contact with Patient 04/17/17 1111     (approximate)  I have reviewed the triage vital signs and the nursing notes.   HISTORY  Chief Complaint URI    HPI Kyle Gregory is a 76 y.o. male with below list of chronic medical conditions including congestive heart failure presents to the emergency department with low oxygen level noted by the patient's health care nurse at home.  Per the patient's wife patient's oxygen saturation was 8485% on room air.  Patient's wife states that the patient has had increasing dyspnea and cough since yesterday.  Patient denies any fever.  Patient's wife states that she has had a recent upper respiratory infection for which she was prescribed antibiotics.  She states that patient's primary care provider called in cefuroxime prednisone and a cough medicine yesterday however symptoms have persisted.  In addition patient has had a 6 pound weight gain in less than 1 week.  Patient denies any chest pain.   Past Medical History:  Diagnosis Date  . Anemia   . Aortic stenosis   . Atrial fibrillation ([place])   . Cardiomyopathy ([place])   . Heartburn     Patient Active Problem List   Diagnosis Date Noted  . Morbid obesity with body mass index of 40.0-49.9 ([place]) 07/12/2016  . Aortic stenosis, mild 07/11/2016  . 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 Aortic atherosclerosis ([place]) 06/12/2016  . B12 deficiency 06/12/2016  . Iron deficiency anemia due to chronic blood loss 06/12/2016  . Atrial fibrillation, chronic ([place]) 06/09/2016    Past Surgical History:  Procedure Laterality Date  . CYSTOSCOPY W/ RETROGRADES Bilateral 08/14/2016   Procedure: CYSTOSCOPY WITH RETROGRADE PYELOGRAM;  Surgeon: Hollice Espy, MD;  Location: [facility] ORS;  Service: Urology;  Laterality: Bilateral;  . LEFT HEART CATH AND CORONARY ANGIOGRAPHY Left 09/05/2016   Procedure: Left Heart Cath and  Coronary Angiography;  Surgeon: Teodoro Spray, MD;  Location: [place] CV LAB;  Service: Cardiovascular;  Laterality: Left;  . neck growth removal     1970's  . TONSILLECTOMY    . TRANSURETHRAL RESECTION OF BLADDER TUMOR N/A 08/14/2016   Procedure: TRANSURETHRAL RESECTION OF BLADDER TUMOR (TURBT);  Surgeon: Hollice Espy, MD;  Location: [facility] ORS;  Service: Urology;  Laterality: N/A;    Prior to Admission medications   Medication Sig Start Date End Date Taking? Authorizing Provider  amiodarone (PACERONE) 200 MG tablet Take by mouth.    [provider]  ASPIRIN 81 PO Take by mouth.    [provider]  atorvastatin (LIPITOR) 80 MG tablet Take by mouth.    [provider]  Calcium Carbonate-Vitamin D (OYSTER SHELL CALCIUM 500 + D) 500-125 MG-UNIT TABS Take by mouth.    [provider]  Cyanocobalamin (VITAMIN B-12) 5000 MCG SUBL Place 5,000 mcg under the tongue daily after breakfast.    [provider]  FLUoxetine (PROZAC) 20 MG capsule Take by mouth.    [provider]  furosemide (LASIX) 20 MG tablet Take 20 mg by mouth daily after breakfast.     [provider]  Iron-Vitamin C (VITRON-C) 65-125 MG TABS Take 1 tablet by mouth daily after breakfast.     [provider]  lisinopril (PRINIVIL,ZESTRIL) 10 MG tablet Take 10 mg by mouth daily after breakfast.  06/15/16 06/15/17  [provider]  metoprolol tartrate (LOPRESSOR) 25 MG tablet Take by mouth.    [provider]  nitroGLYCERIN (NITROSTAT) 0.4 MG SL tablet Place under the tongue. 02/22/17 02/22/18  [provider]  Nutritional Supplements (PROMOD) LIQD Take by mouth.    [provider]  omeprazole (PRILOSEC) 20 MG capsule Take 20 mg by mouth daily before breakfast.     [provider]  polyethylene glycol (MIRALAX / GLYCOLAX) packet Take by mouth. 12/08/16   [provider]  senna-docusate (SENOKOT-S) 8.6-50 MG  tablet Take by mouth. 10/11/16 10/11/17  [provider]  warfarin (COUMADIN) 5 MG tablet Take by mouth. 02/22/17 02/22/18  [provider]    Allergies Patient has no known allergies.  Family History  Problem Relation Age of Onset  . Heart failure Father   . Heart failure Brother   . Atrial fibrillation Sister   . Prostate cancer Neg Hx   . Kidney cancer Neg Hx   . Bladder Cancer Neg Hx     Social History Social History   Tobacco Use  . Smoking status: Never Smoker  . Smokeless tobacco: Never Used  Substance Use Topics  . Alcohol use: No  . Drug use: No    Review of Systems Constitutional: No fever/chills Eyes: No visual changes. ENT: No sore throat. Cardiovascular: Denies chest pain. Respiratory: Positive for cough and shortness of breath. Gastrointestinal: No abdominal pain.  No nausea, no vomiting.  No diarrhea.  No constipation. Genitourinary: Negative for dysuria. Musculoskeletal: Negative for neck pain.  Negative for back pain. Integumentary: Negative for rash. Neurological: Negative for headaches, focal weakness or numbness.   ____________________________________________   PHYSICAL EXAM:  VITAL SIGNS: ED Triage Vitals [04/17/17 1057]  Enc Vitals Group     BP 127/60     Pulse Rate 69     Resp 18     Temp 97.6 F (36.4 C)     Temp Source Oral     SpO2 96 %     Weight 98 kg (216 lb)     Height 1.829 m (6')     Head Circumference      Peak Flow      Pain Score      Pain Loc      Pain Edu?      Excl. in [place]?     Constitutional: Alert and oriented. Well appearing and in no acute distress. Eyes: Conjunctivae are normal. PERRL. EOMI. Head: Atraumatic. Mouth/Throat: Mucous membranes are moist. Oropharynx non-erythematous. Neck: No stridor.   Cardiovascular: Normal rate, regular rhythm. Good peripheral circulation. Grossly normal heart sounds. Respiratory: Normal respiratory effort.  No retractions.  Bibasilar  rhonchi Gastrointestinal: Soft and nontender. No distention.   Musculoskeletal: 2+ bilateral lateral lower extremity pitting edema. No gross deformities of extremities. Neurologic:  Normal speech and language. No gross focal neurologic deficits are appreciated.  Skin:  Skin is warm, dry and intact. No rash noted. Psychiatric: Mood and affect are normal. Speech and behavior are normal.  ____________________________________________   LABS (all labs ordered are listed, but only abnormal results are displayed)  Labs Reviewed  CBC - Abnormal; Notable for the following components:      Result Value   RBC 3.84 (*)    Hemoglobin 12.2 (*)    HCT 37.3 (*)    RDW 23.3 (*)    All other components within normal limits  COMPREHENSIVE METABOLIC PANEL - Abnormal; Notable for the following components:   Sodium 132 (*)    Chloride 94 (*)    Glucose, Bld 102 (*)    BUN  27 (*)    AST 81 (*)    ALT 100 (*)    Total Bilirubin 4.7 (*)    GFR calc non Af Amer 55 (*)    All other components within normal limits  BRAIN NATRIURETIC PEPTIDE - Abnormal; Notable for the following components:   B Natriuretic Peptide 2,081.0 (*)    All other components within normal limits  TROPONIN I - Abnormal; Notable for the following components:   Troponin I 0.06 (*)    All other components within normal limits  INFLUENZA PANEL BY PCR (TYPE A & B)   ____________________________________________  EKG  ED ECG REPORT I, [place] N Reganne Messerschmidt, the attending physician, personally viewed and interpreted this ECG.   Date: 04/17/2017  EKG Time: 11:00 AM  Rate: 70  Rhythm: Normal sinus rhythm  Axis: Normal  Intervals: Normal  ST&T Change: None  ____________________________________________  RADIOLOGY I, [place] N Orren Pietsch, personally viewed and evaluated these images (plain radiographs) as part of my medical decision making, as well as reviewing the written report by the radiologist.  Dg Chest 2 View  Result Date:  04/17/2017 CLINICAL DATA:  Cough and dyspnea. EXAM: CHEST  2 VIEW COMPARISON:  CT chest 06/09/2016 FINDINGS: CABG. Aortic valve replacement. Left atrial appendage clip. Cardiac enlargement with vascular congestion. Small pleural effusions and bibasilar atelectasis. Negative for edema IMPRESSION: Cardiac enlargement with vascular congestion and small pleural effusions consistent with mild fluid overload. Bibasilar atelectasis. Electronically Signed   By: Franchot Gallo M.D.   On: 04/17/2017 11:29     Procedures   ____________________________________________   INITIAL IMPRESSION / ASSESSMENT AND PLAN / ED COURSE  As part of my medical decision making, I reviewed the following data within the electronic MEDICAL RECORD [id number] year old male presented with history of physical exam consistent with acute on chronic CHF exacerbation.  This diagnosis is supported with chest x-ray findings of cardiomegaly with vascular congestion with small effusions.  In addition patient's BNP noted to be 2081.  As such patient given Lasix 40 mg IV discussed with Dr. Vianne Bulls for hospital admission for further evaluation and management of CHF exacerbation with oxygen demand. ____________________________________________  FINAL CLINICAL IMPRESSION(S) / ED DIAGNOSES  Final diagnoses:  Acute on chronic systolic congestive heart failure ([place])     MEDICATIONS GIVEN DURING THIS VISIT:  Medications  furosemide (LASIX) injection 40 mg (40 mg Intravenous Given 04/17/17 1142)     ED Discharge Orders    None       Note:  This document was prepared using Dragon voice recognition software and may include unintentional dictation errors.    Gregor Hams, MD 04/17/17 [phone]

## 2017-04-18 ENCOUNTER — Other Ambulatory Visit: Payer: Medicare Other | Admitting: Urology

## 2017-04-18 LAB — BASIC METABOLIC PANEL
ANION GAP: 10 (ref 5–15)
BUN: 30 mg/dL — AB (ref 6–20)
CHLORIDE: 95 mmol/L — AB (ref 101–111)
CO2: 29 mmol/L (ref 22–32)
Calcium: 8.8 mg/dL — ABNORMAL LOW (ref 8.9–10.3)
Creatinine, Ser: 1.29 mg/dL — ABNORMAL HIGH (ref 0.61–1.24)
GFR calc Af Amer: >60 mL/min (ref >60)
GFR calc non Af Amer: 52 mL/min — ABNORMAL LOW (ref >60)
GLUCOSE: 95 mg/dL (ref 65–99)
POTASSIUM: 3.5 mmol/L (ref 3.5–5.1)
Sodium: 134 mmol/L — ABNORMAL LOW (ref 135–145)

## 2017-04-18 LAB — CBC
HEMATOCRIT: 35.1 % — AB (ref 40.0–52.0)
HEMOGLOBIN: 11.4 g/dL — AB (ref 13.0–18.0)
MCH: 31.4 pg (ref 26.0–34.0)
MCHC: 32.6 g/dL (ref 32.0–36.0)
MCV: 96.4 fL (ref 80.0–100.0)
Platelets: 192 10*3/uL (ref 150–440)
RBC: 3.64 MIL/uL — ABNORMAL LOW (ref 4.40–5.90)
RDW: 23.8 % — AB (ref 11.5–14.5)
WBC: 6.9 10*3/uL (ref 3.8–10.6)

## 2017-04-18 LAB — GLUCOSE, CAPILLARY
GLUCOSE-CAPILLARY: 66 mg/dL (ref 65–99)
Glucose-Capillary: 89 mg/dL (ref 65–99)

## 2017-04-18 LAB — PROTIME-INR
INR: 3.29
Prothrombin Time: 33.2 seconds — ABNORMAL HIGH (ref 11.4–15.2)

## 2017-04-18 LAB — TROPONIN I: TROPONIN I: 0.07 ng/mL — AB (ref <0.03)

## 2017-04-18 MED ORDER — DOXYCYCLINE HYCLATE 100 MG PO TABS
100.0000 mg | ORAL_TABLET | Freq: Two times a day (BID) | ORAL | Status: DC
  Administered 2017-04-18 – 2017-04-19 (×2): 100 mg via ORAL
  Filled 2017-04-18 (×2): qty 1

## 2017-04-18 MED ORDER — PREDNISONE 10 MG PO TABS
10.0000 mg | ORAL_TABLET | Freq: Every day | ORAL | Status: DC
  Administered 2017-04-19: 10 mg via ORAL
  Filled 2017-04-18: qty 1

## 2017-04-18 NOTE — Plan of Care (Signed)
  Progressing Clinical Measurements: Respiratory complications will improve 04/18/2017 0136 - Progressing by Jeri Cos, RN

## 2017-04-18 NOTE — Progress Notes (Signed)
ANTICOAGULATION CONSULT NOTE   Pharmacy Consult for warfarin Indication: atrial fibrillation  No Known Allergies  Patient Measurements: Height: 6' (182.9 cm) Weight: 214 lb 6.4 oz (97.3 kg) IBW/kg (Calculated) : 77.6  Vital Signs: Temp: 97.6 F (36.4 C) (12/19 0739) Temp Source: Oral (12/19 0739) BP: 112/68 (12/19 0739) Pulse Rate: 71 (12/19 0739)  Labs: Recent Labs    04/17/17 1138 04/17/17 1529 04/17/17 1915 04/18/17 0108 04/18/17 0602  HGB 12.2*  --   --   --  11.4*  HCT 37.3*  --   --   --  35.1*  PLT 196  --   --   --  192  LABPROT  --  35.9*  --   --  33.2*  INR  --  3.63  --   --  3.29  CREATININE 1.24  --   --   --  1.29*  TROPONINI 0.06* 0.06* 0.06* 0.07*  --     Estimated Creatinine Clearance: 58.9 mL/min (A) (by C-G formula based on SCr of 1.29 mg/dL (H)).  Assessment: Pharmacy consulted to dose and monitor warfarin in this 76 year old male who was taking warfarin PTA for atrial fibrillation. Patient is being admitted with HF exacerbation which can effect INR. Of note, patient's INR has been elevated over the past several days. Last reported dose was 04/12/17.  12/18  INR 3.63  Held 12/19 INR 3.29  Goal of Therapy:  INR 2-3 - confirmed per Dr Bethanne Ginger clinic note on 03/26/17 Monitor platelets by anticoagulation protocol: Yes   Plan:  INR = 3.29 remains elevated. Will HOLD warfarin this evening. Will recheck INR with AM labs tomorrow. Hgb and plt count have been stable.   Rocky Morel, PharmD, BCPS Clinical Pharmacist 04/18/2017,12:38 PM

## 2017-04-18 NOTE — Progress Notes (Signed)
Rounded on patient.  Patient admitted with dx of acute on chronic HF with EF of 30% on recent echo.  Patient has hx of CAD and is s/p CABG at [facility] in June 2018.  Patient also s/p valve replacement. Patient's postoperative cours was complicated by delirium, CVA, a-fib with RV, and sternotomy wound infection which required wound vac.  Patient also required G-tube for nutritional support.  Patient went to rehab facility post discharge.  Patient is now back at home ambulating with a walker.  Patient on ASA, beta blocker, statin, nitrate, ace inhibitor.  Patient also has hx of depression a d bladder cancer.    CHF Education:  Not Completed Attempted to educate patient about CHF. Patient would like wife to be present when covering over education.    Sticky note placed in [facility] for nursing staff to educate patient and wife on HF.  "Living Better with Heart Failure" booklet along with information on low sodium diet left at patient's bedside.    Patient has an appointment scheduled in the [facility] on May 02, 2017 at 10 a.m.    Roanna Epley, RN, BSN, [facility] Cardiovascular and Pulmonary Nurse Navigator

## 2017-04-18 NOTE — Plan of Care (Signed)
  Progressing Education: Knowledge of General Education information will improve 04/18/2017 1521 - Progressing by Darrelyn Hillock, RN Health Behavior/Discharge Planning: Ability to manage health-related needs will improve 04/18/2017 1521 - Progressing by Darrelyn Hillock, RN Clinical Measurements: Ability to maintain clinical measurements within normal limits will improve 04/18/2017 1521 - Progressing by Darrelyn Hillock, RN Will remain free from infection 04/18/2017 1521 - Progressing by Darrelyn Hillock, RN Diagnostic test results will improve 04/18/2017 1521 - Progressing by Darrelyn Hillock, RN Respiratory complications will improve 04/18/2017 1521 - Progressing by Darrelyn Hillock, RN Cardiovascular complication will be avoided 04/18/2017 1521 - Progressing by Darrelyn Hillock, RN Activity: Risk for activity intolerance will decrease 04/18/2017 1521 - Progressing by Darrelyn Hillock, RN Nutrition: Adequate nutrition will be maintained 04/18/2017 1521 - Progressing by Darrelyn Hillock, RN Coping: Level of anxiety will decrease 04/18/2017 1521 - Progressing by Darrelyn Hillock, RN Elimination: Will not experience complications related to bowel motility 04/18/2017 1521 - Progressing by Darrelyn Hillock, RN Will not experience complications related to urinary retention 04/18/2017 1521 - Progressing by Darrelyn Hillock, RN Pain Managment: General experience of comfort will improve 04/18/2017 1521 - Progressing by Darrelyn Hillock, RN Safety: Ability to remain free from injury will improve 04/18/2017 1521 - Progressing by Darrelyn Hillock, RN Skin Integrity: Risk for impaired skin integrity will decrease 04/18/2017 1521 - Progressing by Darrelyn Hillock, RN

## 2017-04-18 NOTE — Progress Notes (Signed)
[facility]                                                                                                                                                                                  Patient Demographics   Kyle Gregory, is a 76 y.o. male, DOB - Jul 14, 1940, XBM:[id number]  Admit date - 04/17/2017   Admitting Physician Herminio Commons, MD  Outpatient Primary MD for the patient is Rusty Aus, MD   LOS - 1  Subjective: Patient feeling much better has diuresed well He was also recently treated for acute bronchitis   Review of Systems:   CONSTITUTIONAL: No documented fever. No fatigue, weakness. No weight gain, no weight loss.  EYES: No blurry or double vision.  ENT: No tinnitus. No postnasal drip. No redness of the oropharynx.  RESPIRATORY: No cough, no wheeze, no hemoptysis. Positive dyspnea.  CARDIOVASCULAR: No chest pain. No orthopnea. No palpitations. No syncope.  GASTROINTESTINAL: No nausea, no vomiting or diarrhea. No abdominal pain. No melena or hematochezia.  GENITOURINARY: No dysuria or hematuria.  ENDOCRINE: No polyuria or nocturia. No heat or cold intolerance.  HEMATOLOGY: No anemia. No bruising. No bleeding.  INTEGUMENTARY: No rashes. No lesions.  MUSCULOSKELETAL: No arthritis. No swelling. No gout.  NEUROLOGIC: No numbness, tingling, or ataxia. No seizure-type activity.  PSYCHIATRIC: No anxiety. No insomnia. No ADD.    Vitals:   Vitals:   04/17/17 1944 04/18/17 0437 04/18/17 0500 04/18/17 0739  BP: (!) 109/58 109/68  112/68  Pulse: 63 69  71  Resp: 18   18  Temp: 97.7 F (36.5 C)   97.6 F (36.4 C)  TempSrc: Oral   Oral  SpO2: 93% 95%  91%  Weight:   214 lb 6.4 oz (97.3 kg)   Height:        Wt Readings from Last 3 Encounters:  04/18/17 214 lb 6.4 oz (97.3 kg)  04/15/17 216 lb (98 kg)  03/15/17 204 lb (92.5 kg)     Intake/Output Summary (Last 24 hours) at 04/18/2017 1620 Last data filed at 04/18/2017  1437 Gross per 24 hour  Intake 120 ml  Output -  Net 120 ml    Physical Exam:   GENERAL: Pleasant-appearing in no apparent distress.  HEAD, EYES, EARS, NOSE AND THROAT: Atraumatic, normocephalic. Extraocular muscles are intact. Pupils equal and reactive to light. Sclerae anicteric. No conjunctival injection. No oro-pharyngeal erythema.  NECK: Supple. There is no jugular venous distention. No bruits, no lymphadenopathy, no thyromegaly.  HEART: Regular rate and rhythm,. No murmurs, no rubs, no clicks.  LUNGS: Bilateral crackles throughout both lungs at the bases ABDOMEN: Soft, flat, nontender,  nondistended. Has good bowel sounds. No hepatosplenomegaly appreciated.  EXTREMITIES: No evidence of any cyanosis, clubbing, or 2+ peripheral edema.  +2 pedal and radial pulses bilaterally.  NEUROLOGIC: The patient is alert, awake, and oriented x3 with no focal motor or sensory deficits appreciated bilaterally.  SKIN: Moist and warm with no rashes appreciated.  Psych: Not anxious, depressed LN: No inguinal LN enlargement    Antibiotics   Anti-infectives (From admission, onward)   Start     Dose/Rate Route Frequency Ordered Stop   04/18/17 2200  doxycycline (VIBRA-TABS) tablet 100 mg     100 mg Oral Every 12 hours 04/18/17 1530        Medications   Scheduled Meds: . amiodarone  200 mg Oral Daily  . atorvastatin  80 mg Oral q1800  . docusate sodium  100 mg Oral BID  . doxycycline  100 mg Oral Q12H  . ferrous sulfate  325 mg Oral QPC breakfast   And  . vitamin C  250 mg Oral QPC breakfast  . FLUoxetine  10 mg Oral Daily  . furosemide  40 mg Intravenous Q12H  . lisinopril  10 mg Oral QPC breakfast  . metoprolol tartrate  12.5 mg Oral BID  . [START ON 04/19/2017] predniSONE  10 mg Oral Q breakfast   Continuous Infusions: PRN Meds:.acetaminophen **OR** acetaminophen, bisacodyl, HYDROcodone-acetaminophen, ondansetron **OR** ondansetron (ZOFRAN) IV, traZODone   Data Review:   Micro  Results No results found for this or any previous visit (from the past 240 hour(s)).  Radiology Reports Dg Chest 2 View  Result Date: 04/17/2017 CLINICAL DATA:  Cough and dyspnea. EXAM: CHEST  2 VIEW COMPARISON:  CT chest 06/09/2016 FINDINGS: CABG. Aortic valve replacement. Left atrial appendage clip. Cardiac enlargement with vascular congestion. Small pleural effusions and bibasilar atelectasis. Negative for edema IMPRESSION: Cardiac enlargement with vascular congestion and small pleural effusions consistent with mild fluid overload. Bibasilar atelectasis. Electronically Signed   By: Franchot Gallo M.D.   On: 04/17/2017 11:29   Ct Head Wo Contrast  Result Date: 04/13/2017 CLINICAL DATA:  76 year old male with coagulopathy on warfarin. Reason confusion. EXAM: CT HEAD WITHOUT CONTRAST TECHNIQUE: Contiguous axial images were obtained from the base of the skull through the vertex without intravenous contrast. COMPARISON:  None. FINDINGS: Brain: Patchy and confluent bilateral cerebral white matter hypodensity. Generalized cerebral volume loss. No midline shift, ventriculomegaly, mass effect, evidence of mass lesion, intracranial hemorrhage or evidence of cortically based acute infarction. Gray-white matter differentiation is within normal limits throughout the brain. No cortical encephalomalacia identified. Vascular: Calcified atherosclerosis at the skull base. No suspicious intracranial vascular hyperdensity. Skull: Negative.  No acute osseous abnormality identified. Sinuses/Orbits: Clear. Other: Visualized orbits and scalp soft tissues are within normal limits. IMPRESSION: 1.  No acute intracranial abnormality. 2. Moderately advanced cerebral white matter hypodensity, most commonly due to chronic small vessel disease. Electronically Signed   By: Genevie Ann M.D.   On: 04/13/2017 14:04     CBC Recent Labs  Lab 04/13/17 1143 04/15/17 0450 04/17/17 1138 04/18/17 0602  WBC 6.1 7.8 7.9 6.9  HGB 11.8*  11.4* 12.2* 11.4*  HCT 36.2* 34.0* 37.3* 35.1*  PLT 188 193 196 192  MCV 94.9 95.4 97.0 96.4  MCH 31.0 31.9 31.7 31.4  MCHC 32.6 33.4 32.7 32.6  RDW 22.2* 22.8* 23.3* 23.8*  LYMPHSABS  --  0.5*  --   --   MONOABS  --  0.6  --   --   EOSABS  --  0.1  --   --   BASOSABS  --  0.1  --   --     Chemistries  Recent Labs  Lab 04/13/17 1143 04/15/17 0450 04/17/17 1138 04/18/17 0602  NA 134* 131* 132* 134*  K 4.0 3.9 4.0 3.5  CL 97* 94* 94* 95*  CO2 26 25 24 29   GLUCOSE 114* 108* 102* 95  BUN 28* 31* 27* 30*  CREATININE 1.32* 1.33* 1.24 1.29*  CALCIUM 8.9 8.8* 9.0 8.8*  AST  --  54* 81*  --   ALT  --  57 100*  --   ALKPHOS  --  103 117  --   BILITOT  --  4.2* 4.7*  --    ------------------------------------------------------------------------------------------------------------------ estimated creatinine clearance is 58.9 mL/min (A) (by C-G formula based on SCr of 1.29 mg/dL (H)). ------------------------------------------------------------------------------------------------------------------ No results for input(s): HGBA1C in the last 72 hours. ------------------------------------------------------------------------------------------------------------------ No results for input(s): CHOL, HDL, LDLCALC, TRIG, CHOLHDL, LDLDIRECT in the last 72 hours. ------------------------------------------------------------------------------------------------------------------ No results for input(s): TSH, T4TOTAL, T3FREE, THYROIDAB in the last 72 hours.  Invalid input(s): FREET3 ------------------------------------------------------------------------------------------------------------------ No results for input(s): VITAMINB12, FOLATE, FERRITIN, TIBC, IRON, RETICCTPCT in the last 72 hours.  Coagulation profile Recent Labs  Lab 04/13/17 1143 04/15/17 0450 04/17/17 1529 04/18/17 0602  INR 5.72* 5.55* 3.63 3.29    No results for input(s): DDIMER in the last 72 hours.  Cardiac  Enzymes Recent Labs  Lab 04/17/17 1529 04/17/17 1915 04/18/17 0108  TROPONINI 0.06* 0.06* 0.07*   ------------------------------------------------------------------------------------------------------------------ Invalid input(s): POCBNP    Assessment & Plan  Patient is a 76 year old with history of CHF presenting with worsening shortness of breath 1 acute on chronic systolic heart failure EF around 30% by recent echo.  Continue IV Lasix, patient has responded to diuresis  2.   recent bronchitis continue Ceftin and prednisone was taking at home   3.   Chronic A. fib with R rate controlled.  Patient is on amiodarone, beta-blockers, supratherapeutic INR Coumadin on hold due to elevated INR continue to hold heart rate controlled  4. Coronary artery disease continue beta blockers aspirin therapy   5.  history of bladder cancer outpatient follow-up with urology  6. Miscellaneous coumadin        Code Status Orders  (From admission, onward)        Start     Ordered   04/17/17 1311  Full code  Continuous     04/17/17 1310    Code Status History    Date Active Date Inactive Code Status Order ID Comments User Context   This patient has a current code status but no historical code status.           Consults  none  DVT Prophylaxis  coumadin Lab Results  Component Value Date   PLT 192 04/18/2017     Time Spent in minutes  65min Greater than 50% of time spent in care coordination and counseling patient regarding the condition and plan of care.   Dustin Flock M.D on 04/18/2017 at 4:20 PM  Between 7am to 6pm - Pager - [phone]  After 6pm go to [email or web address] - password [email or web address] [facility]   Office  [phone]

## 2017-04-18 NOTE — Care Management Note (Signed)
Case Management Note  Patient Details  Name: ESTUS KRAKOWSKI MRN: [id number] Date of Birth: July 08, 1940  Subjective/Objective:                  This RNCM spoke with patient's wife by phone. She states that [employer] home health RN and PT is following patient. Patient ambulates with walker. He is not on Home O2.   Action/Plan: Sharmon Revere with [facility] notified of patient admission.   Expected Discharge Date:  04/19/17               Expected Discharge Plan:     In-House Referral:     Discharge planning Services  CM Consult  Post Acute Care Choice:  Home Health Choice offered to:  Spouse  DME Arranged:    DME Agency:     HH Arranged:  RN, PT HH Agency:  [facility]  Status of Service:  In process, will continue to follow  If discussed at Long Length of Stay Meetings, dates discussed:    Additional Comments:  Marshell Garfinkel, RN 04/18/2017, 9:08 AM

## 2017-04-19 LAB — BASIC METABOLIC PANEL
Anion gap: 5 (ref 5–15)
BUN: 27 mg/dL — AB (ref 6–20)
CO2: 29 mmol/L (ref 22–32)
CREATININE: 1.1 mg/dL (ref 0.61–1.24)
Calcium: 8.2 mg/dL — ABNORMAL LOW (ref 8.9–10.3)
Chloride: 97 mmol/L — ABNORMAL LOW (ref 101–111)
GFR calc Af Amer: >60 mL/min (ref >60)
Glucose, Bld: 95 mg/dL (ref 65–99)
Potassium: 3.1 mmol/L — ABNORMAL LOW (ref 3.5–5.1)
SODIUM: 131 mmol/L — AB (ref 135–145)

## 2017-04-19 LAB — GLUCOSE, CAPILLARY: GLUCOSE-CAPILLARY: 92 mg/dL (ref 65–99)

## 2017-04-19 LAB — PROTIME-INR
INR: 2.73
Prothrombin Time: 28.7 seconds — ABNORMAL HIGH (ref 11.4–15.2)

## 2017-04-19 MED ORDER — POTASSIUM CHLORIDE CRYS ER 20 MEQ PO TBCR
40.0000 meq | EXTENDED_RELEASE_TABLET | Freq: Once | ORAL | Status: AC
  Administered 2017-04-19: 40 meq via ORAL
  Filled 2017-04-19: qty 2

## 2017-04-19 MED ORDER — WARFARIN SODIUM 2.5 MG PO TABS: 5.0000 mg | ORAL_TABLET | Freq: Every day | ORAL | 11 refills | Status: DC

## 2017-04-19 MED ORDER — WARFARIN - PHARMACIST DOSING INPATIENT: Freq: Every day | Status: DC

## 2017-04-19 MED ORDER — POTASSIUM CHLORIDE ER 20 MEQ PO TBCR: 20.0000 meq | EXTENDED_RELEASE_TABLET | Freq: Every day | ORAL | 0 refills | Status: DC

## 2017-04-19 MED ORDER — WARFARIN SODIUM 2.5 MG PO TABS
2.5000 mg | ORAL_TABLET | Freq: Once | ORAL | Status: DC
  Filled 2017-04-19: qty 1

## 2017-04-19 MED ORDER — FUROSEMIDE 40 MG PO TABS: 40.0000 mg | ORAL_TABLET | Freq: Two times a day (BID) | ORAL | 0 refills | Status: DC

## 2017-04-19 NOTE — Plan of Care (Signed)
  Progressing Cardiac: Ability to achieve and maintain adequate cardiopulmonary perfusion will improve 04/19/2017 0537 - Progressing by Jeri Cos, RN

## 2017-04-19 NOTE — Progress Notes (Signed)
ANTICOAGULATION CONSULT NOTE   Pharmacy Consult for warfarin Indication: atrial fibrillation  No Known Allergies  Patient Measurements: Height: 6' (182.9 cm) Weight: 210 lb 6.4 oz (95.4 kg) IBW/kg (Calculated) : 77.6  Vital Signs: Temp: 97.9 F (36.6 C) (12/20 0836) Temp Source: Oral (12/20 0441) BP: 114/61 (12/20 0836) Pulse Rate: 83 (12/20 0836)  Labs: Recent Labs    04/17/17 1138 04/17/17 1529 04/17/17 1915 04/18/17 0108 04/18/17 0602 04/19/17 0308  HGB 12.2*  --   --   --  11.4*  --   HCT 37.3*  --   --   --  35.1*  --   PLT 196  --   --   --  192  --   LABPROT  --  35.9*  --   --  33.2* 28.7*  INR  --  3.63  --   --  3.29 2.73  CREATININE 1.24  --   --   --  1.29* 1.10  TROPONINI 0.06* 0.06* 0.06* 0.07*  --   --     Estimated Creatinine Clearance: 68.4 mL/min (by C-G formula based on SCr of 1.1 mg/dL).  Assessment: Pharmacy consulted to dose and monitor warfarin in this 76 year old male who was taking warfarin PTA for atrial fibrillation. Patient is being admitted with HF exacerbation which can effect INR. Of note, patient's INR has been elevated over the past several days. Last reported dose was 04/12/17.  Home dose: Warfarin 7.5 mg PO daily  12/18  INR 3.63  Held 12/19 INR 3.29 Held 12/20 INR 2.7   Goal of Therapy:  INR 2-3 - confirmed per Dr Bethanne Ginger clinic note on 03/26/17 Monitor platelets by anticoagulation protocol: Yes   Plan:  INR is therapeutic today. Spoke with patient and wife about warfarin - no recent diet or medication changes that likely contributed to elevated INR. Patient was on ceftin prior to admission and is currently on doxycyline.   Will give warfarin 2.5 mg PO this evening. Patient to be discharged on a lower dose of warfarin than taking PTA.  Lenis Noon, PharmD, BCPS Clinical Pharmacist 04/19/2017,10:48 AM

## 2017-04-19 NOTE — Discharge Instructions (Signed)
[place] at [place]:  Cardiac diet  DISCHARGE CONDITION:  Stable  ACTIVITY:  Activity as tolerated  OXYGEN:  Home Oxygen: No.   Oxygen Delivery: room air  DISCHARGE LOCATION:  home    ADDITIONAL DISCHARGE INSTRUCTION: please notify primary md or primary cardiologist if you gain more than 2 lbs in one day Please watch salt intake  F/u at [facility] clinic as scheduled  INR check on monday   If you experience worsening of your admission symptoms, develop shortness of breath, life threatening emergency, suicidal or homicidal thoughts you must seek medical attention immediately by calling 911 or calling your MD immediately  if symptoms less severe.  You Must read complete instructions/literature along with all the possible adverse reactions/side effects for all the Medicines you take and that have been prescribed to you. Take any new Medicines after you have completely understood and accpet all the possible adverse reactions/side effects.   Please note  You were cared for by a hospitalist during your hospital stay. If you have any questions about your discharge medications or the care you received while you were in the hospital after you are discharged, you can call the unit and asked to speak with the hospitalist on call if the hospitalist that took care of you is not available. Once you are discharged, your primary care physician will handle any further medical issues. Please note that NO REFILLS for any discharge medications will be authorized once you are discharged, as it is imperative that you return to your primary care physician (or establish a relationship with a primary care physician if you do not have one) for your aftercare needs so that they can reassess your need for medications and monitor your lab values.

## 2017-04-19 NOTE — Discharge Summary (Signed)
Kyle Gregory at [place], 76 y.o., DOB January 22, 1941, MRN [id number]. Admission date: 04/17/2017 Discharge Date 04/19/2017 Primary MD Rusty Aus, MD Admitting Physician Herminio Commons, MD  Admission Diagnosis  Acute on chronic systolic congestive heart failure [facility]) [I50.23]  Discharge Diagnosis   Active Problems:   Acute on chronic systolic CHF (congestive heart failure) (HCC) Recent bronchitis status post treatment Chronic atrial fibrillation Coronary artery disease History of bladder cancer Elevated INR now INR therapeutic needs outpatient INR check next week        Kyle Gregory  is a 76 y.o. male with a known history of hypertension, chronic atrial fibrillation, chronic systolic heart failure with EF 25% came from home because of shortness of breath for 2 days getting worse associated with orthopnea PND, pedal edema.  Patient was noted to have systolic CHF and he was treated with IV Lasix.  With significant improvement in his symptoms.  He was also recently diagnosed with bronchitis though symptoms have resolved.  His INR was elevated on admission and his Coumadin was held for 2 days.  Now he is restarted on Coumadin back.  INR is therapeutic. Patient clinically is doing much better stable for discharge. Have asked him to follow-up in the CHF clinic           Consults  None  Significant Tests:  See full reports for all details     Dg Chest 2 View  Result Date: 04/17/2017 CLINICAL DATA:  Cough and dyspnea. EXAM: CHEST  2 VIEW COMPARISON:  CT chest 06/09/2016 FINDINGS: CABG. Aortic valve replacement. Left atrial appendage clip. Cardiac enlargement with vascular congestion. Small pleural effusions and bibasilar atelectasis. Negative for edema IMPRESSION: Cardiac enlargement with vascular congestion and small pleural effusions consistent with mild fluid overload. Bibasilar atelectasis. Electronically Signed    By: Franchot Gallo M.D.   On: 04/17/2017 11:29   Ct Head Wo Contrast  Result Date: 04/13/2017 CLINICAL DATA:  76 year old male with coagulopathy on warfarin. Reason confusion. EXAM: CT HEAD WITHOUT CONTRAST TECHNIQUE: Contiguous axial images were obtained from the base of the skull through the vertex without intravenous contrast. COMPARISON:  None. FINDINGS: Brain: Patchy and confluent bilateral cerebral white matter hypodensity. Generalized cerebral volume loss. No midline shift, ventriculomegaly, mass effect, evidence of mass lesion, intracranial hemorrhage or evidence of cortically based acute infarction. Gray-white matter differentiation is within normal limits throughout the brain. No cortical encephalomalacia identified. Vascular: Calcified atherosclerosis at the skull base. No suspicious intracranial vascular hyperdensity. Skull: Negative.  No acute osseous abnormality identified. Sinuses/Orbits: Clear. Other: Visualized orbits and scalp soft tissues are within normal limits. IMPRESSION: 1.  No acute intracranial abnormality. 2. Moderately advanced cerebral white matter hypodensity, most commonly due to chronic small vessel disease. Electronically Signed   By: Genevie Ann M.D.   On: 04/13/2017 14:04       Today   Subjective:   Kyle Gregory patient doing much better shortness of breath resolved  Objective:   Blood pressure 114/61, pulse 83, temperature 97.9 F (36.6 C), resp. rate 15, height 6' (1.829 m), weight 210 lb 6.4 oz (95.4 kg), SpO2 92 %.  .  Intake/Output Summary (Last 24 hours) at 04/19/2017 1609 Last data filed at 04/19/2017 0947 Gross per 24 hour  Intake 240 ml  Output 800 ml  Net -560 ml    Exam VITAL SIGNS: Blood pressure 114/61, pulse 83, temperature 97.9 F (36.6 C), resp. rate 15, height 6' (  1.829 m), weight 210 lb 6.4 oz (95.4 kg), SpO2 92 %.  GENERAL:  76 y.o.-year-old patient lying in the bed with no acute distress.  EYES: Pupils equal, round, reactive to  light and accommodation. No scleral icterus. Extraocular muscles intact.  HEENT: Head atraumatic, normocephalic. Oropharynx and nasopharynx clear.  NECK:  Supple, no jugular venous distention. No thyroid enlargement, no tenderness.  LUNGS: Normal breath sounds bilaterally, no wheezing, rales,rhonchi or crepitation. No use of accessory muscles of respiration.  CARDIOVASCULAR: S1, S2 normal. No murmurs, rubs, or gallops.  ABDOMEN: Soft, nontender, nondistended. Bowel sounds present. No organomegaly or mass.  EXTREMITIES: No pedal edema, cyanosis, or clubbing.  NEUROLOGIC: Cranial nerves II through XII are intact. Muscle strength 5/5 in all extremities. Sensation intact. Gait not checked.  PSYCHIATRIC: The patient is alert and oriented x 3.  SKIN: No obvious rash, lesion, or ulcer.   Data Review     CBC w Diff:  Lab Results  Component Value Date   WBC 6.9 04/18/2017   HGB 11.4 (L) 04/18/2017   HCT 35.1 (L) 04/18/2017   PLT 192 04/18/2017   LYMPHOPCT 7 04/15/2017   MONOPCT 8 04/15/2017   EOSPCT 1 04/15/2017   BASOPCT 1 04/15/2017   CMP:  Lab Results  Component Value Date   NA 131 (L) 04/19/2017   K 3.1 (L) 04/19/2017   CL 97 (L) 04/19/2017   CO2 29 04/19/2017   BUN 27 (H) 04/19/2017   CREATININE 1.10 04/19/2017   PROT 7.1 04/17/2017   ALBUMIN 3.7 04/17/2017   BILITOT 4.7 (H) 04/17/2017   ALKPHOS 117 04/17/2017   AST 81 (H) 04/17/2017   ALT 100 (H) 04/17/2017  .  Micro Results No results found for this or any previous visit (from the past 240 hour(s)).      Code Status Orders  (From admission, onward)        Start     Ordered   04/17/17 1311  Full code  Continuous     04/17/17 1310    Code Status History    Date Active Date Inactive Code Status Order ID Comments User Context   This patient has a current code status but no historical code status.          Follow-up Information    Rusty Aus, MD Follow up on 04/26/2017.   Specialty:  Internal  Medicine Why:  @9 :15 AM  Contact information: [place] [address] [phone]        [facility] clinic Follow up.   Why:  already scheduled          Discharge Medications   Allergies as of 04/19/2017   No Known Allergies     Medication List    STOP taking these medications   cefUROXime 250 MG tablet Commonly known as:  CEFTIN   predniSONE 10 MG tablet Commonly known as:  DELTASONE     TAKE these medications   amiodarone 200 MG tablet Commonly known as:  PACERONE Take 200 mg by mouth daily.   ASPIRIN 81 PO Take 1 tablet by mouth daily.   atorvastatin 80 MG tablet Commonly known as:  LIPITOR Take 80 mg by mouth daily.   ferrous sulfate 325 (65 FE) MG tablet Take 325 mg by mouth 2 (two) times daily with a meal.   FLUoxetine 20 MG capsule Commonly known as:  PROZAC Take 20 mg by mouth daily.   furosemide 40 MG tablet Commonly  known as:  LASIX Take 1 tablet (40 mg total) by mouth 2 (two) times daily. What changed:    medication strength  how much to take  when to take this   lidocaine 5 % Commonly known as:  LIDODERM Place 1 patch onto the skin every 12 (twelve) hours. Remove & Discard patch within 12 hours or as directed by MD   Melatonin 3 MG Tabs Take 1 tablet by mouth at bedtime.   metoprolol tartrate 25 MG tablet Commonly known as:  LOPRESSOR Take 12.5 mg by mouth 2 (two) times daily.   nitroGLYCERIN 0.4 MG SL tablet Commonly known as:  NITROSTAT Place under the tongue.   omeprazole 20 MG capsule Commonly known as:  PRILOSEC Take 20 mg by mouth daily before breakfast.   OYSTER SHELL CALCIUM 500 + D 500-125 MG-UNIT Tabs Generic drug:  Calcium Carbonate-Vitamin D Take 1 tablet by mouth daily.   polyethylene glycol packet Commonly known as:  MIRALAX / GLYCOLAX Take by mouth.   Potassium Chloride ER 20 MEQ Tbcr Take 20 mEq by mouth daily for 3 days.   pravastatin 40 MG  tablet Commonly known as:  PRAVACHOL Take 1 tablet by mouth daily.   PROMOD Liqd Take by mouth.   senna-docusate 8.6-50 MG tablet Commonly known as:  Senokot-S Take by mouth.   warfarin 2.5 MG tablet Commonly known as:  COUMADIN Take 2 tablets (5 mg total) by mouth at bedtime. What changed:  how much to take          Total Time in preparing paper work, data evaluation and todays exam - 35 minutes  Dustin Flock M.D on 04/19/2017 at 4:09 [facility]  [facility]   Office  [phone]

## 2017-04-19 NOTE — Care Management (Signed)
This RNCM has notified Sharmon Revere with [facility] home [facility] of patient discharge. No other RNCM needs.

## 2017-04-19 NOTE — Care Management (Signed)
This RNCM received call from bedside nurse and he confirmed that patient will not need home O2. No other RNCM needs.

## 2017-04-19 NOTE — Progress Notes (Addendum)
Rounded on patient to complete CHF Education:    Wife and daughter at bedside.    CHF Education:?? Educational session with patient/family completed.  Wife holding "Living Better with Heart Failure" booklet in her lap.  Patient's wife informed this RN that she has read the booklet.   Briefly reviewed definition of heart failure and signs and symptoms of an exacerbation.?Explained to patient that HF is a chronic illness which requires self-assessment / self-management along with help from the cardiologist/PCP/Heart Failure Clinic.   ?? ?*Reviewed importance of and reason behind checking weight daily in the AM, after using the bathroom, but before getting dressed. Wife informed this RN she helps patient with bathing in the morning and then she will assist with weighing.    Reviewed the following information with patient:  *Discussed when to call the Dr= weight gain of >2-3lb overnight of 5lb in a week,  *Discussed yellow zone= call MD: weight gain of >2-3lb overnight of 5lb in a week, increased swelling, increased SOB when lying down, chest discomfort, dizziness, increased fatigue *Red Zone= call 911: struggle to breath, fainting or near fainting, significant chest pain  ? *Reviewed low sodium diet-provided handout of recommended and not recommended foods.  Reviewed reading labels with patient/family. Discussed fluid intake with patient as well. Patient not currently on a fluid restriction, but advised no more than 8-8 ounces glass of fluids per day.?  *Instructed patient to take medications as prescribed for heart failure. Explained briefly why pt is on the medications (either make you feel better, live longer or keep you out of the hospital) and discussed monitoring and side effects.  ? *Discussed exercise.  Patient currently receiving in-home RN and PT services from [facility].  Discussed participation in [place] when in-home PT completed.  Cardiac Rehab brochure and department phone provided  to patient/family.  Patient/family agreeable to this plan.  Patient's PCP:   Dr. Emily Filbert.  Cardiologist:  Dr. Ubaldo Glassing.    *Smoking Cessation - Never Smoker.    *[facility] - new patient appointment scheduled for 05/02/2017 at 10 a.m.  Explained the purpose of the HF Clinic.  Explained to patient / family the HF Clinic does not replace PCP nor Cardiologist, but is an additional resource to helping patient manage heart failure at home.    Once again, the 5 Steps to Living Better with Heart Failure were reviewed.    Patient/wife/daughter engaged in discussion and asked pertinent questions.  Patient/family thanked me for providing the above information.  Roanna Epley, RN, BSN, [facility] Cardiovascular and Pulmonary Nurse Navigator ?

## 2017-05-02 ENCOUNTER — Ambulatory Visit: Payer: Medicare Other | Attending: Family | Admitting: Family

## 2017-05-02 ENCOUNTER — Other Ambulatory Visit: Payer: Self-pay

## 2017-05-02 ENCOUNTER — Encounter: Payer: Self-pay | Admitting: Family

## 2017-05-02 VITALS — BP 113/63 | HR 69 | Resp 18 | Ht 72.0 in | Wt 211.2 lb

## 2017-05-02 DIAGNOSIS — I350 Nonrheumatic aortic (valve) stenosis: Secondary | ICD-10-CM | POA: Insufficient documentation

## 2017-05-02 DIAGNOSIS — Z8249 Family history of ischemic heart disease and other diseases of the circulatory system: Secondary | ICD-10-CM | POA: Insufficient documentation

## 2017-05-02 DIAGNOSIS — Z951 Presence of aortocoronary bypass graft: Secondary | ICD-10-CM | POA: Insufficient documentation

## 2017-05-02 DIAGNOSIS — I890 Lymphedema, not elsewhere classified: Secondary | ICD-10-CM | POA: Insufficient documentation

## 2017-05-02 DIAGNOSIS — D649 Anemia, unspecified: Secondary | ICD-10-CM | POA: Insufficient documentation

## 2017-05-02 DIAGNOSIS — R0602 Shortness of breath: Secondary | ICD-10-CM | POA: Diagnosis not present

## 2017-05-02 DIAGNOSIS — I429 Cardiomyopathy, unspecified: Secondary | ICD-10-CM | POA: Diagnosis not present

## 2017-05-02 DIAGNOSIS — Z7982 Long term (current) use of aspirin: Secondary | ICD-10-CM | POA: Insufficient documentation

## 2017-05-02 DIAGNOSIS — I5022 Chronic systolic (congestive) heart failure: Secondary | ICD-10-CM | POA: Diagnosis not present

## 2017-05-02 DIAGNOSIS — Z79899 Other long term (current) drug therapy: Secondary | ICD-10-CM | POA: Insufficient documentation

## 2017-05-02 DIAGNOSIS — Z9889 Other specified postprocedural states: Secondary | ICD-10-CM | POA: Diagnosis not present

## 2017-05-02 DIAGNOSIS — I4891 Unspecified atrial fibrillation: Secondary | ICD-10-CM | POA: Diagnosis not present

## 2017-05-02 DIAGNOSIS — Z952 Presence of prosthetic heart valve: Secondary | ICD-10-CM | POA: Diagnosis not present

## 2017-05-02 DIAGNOSIS — I482 Chronic atrial fibrillation, unspecified: Secondary | ICD-10-CM

## 2017-05-02 NOTE — Progress Notes (Signed)
Patient ID: Kyle Gregory, male    DOB: 1940/08/14, 77 y.o.   MRN: [id number]  HPI  Kyle Gregory is a 77 y/o male with a history of atrial fibrillation, aortic stenosis, anemia and chronic heart failure.   Echo report from 10/12/16 reviewed and showed an EF of 30% along with mild Kyle/TR. Cardiac catheterization done 09/05/16 showed multi-vessel disease with an estimated EF of 25-35%.   Admitted 04/17/17 due to HF exacerbation. Initially treated with IV furosemide and then transitioned to oral diuretics. Discharged after 2 days. Was in the ED 04/15/17 due to HF exacerbation. Given IV lasix and discharged home. Was in the ED 04/13/17 due to supratherapeutic INR but without evidence of hemorrhage. CT done which showed no acute abnormality. Discharged home after medications adjusted.   He presents today for his initial visit with a chief complaint of moderate fatigue upon minimal exertion. He describes this as being present for several years with varying levels of severity. He has associated shortness of breath, light-headedness, edema and difficulty sleeping. He denies any chest pain, palpitations, abdominal distention or weight gain.   Past Medical History:  Diagnosis Date  . Anemia   . Aortic stenosis   . Arrhythmia   . Atrial fibrillation ([place])   . Cardiomyopathy ([place])   . CHF (congestive heart failure) ([place])   . Heartburn    Past Surgical History:  Procedure Laterality Date  . 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 CYSTOSCOPY W/ RETROGRADES Bilateral 08/14/2016   Procedure: CYSTOSCOPY WITH RETROGRADE PYELOGRAM;  Surgeon: Hollice Espy, MD;  Location: [facility];  Service: Urology;  Laterality: Bilateral;  . LEFT HEART CATH AND CORONARY ANGIOGRAPHY Left 09/05/2016   Procedure: Left Heart Cath and Coronary Angiography;  Surgeon: Teodoro Spray, MD;  Location: [place] [facility];  Service: Cardiovascular;  Laterality: Left;  . neck growth removal      1970's  . TONSILLECTOMY    . TRANSURETHRAL RESECTION OF BLADDER TUMOR N/A 08/14/2016   Procedure: TRANSURETHRAL RESECTION OF BLADDER TUMOR (TURBT);  Surgeon: Hollice Espy, MD;  Location: [facility];  Service: Urology;  Laterality: N/A;   Family History  Problem Relation Age of Onset  . Heart failure Father   . Heart failure Brother   . Atrial fibrillation Sister   . Prostate cancer Neg Hx   . Kidney cancer Neg Hx   . Bladder Cancer Neg Hx    Social History   Tobacco Use  . Smoking status: Never Smoker  . Smokeless tobacco: Never Used  Substance Use Topics  . Alcohol use: No   No Known Allergies Prior to Admission medications   Medication Sig Start Date End Date Taking? Authorizing Provider  amiodarone (PACERONE) 200 MG tablet Take 200 mg by mouth daily.    Yes [provider]  ASPIRIN 81 PO Take 1 tablet by mouth daily.    Yes [provider]  Calcium Carbonate-Vitamin D (OYSTER SHELL CALCIUM 500 + D) 500-125 MG-UNIT TABS Take 1 tablet by mouth daily.    Yes [provider]  ferrous sulfate 325 (65 FE) MG tablet Take 325 mg by mouth 2 (two) times daily with a meal.   Yes [provider]  FLUoxetine (PROZAC) 20 MG capsule Take 20 mg by mouth daily.    Yes [provider]  furosemide (LASIX) 40 MG tablet Take 1 tablet (40 mg total) by mouth 2 (two) times daily. 04/19/17  Yes  Dustin Flock, MD  Melatonin 3 MG TABS Take 1 tablet by mouth at bedtime.   Yes [provider]  metoprolol tartrate (LOPRESSOR) 25 MG tablet Take 12.5 mg by mouth 3 (three) times daily.    Yes [provider]  nitroGLYCERIN (NITROSTAT) 0.4 MG SL tablet Place under the tongue. 02/22/17 02/22/18 Yes [provider]  Nutritional Supplements (PROMOD) LIQD Take by mouth.   Yes [provider]  omeprazole (PRILOSEC) 20 MG capsule Take 20 mg by mouth daily before breakfast.    Yes [provider]  potassium chloride 20 MEQ TBCR  Take 20 mEq by mouth daily for 3 days. 04/19/17 05/02/17 Yes Dustin Flock, MD  pravastatin (PRAVACHOL) 40 MG tablet Take 1 tablet by mouth daily. 04/13/17  Yes [provider]  senna-docusate (SENOKOT-S) 8.6-50 MG tablet Take by mouth. 10/11/16 10/11/17 Yes [provider]  warfarin (COUMADIN) 2.5 MG tablet Take 2 tablets (5 mg total) by mouth at bedtime. 04/19/17 04/19/18 Yes Dustin Flock, MD  lidocaine (LIDODERM) 5 % Place 1 patch onto the skin every 12 (twelve) hours. Remove & Discard patch within 12 hours or as directed by MD    [provider]  polyethylene glycol (MIRALAX / GLYCOLAX) packet Take by mouth. 12/08/16   [provider]    Review of Systems  Constitutional: Positive for fatigue. Negative for appetite change.  HENT: Negative for congestion, postnasal drip and sore throat.   Eyes: Negative.   Respiratory: Positive for shortness of breath. Negative for cough and chest tightness.   Cardiovascular: Positive for leg swelling. Negative for chest pain and palpitations.  Gastrointestinal: Negative for abdominal distention and abdominal pain.  Endocrine: Negative.   Genitourinary: Negative.   Musculoskeletal: Negative for back pain and neck pain.  Skin: Negative.   Allergic/Immunologic: Negative.   Neurological: Positive for light-headedness (rarely). Negative for dizziness.  Hematological: Negative for adenopathy. Bruises/bleeds easily.  Psychiatric/Behavioral: Positive for sleep disturbance (wake up frequently due to urination). Negative for dysphoric mood. The patient is not nervous/anxious.    Vitals:   05/02/17 1017  BP: 113/63  Pulse: 69  Resp: 18  SpO2: 96%  Weight: 211 lb 4 oz (95.8 kg)  Height: 6' (1.829 m)   Wt Readings from Last 3 Encounters:  05/02/17 211 lb 4 oz (95.8 kg)  04/19/17 210 lb 6.4 oz (95.4 kg)  04/15/17 216 lb (98 kg)   Lab Results  Component Value Date   CREATININE 1.10 04/19/2017   CREATININE 1.29 (H)  04/18/2017   CREATININE 1.24 04/17/2017   Physical Exam  Constitutional: He is oriented to person, place, and time. He appears well-developed and well-nourished.  HENT:  Head: Normocephalic and atraumatic.  Neck: Normal range of motion. Neck supple. No JVD present.  Cardiovascular: Normal rate and regular rhythm.  Pulmonary/Chest: Effort normal. No respiratory distress. He has no wheezes. He has no rales.  Abdominal: Soft. He exhibits no distension. There is no tenderness.  Musculoskeletal: He exhibits edema (2-3+ pitting edema in bilateral lower legs). He exhibits no tenderness.  Neurological: He is alert and oriented to person, place, and time.  Skin: Skin is warm and dry.  Psychiatric: He has a normal mood and affect. His behavior is normal. Thought content normal.  Nursing note and vitals reviewed.  Assessment & Plan:  1: Chronic heart failure with reduced ejection fraction- - NYHA class III - moderately fluid overloaded today - weighing daily; instructed to call for an overnight weight gain of >2 pounds  overnight or >5 pounds overnight - not adding salt and his wife is reading food labels. Reviewed the importance of closely following a 2000mg  sodium diet and written dietary information was given to him - receiving PT 2 X/week - sees cardiology Kyle Gregory) on 05/21/17 - patient reports receiving flu vaccine for the season - BNP from 04/17/17 was 2081.0 - PharmD went in and reviewed medications with patient  2: Atrial fibrillation- - on amiodarone, aspirin, metoprolol and warfarin  3: Lymphedema- - stage 2 - does elevate his legs but edema persists - instructed to get TED hose and apply them every morning with removal at bedtime - limited in exercise due to shortness of breath - discussed possible compression boots if edema persists - sees PCP Kyle Gregory) 05/19/16 - BMP from 04/19/17 reviewed and showed sodium 131, potassium 3.1 and GFR >60  Medication bottles were  reviewed.  Return in 1 month or sooner for any questions/problems before then.

## 2017-05-02 NOTE — Patient Instructions (Signed)
Continue weighing daily and call for an overnight weight gain of > 2 pounds or a weekly weight gain of >5 pounds. 

## 2017-05-04 ENCOUNTER — Encounter: Payer: Self-pay | Admitting: Family

## 2017-05-04 DIAGNOSIS — I5022 Chronic systolic (congestive) heart failure: Secondary | ICD-10-CM | POA: Insufficient documentation

## 2017-05-04 DIAGNOSIS — I890 Lymphedema, not elsewhere classified: Secondary | ICD-10-CM | POA: Insufficient documentation

## 2017-05-14 NOTE — Telephone Encounter (Signed)
Patient Seen 05/02/2017

## 2017-06-07 NOTE — Progress Notes (Signed)
Patient ID: Kyle Gregory, male    DOB: [birth year], 77 y.o.   MRN: [id number]  HPI  Kyle Gregory is a 77 y/o male with a history of atrial fibrillation, aortic stenosis, anemia and chronic heart failure.   Echo report from 10/12/16 reviewed and showed an EF of 30% along with mild Kyle/TR. Cardiac catheterization done 09/05/16 showed multi-vessel disease with an estimated EF of 25-35%.   Admitted 04/17/17 due to HF exacerbation. Initially treated with IV furosemide and then transitioned to oral diuretics. Discharged after 2 days. Was in the ED 04/15/17 due to HF exacerbation. Given IV lasix and discharged home. Was in the ED 04/13/17 due to supratherapeutic INR but without evidence of hemorrhage. CT done which showed no acute abnormality. Discharged home after medications adjusted.   He presents today for a follow-up visit with a chief complaint of moderate fatigue upon minimal exertion. He describes this as chronic in nature having been present for several years with varying levels of severity. He has associated shortness of breath, edema, light-headedness and difficulty sleeping along with this. He denies any chest pain, cough, palpitations, abdominal distention or weight gain.   Past Medical History:  Diagnosis Date  . Anemia   . Aortic stenosis   . Arrhythmia   . Atrial fibrillation ([place])   . Cardiomyopathy ([place])   . CHF (congestive heart failure) ([place])   . Heartburn    Past Surgical History:  Procedure Laterality Date  . 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 CYSTOSCOPY W/ RETROGRADES Bilateral 08/14/2016   Procedure: CYSTOSCOPY WITH RETROGRADE PYELOGRAM;  Surgeon: Hollice Espy, MD;  Location: [facility];  Service: Urology;  Laterality: Bilateral;  . LEFT HEART CATH AND CORONARY ANGIOGRAPHY Left 09/05/2016   Procedure: Left Heart Cath and Coronary Angiography;  Surgeon: Teodoro Spray, MD;  Location: [place] [facility];  Service: Cardiovascular;   Laterality: Left;  . neck growth removal     1970's  . TONSILLECTOMY    . TRANSURETHRAL RESECTION OF BLADDER TUMOR N/A 08/14/2016   Procedure: TRANSURETHRAL RESECTION OF BLADDER TUMOR (TURBT);  Surgeon: Hollice Espy, MD;  Location: [facility];  Service: Urology;  Laterality: N/A;   Family History  Problem Relation Gregory of Onset  . Heart failure Father   . Heart failure Brother   . Atrial fibrillation Sister   . Prostate cancer Neg Hx   . Kidney cancer Neg Hx   . Bladder Cancer Neg Hx    Social History   Tobacco Use  . Smoking status: Never Smoker  . Smokeless tobacco: Never Used  Substance Use Topics  . Alcohol use: No   No Known Allergies  Prior to Admission medications   Medication Sig Start Date End Date Taking? Authorizing Provider  amiodarone (PACERONE) 200 MG tablet Take 200 mg by mouth daily.    Yes [provider]  ASPIRIN 81 PO Take 1 tablet by mouth daily.    Yes [provider]  Calcium Carbonate-Vitamin D (OYSTER SHELL CALCIUM 500 + D) 500-125 MG-UNIT TABS Take 1 tablet by mouth daily.    Yes [provider]  ferrous sulfate 325 (65 FE) MG tablet Take 325 mg by mouth 2 (two) times daily with a meal.   Yes [provider]  FLUoxetine (PROZAC) 20 MG capsule Take 20 mg by mouth daily.    Yes [provider]  furosemide (LASIX) 40 MG tablet Take 1 tablet (40 mg total)  by mouth 2 (two) times daily. 04/19/17  Yes Kyle Flock, MD  Melatonin 3 MG TABS Take 1 tablet by mouth at bedtime.   Yes [provider]  metoprolol tartrate (LOPRESSOR) 25 MG tablet Take 12.5 mg by mouth 3 (three) times daily.    Yes [provider]  nitroGLYCERIN (NITROSTAT) 0.4 MG SL tablet Place under the tongue. 02/22/17 02/22/18 Yes [provider]  Nutritional Supplements (PROMOD) LIQD Take by mouth.   Yes [provider]  omeprazole (PRILOSEC) 20 MG capsule Take 20 mg by mouth daily before breakfast.    Yes [provider]  polyethylene glycol (MIRALAX / GLYCOLAX) packet Take by mouth. 12/08/16  Yes [provider]  pravastatin (PRAVACHOL) 40 MG tablet Take 1 tablet by mouth daily. 04/13/17  Yes [provider]  senna-docusate (SENOKOT-S) 8.6-50 MG tablet Take by mouth. 10/11/16 10/11/17 Yes [provider]  spironolactone (ALDACTONE) 25 MG tablet Take 1 tablet by mouth daily. 05/21/17 05/21/18 Yes [provider]  warfarin (COUMADIN) 2.5 MG tablet Take 2 tablets (5 mg total) by mouth at bedtime. Patient taking differently: Take 3 mg by mouth at bedtime.  04/19/17 04/19/18 Yes Kyle Flock, MD  lidocaine (LIDODERM) 5 % Place 1 patch onto the skin every 12 (twelve) hours. Remove & Discard patch within 12 hours or as directed by MD    [provider]    Review of Systems  Constitutional: Positive for fatigue. Negative for appetite change.  HENT: Negative for congestion, postnasal drip and sore throat.   Eyes: Negative.   Respiratory: Positive for shortness of breath. Negative for cough and chest tightness.   Cardiovascular: Positive for leg swelling. Negative for chest pain and palpitations.  Gastrointestinal: Negative for abdominal distention and abdominal pain.  Endocrine: Negative.   Genitourinary: Negative.   Musculoskeletal: Negative for back pain and neck pain.  Skin: Negative.   Allergic/Immunologic: Negative.   Neurological: Positive for light-headedness (rarely). Negative for dizziness.  Hematological: Negative for adenopathy. Bruises/bleeds easily.  Psychiatric/Behavioral: Positive for sleep disturbance (wake up frequently due to urination). Negative for dysphoric mood. The patient is not nervous/anxious.    Vitals:   06/08/17 1130  BP: 118/82  Pulse: 77  Resp: 18  SpO2: 100%  Weight: 209 lb 8 oz (95 kg)  Height: 6' (1.829 m)   Wt Readings from Last 3 Encounters:  06/08/17 209 lb 8 oz (95 kg)  05/02/17 211 lb 4 oz (95.8 kg)   04/19/17 210 lb 6.4 oz (95.4 kg)   Lab Results  Component Value Date   CREATININE 1.10 04/19/2017   CREATININE 1.29 (H) 04/18/2017   CREATININE 1.24 04/17/2017    Physical Exam  Constitutional: He is oriented to person, place, and time. He appears well-developed and well-nourished.  HENT:  Head: Normocephalic and atraumatic.  Neck: Normal range of motion. Neck supple. No JVD present.  Cardiovascular: Normal rate and regular rhythm.  Pulmonary/Chest: Effort normal. No respiratory distress. He has no wheezes. He has no rales.  Abdominal: Soft. He exhibits no distension. There is no tenderness.  Musculoskeletal: He exhibits edema (2+ pitting edema in bilateral lower legs). He exhibits no tenderness.  Neurological: He is alert and oriented to person, place, and time.  Skin: Skin is warm and dry.  Psychiatric: He has a normal mood and affect. His behavior is normal. Thought content normal.  Nursing note and vitals reviewed.  Assessment & Plan:  1: Chronic heart failure with reduced ejection fraction- - NYHA class  III - fluid status stable today - weighing daily; instructed to call for an overnight weight gain of >2 pounds overnight or >5 pounds overnight - not adding salt and his wife is reading food labels. Reviewed the importance of closely following a 2000mg  sodium diet and written dietary information was given to him - receiving PT 2 X/week - saw cardiology Kyle Gregory) on 05/21/17 - patient reports receiving flu vaccine for the season - BNP from 04/17/17 was 2081.0 - drinking ~40 ounces of fluid daily  2: Atrial fibrillation- - on amiodarone, aspirin, metoprolol and warfarin  3: Lymphedema- - stage 2 - does elevate his legs but edema persists - tried wearing compression socks but they made his legs hurt - wife has been wrapping legs which has helped with edema - limited in exercise due to shortness of breath - will place referral for compression boots - saw PCP Kyle Gregory)  05/31/17 & returns 06/11/17 - BMP from 05/10/17 reviewed and showed sodium 138, potassium 4.1 and GFR 65  Medication bottles were reviewed.  Return in 3 months or sooner for any questions/problems before then.

## 2017-06-08 ENCOUNTER — Ambulatory Visit: Payer: Medicare Other | Attending: Family | Admitting: Family

## 2017-06-08 ENCOUNTER — Encounter: Payer: Self-pay | Admitting: Family

## 2017-06-08 VITALS — BP 118/82 | HR 77 | Resp 18 | Ht 72.0 in | Wt 209.5 lb

## 2017-06-08 DIAGNOSIS — Z7982 Long term (current) use of aspirin: Secondary | ICD-10-CM | POA: Diagnosis not present

## 2017-06-08 DIAGNOSIS — Z7901 Long term (current) use of anticoagulants: Secondary | ICD-10-CM | POA: Insufficient documentation

## 2017-06-08 DIAGNOSIS — Z951 Presence of aortocoronary bypass graft: Secondary | ICD-10-CM | POA: Insufficient documentation

## 2017-06-08 DIAGNOSIS — Z952 Presence of prosthetic heart valve: Secondary | ICD-10-CM | POA: Insufficient documentation

## 2017-06-08 DIAGNOSIS — R0602 Shortness of breath: Secondary | ICD-10-CM | POA: Diagnosis not present

## 2017-06-08 DIAGNOSIS — I890 Lymphedema, not elsewhere classified: Secondary | ICD-10-CM | POA: Insufficient documentation

## 2017-06-08 DIAGNOSIS — Z8249 Family history of ischemic heart disease and other diseases of the circulatory system: Secondary | ICD-10-CM | POA: Insufficient documentation

## 2017-06-08 DIAGNOSIS — D649 Anemia, unspecified: Secondary | ICD-10-CM | POA: Insufficient documentation

## 2017-06-08 DIAGNOSIS — I429 Cardiomyopathy, unspecified: Secondary | ICD-10-CM | POA: Insufficient documentation

## 2017-06-08 DIAGNOSIS — I5022 Chronic systolic (congestive) heart failure: Secondary | ICD-10-CM | POA: Diagnosis present

## 2017-06-08 DIAGNOSIS — Z9889 Other specified postprocedural states: Secondary | ICD-10-CM | POA: Diagnosis not present

## 2017-06-08 DIAGNOSIS — I350 Nonrheumatic aortic (valve) stenosis: Secondary | ICD-10-CM | POA: Diagnosis not present

## 2017-06-08 DIAGNOSIS — I4891 Unspecified atrial fibrillation: Secondary | ICD-10-CM | POA: Diagnosis not present

## 2017-06-08 DIAGNOSIS — Z79899 Other long term (current) drug therapy: Secondary | ICD-10-CM | POA: Insufficient documentation

## 2017-06-08 DIAGNOSIS — I482 Chronic atrial fibrillation, unspecified: Secondary | ICD-10-CM

## 2017-06-08 NOTE — Patient Instructions (Addendum)
Continue weighing daily and call for an overnight weight gain of > 2 pounds or a weekly weight gain of >5 pounds. 

## 2017-06-26 DIAGNOSIS — Z0000 Encounter for general adult medical examination without abnormal findings: Secondary | ICD-10-CM | POA: Insufficient documentation

## 2017-06-29 ENCOUNTER — Other Ambulatory Visit: Payer: Self-pay | Admitting: Family

## 2017-09-03 ENCOUNTER — Observation Stay
Admission: EM | Admit: 2017-09-03 | Discharge: 2017-09-04 | Disposition: A | Discharge status: Home / Self Care | Payer: Medicare Other | Attending: Internal Medicine | Admitting: Internal Medicine

## 2017-09-03 ENCOUNTER — Encounter: Payer: Self-pay | Admitting: Emergency Medicine

## 2017-09-03 ENCOUNTER — Other Ambulatory Visit: Payer: Self-pay

## 2017-09-03 ENCOUNTER — Emergency Department: Payer: Medicare Other

## 2017-09-03 DIAGNOSIS — I4892 Unspecified atrial flutter: Secondary | ICD-10-CM | POA: Insufficient documentation

## 2017-09-03 DIAGNOSIS — I499 Cardiac arrhythmia, unspecified: Secondary | ICD-10-CM | POA: Insufficient documentation

## 2017-09-03 DIAGNOSIS — Z7901 Long term (current) use of anticoagulants: Secondary | ICD-10-CM | POA: Insufficient documentation

## 2017-09-03 DIAGNOSIS — R748 Abnormal levels of other serum enzymes: Secondary | ICD-10-CM | POA: Insufficient documentation

## 2017-09-03 DIAGNOSIS — R55 Syncope and collapse: Secondary | ICD-10-CM | POA: Diagnosis not present

## 2017-09-03 DIAGNOSIS — R001 Bradycardia, unspecified: Secondary | ICD-10-CM

## 2017-09-03 DIAGNOSIS — I420 Dilated cardiomyopathy: Secondary | ICD-10-CM | POA: Diagnosis not present

## 2017-09-03 DIAGNOSIS — Z9889 Other specified postprocedural states: Secondary | ICD-10-CM | POA: Insufficient documentation

## 2017-09-03 DIAGNOSIS — I482 Chronic atrial fibrillation: Secondary | ICD-10-CM | POA: Diagnosis not present

## 2017-09-03 DIAGNOSIS — M7989 Other specified soft tissue disorders: Secondary | ICD-10-CM

## 2017-09-03 DIAGNOSIS — I480 Paroxysmal atrial fibrillation: Secondary | ICD-10-CM | POA: Insufficient documentation

## 2017-09-03 DIAGNOSIS — L8990 Pressure ulcer of unspecified site, unspecified stage: Secondary | ICD-10-CM

## 2017-09-03 DIAGNOSIS — D631 Anemia in chronic kidney disease: Secondary | ICD-10-CM | POA: Diagnosis not present

## 2017-09-03 DIAGNOSIS — I5022 Chronic systolic (congestive) heart failure: Secondary | ICD-10-CM | POA: Diagnosis not present

## 2017-09-03 DIAGNOSIS — L89899 Pressure ulcer of other site, unspecified stage: Secondary | ICD-10-CM | POA: Insufficient documentation

## 2017-09-03 DIAGNOSIS — E538 Deficiency of other specified B group vitamins: Secondary | ICD-10-CM | POA: Diagnosis not present

## 2017-09-03 DIAGNOSIS — Z7982 Long term (current) use of aspirin: Secondary | ICD-10-CM | POA: Insufficient documentation

## 2017-09-03 DIAGNOSIS — Z951 Presence of aortocoronary bypass graft: Secondary | ICD-10-CM | POA: Insufficient documentation

## 2017-09-03 DIAGNOSIS — I447 Left bundle-branch block, unspecified: Secondary | ICD-10-CM | POA: Diagnosis not present

## 2017-09-03 DIAGNOSIS — T441X5A Adverse effect of other parasympathomimetics [cholinergics], initial encounter: Secondary | ICD-10-CM | POA: Diagnosis not present

## 2017-09-03 DIAGNOSIS — R12 Heartburn: Secondary | ICD-10-CM | POA: Diagnosis not present

## 2017-09-03 DIAGNOSIS — Z79899 Other long term (current) drug therapy: Secondary | ICD-10-CM | POA: Insufficient documentation

## 2017-09-03 DIAGNOSIS — N183 Chronic kidney disease, stage 3 (moderate): Secondary | ICD-10-CM | POA: Insufficient documentation

## 2017-09-03 DIAGNOSIS — Z8673 Personal history of transient ischemic attack (TIA), and cerebral infarction without residual deficits: Secondary | ICD-10-CM | POA: Insufficient documentation

## 2017-09-03 DIAGNOSIS — M19012 Primary osteoarthritis, left shoulder: Secondary | ICD-10-CM | POA: Insufficient documentation

## 2017-09-03 DIAGNOSIS — K219 Gastro-esophageal reflux disease without esophagitis: Secondary | ICD-10-CM | POA: Insufficient documentation

## 2017-09-03 DIAGNOSIS — I255 Ischemic cardiomyopathy: Secondary | ICD-10-CM | POA: Diagnosis not present

## 2017-09-03 DIAGNOSIS — I959 Hypotension, unspecified: Secondary | ICD-10-CM

## 2017-09-03 DIAGNOSIS — J449 Chronic obstructive pulmonary disease, unspecified: Secondary | ICD-10-CM | POA: Insufficient documentation

## 2017-09-03 DIAGNOSIS — R079 Chest pain, unspecified: Secondary | ICD-10-CM

## 2017-09-03 DIAGNOSIS — I252 Old myocardial infarction: Secondary | ICD-10-CM | POA: Insufficient documentation

## 2017-09-03 DIAGNOSIS — I350 Nonrheumatic aortic (valve) stenosis: Secondary | ICD-10-CM | POA: Diagnosis not present

## 2017-09-03 DIAGNOSIS — I130 Hypertensive heart and chronic kidney disease with heart failure and stage 1 through stage 4 chronic kidney disease, or unspecified chronic kidney disease: Secondary | ICD-10-CM | POA: Diagnosis not present

## 2017-09-03 DIAGNOSIS — Z6825 Body mass index (BMI) 25.0-25.9, adult: Secondary | ICD-10-CM | POA: Insufficient documentation

## 2017-09-03 DIAGNOSIS — I700 Atherosclerosis of aorta: Secondary | ICD-10-CM | POA: Insufficient documentation

## 2017-09-03 DIAGNOSIS — F0390 Unspecified dementia without behavioral disturbance: Secondary | ICD-10-CM | POA: Insufficient documentation

## 2017-09-03 DIAGNOSIS — Z952 Presence of prosthetic heart valve: Secondary | ICD-10-CM | POA: Insufficient documentation

## 2017-09-03 DIAGNOSIS — R778 Other specified abnormalities of plasma proteins: Secondary | ICD-10-CM

## 2017-09-03 DIAGNOSIS — M19011 Primary osteoarthritis, right shoulder: Secondary | ICD-10-CM | POA: Insufficient documentation

## 2017-09-03 DIAGNOSIS — I2510 Atherosclerotic heart disease of native coronary artery without angina pectoris: Secondary | ICD-10-CM | POA: Insufficient documentation

## 2017-09-03 DIAGNOSIS — Z8249 Family history of ischemic heart disease and other diseases of the circulatory system: Secondary | ICD-10-CM | POA: Insufficient documentation

## 2017-09-03 DIAGNOSIS — E785 Hyperlipidemia, unspecified: Secondary | ICD-10-CM | POA: Insufficient documentation

## 2017-09-03 DIAGNOSIS — R0989 Other specified symptoms and signs involving the circulatory and respiratory systems: Secondary | ICD-10-CM

## 2017-09-03 DIAGNOSIS — R7989 Other specified abnormal findings of blood chemistry: Secondary | ICD-10-CM

## 2017-09-03 HISTORY — DX: Chronic obstructive pulmonary disease, unspecified: J44.9

## 2017-09-03 LAB — CBC WITH DIFFERENTIAL/PLATELET
BASOS ABS: 0.1 10*3/uL (ref 0–0.1)
BASOS PCT: 1 %
EOS ABS: 0.1 10*3/uL (ref 0–0.7)
EOS PCT: 3 %
HCT: 42.9 % (ref 40.0–52.0)
Hemoglobin: 14.1 g/dL (ref 13.0–18.0)
Lymphocytes Relative: 15 %
Lymphs Abs: 0.7 10*3/uL — ABNORMAL LOW (ref 1.0–3.6)
MCH: 31.4 pg (ref 26.0–34.0)
MCHC: 32.9 g/dL (ref 32.0–36.0)
MCV: 95.5 fL (ref 80.0–100.0)
MONO ABS: 0.5 10*3/uL (ref 0.2–1.0)
Monocytes Relative: 11 %
Neutro Abs: 3.1 10*3/uL (ref 1.4–6.5)
Neutrophils Relative %: 70 %
PLATELETS: 134 10*3/uL — AB (ref 150–440)
RBC: 4.49 MIL/uL (ref 4.40–5.90)
RDW: 15.3 % — AB (ref 11.5–14.5)
WBC: 4.5 10*3/uL (ref 3.8–10.6)

## 2017-09-03 LAB — COMPREHENSIVE METABOLIC PANEL
ALBUMIN: 3.5 g/dL (ref 3.5–5.0)
ALT: 44 U/L (ref 17–63)
AST: 52 U/L — AB (ref 15–41)
Alkaline Phosphatase: 204 U/L — ABNORMAL HIGH (ref 38–126)
Anion gap: 10 (ref 5–15)
BUN: 26 mg/dL — AB (ref 6–20)
CHLORIDE: 99 mmol/L — AB (ref 101–111)
CO2: 25 mmol/L (ref 22–32)
Calcium: 8.7 mg/dL — ABNORMAL LOW (ref 8.9–10.3)
Creatinine, Ser: 1.29 mg/dL — ABNORMAL HIGH (ref 0.61–1.24)
GFR calc Af Amer: >60 mL/min (ref >60)
GFR, EST NON AFRICAN AMERICAN: 52 mL/min — AB (ref >60)
Glucose, Bld: 129 mg/dL — ABNORMAL HIGH (ref 65–99)
Potassium: 4.3 mmol/L (ref 3.5–5.1)
SODIUM: 134 mmol/L — AB (ref 135–145)
Total Bilirubin: 2.3 mg/dL — ABNORMAL HIGH (ref 0.3–1.2)
Total Protein: 7.8 g/dL (ref 6.5–8.1)

## 2017-09-03 LAB — TSH: TSH: 2.652 u[IU]/mL (ref 0.350–4.500)

## 2017-09-03 LAB — TROPONIN I
TROPONIN I: 0.04 ng/mL — AB (ref <0.03)
Troponin I: 0.04 ng/mL (ref <0.03)
Troponin I: 0.05 ng/mL (ref <0.03)
Troponin I: 0.05 ng/mL (ref <0.03)

## 2017-09-03 LAB — PROTIME-INR
INR: 1.62
Prothrombin Time: 19.1 seconds — ABNORMAL HIGH (ref 11.4–15.2)

## 2017-09-03 LAB — GLUCOSE, CAPILLARY: Glucose-Capillary: 110 mg/dL — ABNORMAL HIGH (ref 65–99)

## 2017-09-03 MED ORDER — FERROUS SULFATE 325 (65 FE) MG PO TABS
325.0000 mg | ORAL_TABLET | Freq: Two times a day (BID) | ORAL | Status: DC
  Administered 2017-09-03 – 2017-09-04 (×3): 325 mg via ORAL
  Filled 2017-09-03 (×3): qty 1

## 2017-09-03 MED ORDER — WARFARIN SODIUM 6 MG PO TABS
6.0000 mg | ORAL_TABLET | Freq: Every day | ORAL | Status: DC
  Administered 2017-09-03: 6 mg via ORAL
  Filled 2017-09-03: qty 1

## 2017-09-03 MED ORDER — NITROGLYCERIN 0.3 MG SL SUBL: 0.3000 mg | SUBLINGUAL_TABLET | SUBLINGUAL | Status: DC | PRN

## 2017-09-03 MED ORDER — SODIUM CHLORIDE 0.9 % IV SOLN
INTRAVENOUS | Status: DC
  Administered 2017-09-03: 08:00:00 via INTRAVENOUS

## 2017-09-03 MED ORDER — SENNOSIDES-DOCUSATE SODIUM 8.6-50 MG PO TABS: 2.0000 | ORAL_TABLET | Freq: Two times a day (BID) | ORAL | Status: DC | PRN

## 2017-09-03 MED ORDER — PANTOPRAZOLE SODIUM 40 MG PO TBEC
40.0000 mg | DELAYED_RELEASE_TABLET | Freq: Every day | ORAL | Status: DC
  Administered 2017-09-03 – 2017-09-04 (×2): 40 mg via ORAL
  Filled 2017-09-03 (×2): qty 1

## 2017-09-03 MED ORDER — DONEPEZIL HCL 5 MG PO TABS: 5.0000 mg | ORAL_TABLET | Freq: Every day | ORAL | Status: DC

## 2017-09-03 MED ORDER — PRAVASTATIN SODIUM 40 MG PO TABS
40.0000 mg | ORAL_TABLET | Freq: Every day | ORAL | Status: DC
  Administered 2017-09-03: 40 mg via ORAL
  Filled 2017-09-03: qty 1

## 2017-09-03 MED ORDER — MELATONIN 5 MG PO TABS
2.5000 mg | ORAL_TABLET | Freq: Every day | ORAL | Status: DC
  Administered 2017-09-03: 2.5 mg via ORAL
  Filled 2017-09-03 (×2): qty 0.5

## 2017-09-03 MED ORDER — METOPROLOL TARTRATE 25 MG PO TABS
12.5000 mg | ORAL_TABLET | Freq: Two times a day (BID) | ORAL | Status: DC
  Administered 2017-09-03 – 2017-09-04 (×2): 12.5 mg via ORAL
  Filled 2017-09-03 (×3): qty 1

## 2017-09-03 MED ORDER — ONDANSETRON HCL 4 MG/2ML IJ SOLN: 4.0000 mg | Freq: Four times a day (QID) | INTRAMUSCULAR | Status: DC | PRN

## 2017-09-03 MED ORDER — NITROGLYCERIN 0.4 MG SL SUBL: 0.4000 mg | SUBLINGUAL_TABLET | SUBLINGUAL | Status: DC | PRN

## 2017-09-03 MED ORDER — FUROSEMIDE 40 MG PO TABS
40.0000 mg | ORAL_TABLET | Freq: Two times a day (BID) | ORAL | Status: DC
  Administered 2017-09-03 – 2017-09-04 (×3): 40 mg via ORAL
  Filled 2017-09-03 (×3): qty 1

## 2017-09-03 MED ORDER — HEPARIN SODIUM (PORCINE) 5000 UNIT/ML IJ SOLN
5000.0000 [IU] | Freq: Three times a day (TID) | INTRAMUSCULAR | Status: DC
  Administered 2017-09-03 – 2017-09-04 (×4): 5000 [IU] via SUBCUTANEOUS
  Filled 2017-09-03 (×4): qty 1

## 2017-09-03 MED ORDER — WARFARIN SODIUM 4 MG PO TABS
4.0000 mg | ORAL_TABLET | Freq: Every day | ORAL | Status: DC
  Filled 2017-09-03: qty 1

## 2017-09-03 MED ORDER — CALCIUM CARBONATE-VITAMIN D 500-200 MG-UNIT PO TABS
1.0000 | ORAL_TABLET | Freq: Every day | ORAL | Status: DC
  Administered 2017-09-03 – 2017-09-04 (×2): 1 via ORAL
  Filled 2017-09-03 (×2): qty 1

## 2017-09-03 MED ORDER — ASPIRIN EC 81 MG PO TBEC
81.0000 mg | DELAYED_RELEASE_TABLET | Freq: Every day | ORAL | Status: DC
  Administered 2017-09-03 – 2017-09-04 (×2): 81 mg via ORAL
  Filled 2017-09-03 (×2): qty 1

## 2017-09-03 MED ORDER — SPIRONOLACTONE 25 MG PO TABS
25.0000 mg | ORAL_TABLET | Freq: Every day | ORAL | Status: DC
  Administered 2017-09-03 – 2017-09-04 (×2): 25 mg via ORAL
  Filled 2017-09-03 (×2): qty 1

## 2017-09-03 MED ORDER — ACETAMINOPHEN 650 MG RE SUPP: 650.0000 mg | Freq: Four times a day (QID) | RECTAL | Status: DC | PRN

## 2017-09-03 MED ORDER — FLUOXETINE HCL 20 MG PO CAPS
20.0000 mg | ORAL_CAPSULE | Freq: Every day | ORAL | Status: DC
  Administered 2017-09-03 – 2017-09-04 (×2): 20 mg via ORAL
  Filled 2017-09-03 (×2): qty 1

## 2017-09-03 MED ORDER — AMIODARONE HCL 200 MG PO TABS
200.0000 mg | ORAL_TABLET | Freq: Every day | ORAL | Status: DC
  Administered 2017-09-03 – 2017-09-04 (×2): 200 mg via ORAL
  Filled 2017-09-03 (×2): qty 1

## 2017-09-03 MED ORDER — ZINC OXIDE 40 % EX OINT
1.0000 | TOPICAL_OINTMENT | CUTANEOUS | Status: DC | PRN
Start: 2017-09-03 — End: 2017-09-04
  Filled 2017-09-03: qty 113

## 2017-09-03 MED ORDER — MELATONIN 3 MG PO TABS: 1.0000 | ORAL_TABLET | Freq: Every day | ORAL | Status: DC

## 2017-09-03 MED ORDER — WARFARIN - PHARMACIST DOSING INPATIENT
Freq: Every day | Status: DC
  Administered 2017-09-03: 17:00:00

## 2017-09-03 MED ORDER — ACETAMINOPHEN 325 MG PO TABS: 650.0000 mg | ORAL_TABLET | Freq: Four times a day (QID) | ORAL | Status: DC | PRN

## 2017-09-03 MED ORDER — ONDANSETRON HCL 4 MG PO TABS: 4.0000 mg | ORAL_TABLET | Freq: Four times a day (QID) | ORAL | Status: DC | PRN

## 2017-09-03 NOTE — H&P (Signed)
Kyle Gregory is an 77 y.o. male.    Chief Complaint: Syncope HPI: The patient with past medical history of atrial fibrillation, CHF, COPD and CAD status post CABG presents to the emergency department after an episode of syncope.  Tonight's events began with chest pain as the patient awoke with tightness in his upper chest and throat.  His wife checked his blood pressure and pulse ox but could not get a reading on either.  She laid the patient down on the floor and gave him a nitroglycerin.  He attempted to stand with his walker and walked to the bathroom but collapsed in a semi-conscious state.  EMS arrived and found the patient's systolic blood pressure to be in the 60s and his heart rate in the 20s.  Upon arrival to the emergency department his heart rate had improved and the patient was more alert.  Due to intermittent bradycardia and symptomatic syncope the emergency department staff called the hospitalist service for admission.  Past Medical History:  Diagnosis Date  . Anemia   . Aortic stenosis   . Arrhythmia   . Atrial fibrillation ([place])   . Cardiomyopathy ([place])   . CHF (congestive heart failure) ([place])   . COPD (chronic obstructive pulmonary disease) ([place])   . Heartburn     Past Surgical History:  Procedure Laterality Date  . 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 CYSTOSCOPY W/ RETROGRADES Bilateral 08/14/2016   Procedure: CYSTOSCOPY WITH RETROGRADE PYELOGRAM;  Surgeon: Hollice Espy, MD;  Location: [facility] OR[facility];  Service: Urology;  Laterality: Bilateral;  . LEFT HEART CATH AND CORONARY ANGIOGRAPHY Left 09/05/2016   Procedure: Left Heart Cath and Coronary Angiography;  Surgeon: Teodoro Spray, MD;  Location: [facility];  Service: Cardiovascular;  Laterality: Left;  . neck growth removal     1970's  . TONSILLECTOMY    . TRANSURETHRAL RESECTION OF BLADDER TUMOR N/A 08/14/2016   Procedure: TRANSURETHRAL RESECTION OF BLADDER TUMOR (TURBT);   Surgeon: Hollice Espy, MD;  Location: [facility] ORS;  Service: Urology;  Laterality: N/A;    Family History  Problem Relation Age of Onset  . Heart failure Father   . Heart failure Brother   . Atrial fibrillation Sister   . Prostate cancer Neg Hx   . Kidney cancer Neg Hx   . Bladder Cancer Neg Hx    Social History:  reports that he has never smoked. He has never used smokeless tobacco. He reports that he does not drink alcohol or use drugs.  Allergies: No Known Allergies  Prior to Admission medications   Medication Sig Start Date End Date Taking? Authorizing Provider  amiodarone (PACERONE) 200 MG tablet Take 200 mg by mouth daily.    Yes [provider]  aspirin (ASPIRIN 81) 81 MG EC tablet Take 1 tablet by mouth daily.    Yes [provider]  Calcium Carbonate-Vitamin D (OYSTER SHELL CALCIUM 500 + D) 500-125 MG-UNIT TABS Take 1 tablet by mouth daily.    Yes [provider]  donepezil (ARICEPT) 5 MG tablet Take 5 mg by mouth at bedtime.   Yes [provider]  ferrous sulfate 325 (65 FE) MG tablet Take 325 mg by mouth 2 (two) times daily with a meal.   Yes [provider]  FLUoxetine (PROZAC) 20 MG capsule Take 20 mg by mouth daily.    Yes [provider]  furosemide (LASIX) 40 MG tablet Take 1 tablet (  40 mg total) by mouth 2 (two) times daily. 04/19/17  Yes Dustin Flock, MD  lidocaine (LIDODERM) 5 % Place 1 patch onto the skin daily as needed (pain). Remove & Discard patch within 12 hours or as directed by MD    Yes [provider]  liver oil-zinc oxide (DESITIN) 40 % ointment Apply 1 application topically as needed for irritation.   Yes [provider]  Melatonin 3 MG TABS Take 1 tablet by mouth at bedtime.   Yes [provider]  metoprolol tartrate (LOPRESSOR) 25 MG tablet Take 12.5 mg by mouth 2 (two) times daily.    Yes [provider]  nitroGLYCERIN (NITROSTAT) 0.4 MG SL tablet Place under the  tongue. 02/22/17 02/22/18 Yes [provider]  omeprazole (PRILOSEC) 20 MG capsule Take 20 mg by mouth daily before breakfast.    Yes [provider]  pravastatin (PRAVACHOL) 40 MG tablet Take 1 tablet by mouth at bedtime.  04/13/17  Yes [provider]  senna-docusate (SENOKOT-S) 8.6-50 MG tablet Take 2 tablets by mouth 2 (two) times daily as needed for mild constipation.  10/11/16 10/11/17 Yes [provider]  spironolactone (ALDACTONE) 25 MG tablet Take 1 tablet by mouth daily. 05/21/17 05/21/18 Yes [provider]  warfarin (COUMADIN) 2 MG tablet Take 4 mg by mouth at bedtime.   Yes [provider]     Results for orders placed or performed during the hospital encounter of 09/03/17 (from the past 48 hour(s))  Glucose, capillary     Status: Abnormal   Collection Time: 09/03/17  1:21 AM  Result Value Ref Range   Glucose-Capillary 110 (H) 65 - 99 mg/dL  CBC with Differential     Status: Abnormal   Collection Time: 09/03/17  1:28 AM  Result Value Ref Range   WBC 4.5 3.8 - 10.6 K/uL   RBC 4.49 4.40 - 5.90 MIL/uL   Hemoglobin 14.1 13.0 - 18.0 g/dL   HCT 42.9 40.0 - 52.0 %   MCV 95.5 80.0 - 100.0 fL   MCH 31.4 26.0 - 34.0 pg   MCHC 32.9 32.0 - 36.0 g/dL   RDW 15.3 (H) 11.5 - 14.5 %   Platelets 134 (L) 150 - 440 K/uL   Neutrophils Relative % 70 %   Neutro Abs 3.1 1.4 - 6.5 K/uL   Lymphocytes Relative 15 %   Lymphs Abs 0.7 (L) 1.0 - 3.6 K/uL   Monocytes Relative 11 %   Monocytes Absolute 0.5 0.2 - 1.0 K/uL   Eosinophils Relative 3 %   Eosinophils Absolute 0.1 0 - 0.7 K/uL   Basophils Relative 1 %   Basophils Absolute 0.1 0 - 0.1 K/uL    Comment: Performed at [facility], [address] [place], [place] [address]  Comprehensive metabolic panel     Status: Abnormal   Collection Time: 09/03/17  1:28 AM  Result Value Ref Range   Sodium 134 (L) 135 - 145 mmol/L   Potassium 4.3 3.5 - 5.1 mmol/L   Chloride 99 (L) 101 - 111  mmol/L   CO2 25 22 - 32 mmol/L   Glucose, Bld 129 (H) 65 - 99 mg/dL   BUN 26 (H) 6 - 20 mg/dL   Creatinine, Ser 1.29 (H) 0.61 - 1.24 mg/dL   Calcium 8.7 (L) 8.9 - 10.3 mg/dL   Total Protein 7.8 6.5 - 8.1 g/dL   Albumin 3.5 3.5 - 5.0 g/dL   AST 52 (H) 15 - 41 U/L  ALT 44 17 - 63 U/L   Alkaline Phosphatase 204 (H) 38 - 126 U/L   Total Bilirubin 2.3 (H) 0.3 - 1.2 mg/dL   GFR calc non Af Amer 52 (L) >60 mL/min   GFR calc Af Amer >60 >60 mL/min    Comment: (NOTE) The eGFR has been calculated using the CKD EPI equation. This calculation has not been validated in all clinical situations. eGFR's persistently <60 mL/min signify possible Chronic Kidney Disease.    Anion gap 10 5 - 15    Comment: Performed at [facility], [address] [place], [place] [address]  Troponin I     Status: Abnormal   Collection Time: 09/03/17  1:28 AM  Result Value Ref Range   Troponin I 0.04 (HH) <0.03 ng/mL    Comment: CRITICAL RESULT CALLED TO, READ BACK BY AND VERIFIED WITH [place] WALKER AT 0206 09/03/17.PMH Performed at [facility], [address] [place], [place] [address]    Dg Chest Port 1 View  Result Date: 09/03/2017 CLINICAL DATA:  Chest pain after feeling the urge to void. EXAM: PORTABLE CHEST 1 VIEW COMPARISON:  04/17/2017 FINDINGS: Stable cardiomegaly. Aortic atherosclerosis without aneurysm. Low lung volumes with central vascular congestion. Post CABG change of the heart with left atrial appendage clip and aortic valvular replacement noted. No pulmonary consolidation or pneumothorax. There is left basilar atelectasis. Cardiac monitoring leads project over the thorax as well as external defibrillator over the left heart. No acute osseous abnormality. 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 IMPRESSION: Low lung volumes with stable cardiomegaly and aortic atherosclerosis. Post CABG change with mild central vascular congestion. Electronically Signed   By: Ashley Royalty M.D.   On: 09/03/2017 02:13    Review of Systems  Constitutional: Negative for chills and fever.  HENT: Negative for sore throat and tinnitus.   Eyes: Negative for blurred vision and redness.  Respiratory: Negative for cough and shortness of breath.   Cardiovascular: Positive for chest pain. Negative for palpitations, orthopnea and PND.  Gastrointestinal: Negative for abdominal pain, diarrhea, nausea and vomiting.  Genitourinary: Negative for dysuria, frequency and urgency.  Musculoskeletal: Negative for joint pain and myalgias.  Skin: Negative for rash.       No lesions  Neurological: Negative for speech change, focal weakness and weakness.  Endo/Heme/Allergies: Does not bruise/bleed easily.       No temperature intolerance  Psychiatric/Behavioral: Negative for depression and suicidal ideas.    Blood pressure 110/72, pulse 65, temperature (!) 97.4 F (36.3 C), temperature source Oral, resp. rate 12, weight 104.3 kg (230 lb), SpO2 97 %. Physical Exam  Vitals reviewed. Constitutional: He is oriented to person, place, and time. He appears well-developed and well-nourished. No distress.  HENT:  Head: Normocephalic and atraumatic.  Mouth/Throat: Oropharynx is clear and moist.  Eyes: Pupils are equal, round, and reactive to light. Conjunctivae and EOM are normal. No scleral icterus.  Neck: Normal range of motion. Neck supple. No JVD present. No tracheal deviation present. No thyromegaly present.  Cardiovascular: Normal rate, regular rhythm and normal heart sounds. Exam reveals no gallop and no friction rub.  No murmur heard. Respiratory: Effort normal and breath sounds normal. No respiratory distress.  GI: Soft. Bowel sounds are normal. He exhibits no distension. There is no tenderness.  Genitourinary:  Genitourinary Comments: Deferred  Musculoskeletal: Normal range of motion. He exhibits no edema.  Lymphadenopathy:    He has no cervical adenopathy.  Neurological: He is alert  and oriented to person, place, and time. No cranial nerve deficit.  Skin: Skin is warm and dry. No rash noted. No erythema.  Psychiatric: He has a normal mood and affect. His behavior is normal. Judgment and thought content normal.     Assessment/Plan This is a 77 year old male admitted for syncope. 1.  Syncope: Secondary to hypotension following nitroglycerin use with concomitant bradycardia.  Continue to monitor telemetry.  Heart rate has improved.  Consult cardiology. 2.  Atrial fibrillation: Rate controlled; continue metoprolol and amiodarone.  Doses may potentially be too high as the patient has had symptomatic bradycardia.  Await cardiology input.  Continue warfarin 3.  CAD: Stable; opponent is mildly elevated as it has been in the past.  No ischemia on EKG.  Continue aspirin 4.  CHF: Systolic; chronic.  EF 25 to 30% by visual estimate on heart cath last year.  Continue Lasix and Spironolactone. 5.  Hyperlipidemia: Continue statin therapy 6.  Dementia: Continue Aricept 7.  DVT prophylaxis: Therapeutic anticoagulation 8.  GI prophylaxis: PPI per home regimen The patient is a full code.  Time spent on admission on exam patient care approximately 45 minutes  Harrie Foreman, MD 09/03/2017, 5:18 AM

## 2017-09-03 NOTE — Progress Notes (Signed)
Patient with CAD status post CABG and aortic valve replacement surgery about 6 months ago, COPD not on home oxygen, ischemic cardiomyopathy, atrial fibrillation on anticoagulation comes to hospital secondary to chest tightness and gait instability. Appreciate cardiology consult.  For more details, check the history and physical done by Dr. Marcille Blanco this morning -Concern for Aricept causing these findings due to being just started 2 days ago.  Hold Aricept -Physical therapy consult.  Discontinue IV fluids -No bradycardia now, monitor on telemetry.  Patient on oral metoprolol.

## 2017-09-03 NOTE — ED Notes (Signed)
Patient room changed to 2A-252 per Lattie Haw, RN.  Alyssa, EDT to tape patient to floor.

## 2017-09-03 NOTE — Consult Note (Signed)
[facility] Consultation Note  Patient ID: Kyle Gregory, MRN: [id number], DOB/AGE: [birth year] 77 y.o. Admit date: 09/03/2017   Date of Consult: 09/03/2017 Primary Physician: Rusty Aus, MD Primary Cardiologist: [name]  Chief Complaint:  Chief Complaint  Patient presents with  . Bradycardia   Reason for Consult: Syncope  HPI: 77 y.o. male with known paroxysmal nonvalvular atrial fibrillation with aortic stenosis and coronary artery disease with cardiomyopathy status post recent surgical intervention with coronary artery bypass surgery and aortic valve replacement for which the patient had complication with an acute stroke as well as some congestive heart failure.  Patient was placed on appropriate medication management for multiple issues including atrial fibrillation including warfarin as well as amiodarone which the patient does have heart rate control although appears to be in atrial flutter with controlled ventricular rate at this time.  Additionally the patient was placed on metoprolol for good heart rate control.  He has had cardiomyopathy with heart failure with lower extremity edema and LV systolic dysfunction with ejection fraction of 30% for which she has had some improvements of lower extremity edema pulmonary edema with Lasix twice per day and recently switch to once per day.  The patient then has had some memory issues since his bypass surgery and was placed on Aricept 2 days prior both days he has become unsteady and weak and 2 hours after taking Aricept last evening had some weakness and could not stand up.  He had syncope presyncope type symptoms and therefore had a low blood pressure and feeling weak.  The patient was given nitroglycerin because he also had some symptoms of chest discomfort but this did not help.  The patient has a troponin of 0.04 and currently blood pressure is more reasonable.  Patient has an EKG showing with appears to be atrial flutter with  controlled ventricular rate with left bundle branch block with chronic kidney disease stage III a GFR of 52.  He does feel 127% better than he did with admission and feels comfortable at this time does not appear to be any primary cardiac origin of this incident at this time and is suspicious for the Aricept use  Past Medical History:  Diagnosis Date  . Anemia   . Aortic stenosis   . Arrhythmia   . Atrial fibrillation ([place])   . Cardiomyopathy ([place])   . CHF (congestive heart failure) ([place])   . COPD (chronic obstructive pulmonary disease) ([place])   . Heartburn       Surgical History:  Past Surgical History:  Procedure Laterality Date  . 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 CYSTOSCOPY W/ RETROGRADES Bilateral 08/14/2016   Procedure: CYSTOSCOPY WITH RETROGRADE PYELOGRAM;  Surgeon: Hollice Espy, MD;  Location: [facility] ORS;  Service: Urology;  Laterality: Bilateral;  . LEFT HEART CATH AND CORONARY ANGIOGRAPHY Left 09/05/2016   Procedure: Left Heart Cath and Coronary Angiography;  Surgeon: Teodoro Spray, MD;  Location: [place] [facility];  Service: Cardiovascular;  Laterality: Left;  . neck growth removal     1970's  . TONSILLECTOMY    . TRANSURETHRAL RESECTION OF BLADDER TUMOR N/A 08/14/2016   Procedure: TRANSURETHRAL RESECTION OF BLADDER TUMOR (TURBT);  Surgeon: Hollice Espy, MD;  Location: [facility] ORS;  Service: Urology;  Laterality: N/A;     Home Meds: Prior to Admission medications   Medication Sig Start Date End Date Taking? Authorizing Provider  amiodarone (PACERONE) 200 MG tablet Take  200 mg by mouth daily.    Yes [provider]  aspirin (ASPIRIN 81) 81 MG EC tablet Take 1 tablet by mouth daily.    Yes [provider]  Calcium Carbonate-Vitamin D (OYSTER SHELL CALCIUM 500 + D) 500-125 MG-UNIT TABS Take 1 tablet by mouth daily.    Yes [provider]  donepezil (ARICEPT) 5 MG tablet Take 5 mg by mouth at bedtime.    Yes [provider]  ferrous sulfate 325 (65 FE) MG tablet Take 325 mg by mouth 2 (two) times daily with a meal.   Yes [provider]  FLUoxetine (PROZAC) 20 MG capsule Take 20 mg by mouth daily.    Yes [provider]  furosemide (LASIX) 40 MG tablet Take 1 tablet (40 mg total) by mouth 2 (two) times daily. 04/19/17  Yes Dustin Flock, MD  lidocaine (LIDODERM) 5 % Place 1 patch onto the skin daily as needed (pain). Remove & Discard patch within 12 hours or as directed by MD    Yes [provider]  liver oil-zinc oxide (DESITIN) 40 % ointment Apply 1 application topically as needed for irritation.   Yes [provider]  Melatonin 3 MG TABS Take 1 tablet by mouth at bedtime.   Yes [provider]  metoprolol tartrate (LOPRESSOR) 25 MG tablet Take 12.5 mg by mouth 2 (two) times daily.    Yes [provider]  nitroGLYCERIN (NITROSTAT) 0.4 MG SL tablet Place under the tongue. 02/22/17 02/22/18 Yes [provider]  omeprazole (PRILOSEC) 20 MG capsule Take 20 mg by mouth daily before breakfast.    Yes [provider]  pravastatin (PRAVACHOL) 40 MG tablet Take 1 tablet by mouth at bedtime.  04/13/17  Yes [provider]  senna-docusate (SENOKOT-S) 8.6-50 MG tablet Take 2 tablets by mouth 2 (two) times daily as needed for mild constipation.  10/11/16 10/11/17 Yes [provider]  spironolactone (ALDACTONE) 25 MG tablet Take 1 tablet by mouth daily. 05/21/17 05/21/18 Yes [provider]  warfarin (COUMADIN) 2 MG tablet Take 4 mg by mouth at bedtime.   Yes [provider]    Inpatient Medications:  . amiodarone  200 mg Oral Daily  . aspirin EC  81 mg Oral Daily  . calcium-vitamin D  1 tablet Oral Daily  . donepezil  5 mg Oral QHS  . ferrous sulfate  325 mg Oral BID WC  . FLUoxetine  20 mg Oral Daily  . furosemide  40 mg Oral BID  . heparin  5,000 Units Subcutaneous Q8H  . Melatonin  2.5  mg Oral QHS  . metoprolol tartrate  12.5 mg Oral BID  . pantoprazole  40 mg Oral Daily  . pravastatin  40 mg Oral QHS  . spironolactone  25 mg Oral Daily  . warfarin  4 mg Oral QHS   . sodium chloride 125 mL/hr at 09/03/17 0757    Allergies: No Known Allergies  Social History   Socioeconomic History  . Marital status: Married    Spouse name: Not on file  . Number of children: Not on file  . Years of education: Not on file  . Highest education level: High school graduate  Occupational History  . Not on file  Social Needs  . Financial resource strain: Not hard at all  . Food insecurity:    Worry: Never true    Inability: Never true  . Transportation needs:    Medical: No    Non-medical:  No  Tobacco Use  . Smoking status: Never Smoker  . Smokeless tobacco: Never Used  Substance and Sexual Activity  . Alcohol use: No  . Drug use: No  . Sexual activity: Not Currently  Lifestyle  . Physical activity:    Days per week: 2 days    Minutes per session: 30 min  . Stress: Not at all  Relationships  . Social connections:    Talks on phone: Patient refused    Gets together: Patient refused    Attends religious service: Patient refused    Active member of club or organization: Patient refused    Attends meetings of clubs or organizations: Patient refused    Relationship status: Patient refused  . Intimate partner violence:    Fear of current or ex partner: Not on file    Emotionally abused: Not on file    Physically abused: Not on file    Forced sexual activity: Not on file  Other Topics Concern  . Not on file  Social History Narrative  . Not on file     Family History  Problem Relation Age of Onset  . Heart failure Father   . Heart failure Brother   . Atrial fibrillation Sister   . Prostate cancer Neg Hx   . Kidney cancer Neg Hx   . Bladder Cancer Neg Hx      Review of Systems Positive for weakness syncope Negative for: General:  chills, fever, night sweats  or weight changes.  Cardiovascular: PND orthopnea positive for syncope dizziness  Dermatological skin lesions rashes Respiratory: Cough congestion Urologic: Frequent urination urination at night and hematuria Abdominal: negative for nausea, vomiting, diarrhea, bright red blood per rectum, melena, or hematemesis Neurologic: negative for visual changes, and/or hearing changes  All other systems reviewed and are otherwise negative except as noted above.  Labs: Recent Labs    09/03/17 0128 09/03/17 0627  TROPONINI 0.04* 0.04*   Lab Results  Component Value Date   WBC 4.5 09/03/2017   HGB 14.1 09/03/2017   HCT 42.9 09/03/2017   MCV 95.5 09/03/2017   PLT 134 (L) 09/03/2017    Recent Labs  Lab 09/03/17 0128  NA 134*  K 4.3  CL 99*  CO2 25  BUN 26*  CREATININE 1.29*  CALCIUM 8.7*  PROT 7.8  BILITOT 2.3*  ALKPHOS 204*  ALT 44  AST 52*  GLUCOSE 129*   No results found for: CHOL, HDL, LDLCALC, TRIG No results found for: DDIMER  Radiology/Studies:  Dg Chest Port 1 View  Result Date: 09/03/2017 CLINICAL DATA:  Chest pain after feeling the urge to void. EXAM: PORTABLE CHEST 1 VIEW COMPARISON:  04/17/2017 FINDINGS: Stable cardiomegaly. Aortic atherosclerosis without aneurysm. Low lung volumes with central vascular congestion. Post CABG change of the heart with left atrial appendage clip and aortic valvular replacement noted. No pulmonary consolidation or pneumothorax. There is left basilar atelectasis. Cardiac monitoring leads project over the thorax as well as external defibrillator over the left heart. No acute osseous abnormality. 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 IMPRESSION: Low lung volumes with stable cardiomegaly and aortic atherosclerosis. Post CABG change with mild central vascular congestion. Electronically Signed   By: Ashley Royalty M.D.   On: 09/03/2017 02:13    EKG: Atrial flutter with controlled ventricular rate and left bundle branch  block  Weights: Filed Weights   09/03/17 0122 09/03/17 0618  Weight: 230 lb (104.3 kg) 190 lb 4.8 oz (86.3 kg)  Physical Exam: Blood pressure 99/64, pulse 65, temperature (!) 97.5 F (36.4 C), temperature source Oral, resp. rate 18, height 6' (1.829 m), weight 190 lb 4.8 oz (86.3 kg), SpO2 93 %. Body mass index is 25.81 kg/m. General: Well developed, well nourished, in no acute distress. Head eyes ears nose throat: Normocephalic, atraumatic, sclera non-icteric, no xanthomas, nares are without discharge. No apparent thyromegaly and/or mass  Lungs: Normal respiratory effort.  no wheezes, no rales, few rhonchi.  Heart: RRR with normal S1 S2.  2+ right upper sternal border murmur gallop, no rub, PMI is normal size and placement, carotid upstroke normal without bruit, jugular venous pressure is normal Abdomen: Soft, non-tender, non-distended with normoactive bowel sounds. No hepatomegaly. No rebound/guarding. No obvious abdominal masses. Abdominal aorta is normal size without bruit Extremities: Trace edema. no cyanosis, no clubbing, no ulcers  Peripheral : 2+ bilateral upper extremity pulses, 2+ bilateral femoral pulses, 2+ bilateral dorsal pedal pulse Neuro: Alert and oriented. No facial asymmetry. No focal deficit. Moves all extremities spontaneously. Musculoskeletal: Normal muscle tone without kyphosis Psych:  Responds to questions appropriately with a normal affect.    Assessment: 77 year old male with chronic systolic dysfunction congestive heart failure due to previous cardiomyopathy status post aortic valve replacement and coronary artery bypass grafting improving in recovery after episode of stroke from atrial fibrillation on appropriate medication management now given medication Aricept for memory issues and having weakness and presyncope now resolving today.evidence of myocardial infarction or congestive heart failure  Plan: 1.  Continue amiodarone metoprolol for heart rate  control of atrial fibrillation and/or atrial flutter 2.  Continue anticoagulation for further risk reduction of stroke with atrial fibrillation 3.  High intensity cholesterol therapy with pravastatin 4.  Further consideration of ACE inhibitor with spironolactone as well for cardiomyopathy although would abstain at this time due to concerns of hypotension and syncope 5.  Continue furosemide for cardiomyopathy heart failure once per day watching for dehydration and chronic kidney disease and no change in dosage today 6.  Further evaluation and concerns for side effects of Aricept causing significant weakness and abstain from this medication at this time ~further able to assess its needs 7.  No further cardiac diagnostics necessary at this time  Signed, Corey Skains M.D. [facility] 09/03/2017, 8:26 AM

## 2017-09-03 NOTE — Progress Notes (Signed)
Kyle Gregory provided a follow-up visit to patient to review request for HCPOA. Patient stated that he spoke with someone about the [facility] and declined wanting to complete the paperwork at this time.

## 2017-09-03 NOTE — ED Notes (Signed)
Transport to floor 2A room 252.AS

## 2017-09-03 NOTE — ED Notes (Signed)
Hospitalist at bedside 

## 2017-09-03 NOTE — ED Notes (Signed)
XR at bedside

## 2017-09-03 NOTE — Progress Notes (Signed)
MD was informed of pt's trop. Level 0.04. Patient already has a cardiology consult. Will continue to monitor .

## 2017-09-03 NOTE — Progress Notes (Signed)
Order requisition for [facility] education/prayer. CH provided prayer for patient from outside the room. Patient was not available when [place] rounded. [place] will round at a later time to provided services to the patient. [place] did not want to interrupt the medical staff members in the patient's room.

## 2017-09-03 NOTE — Progress Notes (Signed)
ANTICOAGULATION CONSULT NOTE - Initial Consult  Pharmacy Consult for warfarin Indication: atrial fibrillation  No Known Allergies  Patient Measurements: Height: 6' (182.9 cm) Weight: 190 lb 4.8 oz (86.3 kg) IBW/kg (Calculated) : 77.6  Vital Signs: Temp: 97.5 F (36.4 C) (05/06 0739) Temp Source: Oral (05/06 0739) BP: 99/64 (05/06 0739) Pulse Rate: 65 (05/06 0739)  Labs: Recent Labs    09/03/17 0128 09/03/17 0627 09/03/17 1155  HGB 14.1  --   --   HCT 42.9  --   --   PLT 134*  --   --   LABPROT 19.1*  --   --   INR 1.62  --   --   CREATININE 1.29*  --   --   TROPONINI 0.04* 0.04* 0.05*    Estimated Creatinine Clearance: 52.6 mL/min (A) (by C-G formula based on SCr of 1.29 mg/dL (H)).   Assessment: 77 yo male on warfarin PTA for AFib. 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 INR on admission is subtherapeutic at 1.62.   Attempted to confirm warfarin dose with patient who is unable to state his dose; pt's wife unavailable at this time. Pt's daughter in room is from out of town and unable to confirm med dose.  PTA med list states warfarin 4 mg po qHS.   Goal of Therapy:  INR 2-3 - Cardiology note from 08/24/17 states INR goal between 2 and 3 Monitor platelets by anticoagulation protocol: Yes   Plan:  INR subtherapeutic. Will order warfarin 6 mg PO for tonight. Follow up INR in AM. Will need CBC at least every three days per policy.  Of note pt also on SQ heparin.   Pharmacy will continue to follow.    Rayna Sexton L 09/03/2017,3:10 PM

## 2017-09-03 NOTE — ED Triage Notes (Signed)
Patient was at home and felt he had to void and co CP and wife gave 2 Nitro, patient went down and EMS initially had a P25, BP of 60/47.  Patient is alert and oriented as of reaching ED, is responsive to voice and answers questions appropriately.  Pt has hx of COPD and cardiac bypass x2 according to EMS.

## 2017-09-03 NOTE — ED Provider Notes (Signed)
[facility] Emergency Department Provider Note   ____________________________________________   First MD Initiated Contact with Patient 09/03/17 0119     (approximate)  I have reviewed the triage vital signs and the nursing notes.   HISTORY  Chief Complaint Bradycardia    HPI Kyle Gregory is a 77 y.o. male brought to the ED from home via EMS with a chief complaint of syncope, bradycardia and hypotension.  Patient was at home, felt like he had to void and experience some chest tightness.  Wife gave him 2 nitroglycerin and patient had a syncopal episode.  EMS reports patient's initial heart rate was 25 with BP 60/40 upon their arrival.  They gave him a fluid bolus as well as atropine and patient arrives to the ED with a heart rate of 88 and atrial fibrillation with blood pressure 109/79.  Currently complains of no chest pain.  Denies recent fever, chills, shortness of breath, abdominal pain, nausea, vomiting.  Reportedly patient stooled himself during his syncopal episode.   Past Medical History:  Diagnosis Date  . Anemia   . Aortic stenosis   . Arrhythmia   . Atrial fibrillation ([place])   . Cardiomyopathy ([place])   . CHF (congestive heart failure) ([place])   . COPD (chronic obstructive pulmonary disease) ([place])   . Heartburn     Patient Active Problem List   Diagnosis Date Noted  . Syncope 09/03/2017  . Pressure injury of skin 09/03/2017  . Chronic systolic heart failure ([place]) 05/04/2017  . Lymphedema 05/04/2017  . Morbid obesity with body mass index of 40.0-49.9 ([place]) 07/12/2016  . Aortic stenosis, mild 07/11/2016  . 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 Aortic atherosclerosis ([place]) 06/12/2016  . B12 deficiency 06/12/2016  . Iron deficiency anemia due to chronic blood loss 06/12/2016  . Atrial fibrillation, chronic ([place]) 06/09/2016    Past Surgical History:  Procedure Laterality Date  . CORONARY ARTERY BYPASS GRAFT     triple bypass with  aortic valve replacement  . CYSTOSCOPY W/ RETROGRADES Bilateral 08/14/2016   Procedure: CYSTOSCOPY WITH RETROGRADE PYELOGRAM;  Surgeon: Hollice Espy, MD;  Location: [facility] ORS;  Service: Urology;  Laterality: Bilateral;  . LEFT HEART CATH AND CORONARY ANGIOGRAPHY Left 09/05/2016   Procedure: Left Heart Cath and Coronary Angiography;  Surgeon: Teodoro Spray, MD;  Location: [facility];  Service: Cardiovascular;  Laterality: Left;  . neck growth removal     1970's  . TONSILLECTOMY    . TRANSURETHRAL RESECTION OF BLADDER TUMOR N/A 08/14/2016   Procedure: TRANSURETHRAL RESECTION OF BLADDER TUMOR (TURBT);  Surgeon: Hollice Espy, MD;  Location: [facility] ORS;  Service: Urology;  Laterality: N/A;    Prior to Admission medications   Medication Sig Start Date End Date Taking? Authorizing Provider  amiodarone (PACERONE) 200 MG tablet Take 200 mg by mouth daily.    Yes [provider]  aspirin (ASPIRIN 81) 81 MG EC tablet Take 1 tablet by mouth daily.    Yes [provider]  Calcium Carbonate-Vitamin D (OYSTER SHELL CALCIUM 500 + D) 500-125 MG-UNIT TABS Take 1 tablet by mouth daily.    Yes [provider]  donepezil (ARICEPT) 5 MG tablet Take 5 mg by mouth at bedtime.   Yes [provider]  ferrous sulfate 325 (65 FE) MG tablet Take 325 mg by mouth 2 (two) times daily with a meal.   Yes [provider]  FLUoxetine (PROZAC) 20 MG capsule Take 20 mg by mouth daily.  Yes [provider]  furosemide (LASIX) 40 MG tablet Take 1 tablet (40 mg total) by mouth 2 (two) times daily. 04/19/17  Yes Dustin Flock, MD  lidocaine (LIDODERM) 5 % Place 1 patch onto the skin daily as needed (pain). Remove & Discard patch within 12 hours or as directed by MD    Yes [provider]  liver oil-zinc oxide (DESITIN) 40 % ointment Apply 1 application topically as needed for irritation.   Yes [provider]  Melatonin 3 MG TABS Take 1 tablet by  mouth at bedtime.   Yes [provider]  metoprolol tartrate (LOPRESSOR) 25 MG tablet Take 12.5 mg by mouth 2 (two) times daily.    Yes [provider]  nitroGLYCERIN (NITROSTAT) 0.4 MG SL tablet Place under the tongue. 02/22/17 02/22/18 Yes [provider]  omeprazole (PRILOSEC) 20 MG capsule Take 20 mg by mouth daily before breakfast.    Yes [provider]  pravastatin (PRAVACHOL) 40 MG tablet Take 1 tablet by mouth at bedtime.  04/13/17  Yes [provider]  senna-docusate (SENOKOT-S) 8.6-50 MG tablet Take 2 tablets by mouth 2 (two) times daily as needed for mild constipation.  10/11/16 10/11/17 Yes [provider]  spironolactone (ALDACTONE) 25 MG tablet Take 1 tablet by mouth daily. 05/21/17 05/21/18 Yes [provider]  warfarin (COUMADIN) 2 MG tablet Take 4 mg by mouth at bedtime.   Yes [provider]    Allergies Patient has no known allergies.  Family History  Problem Relation Age of Onset  . Heart failure Father   . Heart failure Brother   . Atrial fibrillation Sister   . Prostate cancer Neg Hx   . Kidney cancer Neg Hx   . Bladder Cancer Neg Hx     Social History Social History   Tobacco Use  . Smoking status: Never Smoker  . Smokeless tobacco: Never Used  Substance Use Topics  . Alcohol use: No  . Drug use: No    Review of Systems  Constitutional: No fever/chills. Eyes: No visual changes. ENT: No sore throat. Cardiovascular: Positive for chest pain. Respiratory: Denies shortness of breath. Gastrointestinal: No abdominal pain.  No nausea, no vomiting.  No diarrhea.  No constipation. Genitourinary: Negative for dysuria. Musculoskeletal: Negative for back pain. Skin: Negative for rash. Neurological: Negative for headaches, focal weakness or numbness.   ____________________________________________   PHYSICAL EXAM:  VITAL SIGNS: ED Triage Vitals  Enc Vitals Group     BP      Pulse       Resp      Temp      Temp src      SpO2      Weight      Height      Head Circumference      Peak Flow      Pain Score      Pain Loc      Pain Edu?      Excl. in [place]?     Constitutional: Alert and oriented. Well appearing and in mild acute distress. Eyes: Conjunctivae are normal. PERRL. EOMI. Head: Atraumatic. Nose: No congestion/rhinnorhea. Mouth/Throat: Mucous membranes are moist.  Oropharynx non-erythematous. Neck: No stridor.  No carotid bruits. Cardiovascular: Normal rate, irregular rhythm. Grossly normal heart sounds.  Good peripheral circulation. Respiratory: Normal respiratory effort.  No retractions. Lungs CTAB. Gastrointestinal: Soft and nontender. No distention. No abdominal bruits. No CVA tenderness. Musculoskeletal: No lower extremity tenderness nor edema.  No joint  effusions. Neurologic:  Normal speech and language. No gross focal neurologic deficits are appreciated.  Skin:  Skin is warm, dry and intact. No rash noted. Psychiatric: Mood and affect are normal. Speech and behavior are normal.  ____________________________________________   LABS (all labs ordered are listed, but only abnormal results are displayed)  Labs Reviewed  CBC WITH DIFFERENTIAL/PLATELET - Abnormal; Notable for the following components:      Result Value   RDW 15.3 (*)    Platelets 134 (*)    Lymphs Abs 0.7 (*)    All other components within normal limits  COMPREHENSIVE METABOLIC PANEL - Abnormal; Notable for the following components:   Sodium 134 (*)    Chloride 99 (*)    Glucose, Bld 129 (*)    BUN 26 (*)    Creatinine, Ser 1.29 (*)    Calcium 8.7 (*)    AST 52 (*)    Alkaline Phosphatase 204 (*)    Total Bilirubin 2.3 (*)    GFR calc non Af Amer 52 (*)    All other components within normal limits  TROPONIN I - Abnormal; Notable for the following components:   Troponin I 0.04 (*)    All other components within normal limits  GLUCOSE, CAPILLARY - Abnormal; Notable for the  following components:   Glucose-Capillary 110 (*)    All other components within normal limits  TSH  TROPONIN I  TROPONIN I  TROPONIN I   ____________________________________________  EKG  ED ECG REPORT I, Aulton Routt J, the attending physician, personally viewed and interpreted this ECG.   Date: 09/03/2017  EKG Time: 0118  Rate: 82  Rhythm: atrial fibrillation, rate 82  Axis: Normal  Intervals:nonspecific intraventricular conduction delay  ST&T Change: Nonspecific  ____________________________________________  RADIOLOGY  ED MD interpretation: Mild pulmonary vascular congestion  Official radiology report(s): Dg Chest Port 1 View  Result Date: 09/03/2017 CLINICAL DATA:  Chest pain after feeling the urge to void. EXAM: PORTABLE CHEST 1 VIEW COMPARISON:  04/17/2017 FINDINGS: Stable cardiomegaly. Aortic atherosclerosis without aneurysm. Low lung volumes with central vascular congestion. Post CABG change of the heart with left atrial appendage clip and aortic valvular replacement noted. No pulmonary consolidation or pneumothorax. There is left basilar atelectasis. Cardiac monitoring leads project over the thorax as well as external defibrillator over the left heart. No acute osseous abnormality. 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 IMPRESSION: Low lung volumes with stable cardiomegaly and aortic atherosclerosis. Post CABG change with mild central vascular congestion. Electronically Signed   By: Ashley Royalty M.D.   On: 09/03/2017 02:13    ____________________________________________   PROCEDURES  Procedure(s) performed: None  Procedures  Critical Care performed: Yes, see critical care note(s)   CRITICAL CARE Performed by: Paulette Blanch   Total critical care time: 30 minutes  Critical care time was exclusive of separately billable procedures and treating other patients.  Critical care was necessary to treat or prevent imminent or life-threatening  deterioration.  Critical care was time spent personally by me on the following activities: development of treatment plan with patient and/or surrogate as well as nursing, discussions with consultants, evaluation of patient's response to treatment, examination of patient, obtaining history from patient or surrogate, ordering and performing treatments and interventions, ordering and review of laboratory studies, ordering and review of radiographic studies, pulse oximetry and re-evaluation of patient's condition.  ____________________________________________   INITIAL IMPRESSION / ASSESSMENT AND PLAN / ED COURSE  As part of my medical decision making, I reviewed  the following data within the [facility] notes reviewed and incorporated, Labs reviewed, EKG interpreted, Old chart reviewed, Radiograph reviewed, Discussed with admitting physician and Notes from prior ED visits   77 year old male with CHF, atrial fibrillation, aortic stenosis on warfarin who presents status post syncopal episode after taking 2 nitroglycerin for chest pain.  Patient was initially bradycardic and hypotensive; arrives to the ED and in atrial fibrillation with controlled rate and improved blood pressure. Differential diagnosis includes, but is not limited to, ACS, aortic dissection, pulmonary embolism, cardiac tamponade, pneumothorax, pneumonia, pericarditis, myocarditis, GI-related causes including esophagitis/gastritis, and musculoskeletal chest wall pain.     We will continue IV fluid resuscitation.  Pacer pads placed immediately upon patient's arrival to the treatment room.  Will obtain screening lab work including troponin, chest x-ray.  Continue close cardiac monitoring.  Anticipate hospitalization.   Clinical Course as of Sep 03 636  Mon Sep 03, 2017  0207 Patient resting in no acute distress.  Remains hemodynamically stable.  Noted elevated troponin.  Also noted elevated bilirubin which is  down from 4 months ago.  Will discuss with hospitalist to evaluate patient in the emergency department for admission.   [JS]    Clinical Course User Index [JS] Paulette Blanch, MD     ____________________________________________   FINAL CLINICAL IMPRESSION(S) / ED DIAGNOSES  Final diagnoses:  Chest pain, unspecified type  Syncope, unspecified syncope type  Bradycardia  Hypotension, unspecified hypotension type  Elevated troponin     ED Discharge Orders    None       Note:  This document was prepared using Dragon voice recognition software and may include unintentional dictation errors.    Paulette Blanch, MD 09/03/17 [phone]

## 2017-09-03 NOTE — Plan of Care (Signed)
  Problem: Education: Goal: Knowledge of General Education information will improve Outcome: Progressing   Problem: Activity: Goal: Risk for activity intolerance will decrease Outcome: Progressing   Problem: Elimination: Goal: Will not experience complications related to bowel motility Outcome: Progressing Goal: Will not experience complications related to urinary retention Outcome: Progressing   Problem: Safety: Goal: Ability to remain free from injury will improve Outcome: Progressing   Problem: Skin Integrity: Goal: Risk for impaired skin integrity will decrease Outcome: Progressing   Problem: Spiritual Needs Goal: Ability to function at adequate level Outcome: Progressing

## 2017-09-03 NOTE — ED Notes (Signed)
Date and time results received: 09/03/17 0212  Test: Troponin Critical Value: 0.04  Name of Provider Notified: Beather Arbour  Orders Received? Or Actions Taken?: None at this time.

## 2017-09-04 ENCOUNTER — Observation Stay: Payer: Medicare Other

## 2017-09-04 DIAGNOSIS — R55 Syncope and collapse: Secondary | ICD-10-CM | POA: Diagnosis not present

## 2017-09-04 LAB — BASIC METABOLIC PANEL
ANION GAP: 8 (ref 5–15)
BUN: 26 mg/dL — ABNORMAL HIGH (ref 6–20)
CALCIUM: 8.7 mg/dL — AB (ref 8.9–10.3)
CO2: 28 mmol/L (ref 22–32)
Chloride: 100 mmol/L — ABNORMAL LOW (ref 101–111)
Creatinine, Ser: 1.09 mg/dL (ref 0.61–1.24)
Glucose, Bld: 90 mg/dL (ref 65–99)
Potassium: 4 mmol/L (ref 3.5–5.1)
Sodium: 136 mmol/L (ref 135–145)

## 2017-09-04 LAB — PROTIME-INR
INR: 1.92
PROTHROMBIN TIME: 21.8 s — AB (ref 11.4–15.2)

## 2017-09-04 MED ORDER — FUROSEMIDE 10 MG/ML IJ SOLN
40.0000 mg | Freq: Once | INTRAMUSCULAR | Status: AC
  Administered 2017-09-04: 40 mg via INTRAVENOUS
  Filled 2017-09-04: qty 4

## 2017-09-04 MED ORDER — WARFARIN SODIUM 4 MG PO TABS
4.0000 mg | ORAL_TABLET | Freq: Once | ORAL | Status: DC
  Filled 2017-09-04: qty 1

## 2017-09-04 NOTE — Progress Notes (Signed)
Patient is being discharge home today as per order, discharge instruction provided, iv removed tele removed , no distress noted at the time of discharge

## 2017-09-04 NOTE — Progress Notes (Signed)
PT Cancellation Note  Patient Details Name: Kyle Gregory MRN: [id number] DOB: [birth year]   Cancelled Treatment:    Reason Eval/Treat Not Completed: Patient at procedure or test/unavailable. Order received.  Chart reviewed.  Pt with nursing and then going to Ultrasound.  Will re-attempt later if time allows.   Roxanne Gates, PT, DPT 09/04/2017, 8:23 AM

## 2017-09-04 NOTE — Plan of Care (Signed)
  Problem: Education: Goal: Knowledge of General Education information will improve Outcome: Adequate for Discharge   Problem: Activity: Goal: Risk for activity intolerance will decrease Outcome: Adequate for Discharge

## 2017-09-04 NOTE — Discharge Summary (Signed)
[facility] [place] NAME: Kyle Gregory    MR#:  [id number]  DATE OF BIRTH:  03-10-1941  DATE OF ADMISSION:  09/03/2017   ADMITTING PHYSICIAN: Harrie Foreman, MD  DATE OF DISCHARGE: 09/04/17  PRIMARY CARE PHYSICIAN: Rusty Aus, MD   ADMISSION DIAGNOSIS:   Bradycardia [R00.1] Elevated troponin [R74.8] Hypotension, unspecified hypotension type [I95.9] Syncope, unspecified syncope type [R55] Chest pain, unspecified type [R07.9]  DISCHARGE DIAGNOSIS:   Active Problems:   Syncope   Pressure injury of skin   SECONDARY DIAGNOSIS:   Past Medical History:  Diagnosis Date  . Anemia   . Aortic stenosis   . Arrhythmia   . Atrial fibrillation ([place])   . Cardiomyopathy ([place])   . CHF (congestive heart failure) ([place])   . COPD (chronic obstructive pulmonary disease) ([place])   . Heartburn     HOSPITAL COURSE:   77 year old male with past medical history significant for CAD status post CABG, atrial fibrillation on warfarin, dilated cardiomyopathy, aortic stenosis status post valve replacement surgery, CKD stage III was brought to the hospital secondary to an episode of dizziness and presyncope  1.  Dizziness and presyncope- symptoms started after starting a new medication aricept -Monitored on telemetry, no cardiac changes.  Appreciate cardiology consult -No significant bradycardia or further syncope here. -Pending physical therapy consult -Aricept was held in the hospital.  2.  CAD status post CABG-stable.  Surgery about 6 to 8 months ago. -Continue aspirin, metoprolol, statin.  3.  Paroxysmal A. fib-rate controlled.  Continue warfarin for anticoagulation.  Patient on metoprolol  4.  Ischemic cardiomyopathy, known EF of 30 to 35%. -Continue Lasix and Aldactone.  Not fluid overloaded at this time -Salt restriction, daily weights.  5.  GERD-on PPI  6.  Anemia of chronic disease-on iron supplements  Physical therapy.  Patient  has home health at home.  Can resume services   DISCHARGE CONDITIONS:   Guarded  CONSULTS OBTAINED:   Treatment Team:  Corey Skains, MD  DRUG ALLERGIES:   No Known Allergies DISCHARGE MEDICATIONS:   Allergies as of 09/04/2017   No Known Allergies     Medication List    STOP taking these medications   donepezil 5 MG tablet Commonly known as:  ARICEPT     TAKE these medications   amiodarone 200 MG tablet Commonly known as:  PACERONE Take 200 mg by mouth daily.   ASPIRIN 81 81 MG EC tablet Generic drug:  aspirin Take 1 tablet by mouth daily.   ferrous sulfate 325 (65 FE) MG tablet Take 325 mg by mouth 2 (two) times daily with a meal.   FLUoxetine 20 MG capsule Commonly known as:  PROZAC Take 20 mg by mouth daily.   furosemide 40 MG tablet Commonly known as:  LASIX Take 1 tablet (40 mg total) by mouth 2 (two) times daily.   lidocaine 5 % Commonly known as:  LIDODERM Place 1 patch onto the skin daily as needed (pain). Remove & Discard patch within 12 hours or as directed by MD   liver oil-zinc oxide 40 % ointment Commonly known as:  DESITIN Apply 1 application topically as needed for irritation.   Melatonin 3 MG Tabs Take 1 tablet by mouth at bedtime.   metoprolol tartrate 25 MG tablet Commonly known as:  LOPRESSOR Take 12.5 mg by mouth 2 (two) times daily.   nitroGLYCERIN 0.4 MG SL tablet Commonly known as:  NITROSTAT Place under  the tongue.   omeprazole 20 MG capsule Commonly known as:  PRILOSEC Take 20 mg by mouth daily before breakfast.   OYSTER SHELL CALCIUM 500 + D 500-125 MG-UNIT Tabs Generic drug:  Calcium Carbonate-Vitamin D Take 1 tablet by mouth daily.   pravastatin 40 MG tablet Commonly known as:  PRAVACHOL Take 1 tablet by mouth at bedtime.   senna-docusate 8.6-50 MG tablet Commonly known as:  Senokot-S Take 2 tablets by mouth 2 (two) times daily as needed for mild constipation.   spironolactone 25 MG tablet Commonly known  as:  ALDACTONE Take 1 tablet by mouth daily.   warfarin 2 MG tablet Commonly known as:  COUMADIN Take 4 mg by mouth at bedtime.        DISCHARGE INSTRUCTIONS:   1. PCP f/u in 1-2 weeks 2. Cardiology f/u in 2-3 weeks  DIET:   Cardiac diet  ACTIVITY:   Activity as tolerated  OXYGEN:   Home Oxygen: No.  Oxygen Delivery: room air  DISCHARGE LOCATION:   home   If you experience worsening of your admission symptoms, develop shortness of breath, life threatening emergency, suicidal or homicidal thoughts you must seek medical attention immediately by calling 911 or calling your MD immediately  if symptoms less severe.  You Must read complete instructions/literature along with all the possible adverse reactions/side effects for all the Medicines you take and that have been prescribed to you. Take any new Medicines after you have completely understood and accpet all the possible adverse reactions/side effects.   Please note  You were cared for by a hospitalist during your hospital stay. If you have any questions about your discharge medications or the care you received while you were in the hospital after you are discharged, you can call the unit and asked to speak with the hospitalist on call if the hospitalist that took care of you is not available. Once you are discharged, your primary care physician will handle any further medical issues. Please note that NO REFILLS for any discharge medications will be authorized once you are discharged, as it is imperative that you return to your primary care physician (or establish a relationship with a primary care physician if you do not have one) for your aftercare needs so that they can reassess your need for medications and monitor your lab values.    On the day of Discharge:  VITAL SIGNS:   Blood pressure (!) 117/59, pulse 68, temperature 98 F (36.7 C), resp. rate 18, height 6' (1.829 m), weight 85.9 kg (189 lb 4.8 oz), SpO2 98  %.  PHYSICAL EXAMINATION:    GENERAL:  77 y.o.-year-old patient lying in the bed with no acute distress.  EYES: Pupils equal, round, reactive to light and accommodation. No scleral icterus. Extraocular muscles intact.  HEENT: Head atraumatic, normocephalic. Oropharynx and nasopharynx clear.  NECK:  Supple, no jugular venous distention. No thyroid enlargement, no tenderness.  LUNGS: Normal breath sounds bilaterally, no wheezing, rales,rhonchi or crepitation. No use of accessory muscles of respiration.  Decreased bibasilar breath sounds heard CARDIOVASCULAR: S1, S2 normal. No  rubs, or gallops. 3/6 systolic murmur heard ABDOMEN: Soft, non-tender, non-distended. Bowel sounds present. No organomegaly or mass.  EXTREMITIES: No cyanosis, or clubbing. 1+ bilateral pedal edema NEUROLOGIC: Cranial nerves II through XII are intact. Muscle strength 5/5 in all extremities. Sensation intact. Gait not checked.  PSYCHIATRIC: The patient is alert and oriented x 3.  SKIN: No obvious rash, lesion, or ulcer.   DATA REVIEW:  CBC Recent Labs  Lab 09/03/17 0128  WBC 4.5  HGB 14.1  HCT 42.9  PLT 134*    Chemistries  Recent Labs  Lab 09/03/17 0128 09/04/17 0632  NA 134* 136  K 4.3 4.0  CL 99* 100*  CO2 25 28  GLUCOSE 129* 90  BUN 26* 26*  CREATININE 1.29* 1.09  CALCIUM 8.7* 8.7*  AST 52*  --   ALT 44  --   ALKPHOS 204*  --   BILITOT 2.3*  --      Microbiology Results  Results for orders placed or performed in visit on 09/21/16  Microscopic Examination     Status: Abnormal   Collection Time: 09/21/16  3:21 PM  Result Value Ref Range Status   WBC, UA 6-10 (A) 0 - 5 /hpf Final   RBC, UA None seen 0 - 2 /hpf Final   Epithelial Cells (non renal) 0-10 0 - 10 /hpf Final   Mucus, UA Present (A) Not Estab. Final   Bacteria, UA None seen None seen/Few Final    RADIOLOGY:  No results found.   Management plans discussed with the patient, family and they are in agreement.  CODE STATUS:      Code Status Orders  (From admission, onward)        Start     Ordered   09/03/17 0618  Full code  Continuous     09/03/17 0617    Code Status History    Date Active Date Inactive Code Status Order ID Comments User Context   04/17/2017 1310 04/19/2017 1711 Full Code [id number]  Epifanio Lesches, MD ED      TOTAL TIME TAKING CARE OF THIS PATIENT: 38 minutes.    Gladstone Lighter M.D on 09/04/2017 at 10:57 AM  Between 7am to 6pm - Pager - [phone]  After 6pm go to [email or web address] - Proofreader  [facility]  Office  [phone]  CC: Primary care physician; Rusty Aus, MD   Note: This dictation was prepared with Dragon dictation along with smaller phrase technology. Any transcriptional errors that result from this process are unintentional.

## 2017-09-04 NOTE — Evaluation (Signed)
Physical Therapy Evaluation Patient Details Name: Kyle Gregory MRN: [id number] DOB: [birth year] Today's Date: 09/04/2017   History of Present Illness  Pt is a 77 year old male admitted for bradycardia and chest pain following chest pain, near syncope and collapse in his home.  PMH includes SOPD, CHF, cardiomyopathy, atrial fibrillaiton, arrhythmia and aortic stenosis.  Clinical Impression  Pt is a 77 year old male who lives in a two story home with his wife.  Pt ambulates with a RW or SPC at baseline and is dependent with some ADL's.  He states that he understands that a RW will be safer for him to use for mobility following DC from hospital.  Pt in bed upon PT arrival and reports no pain.  He is able to perform bed mobility mod I and sit at EOB with good balance.  Pt presents with weakness of UE and fair LE strength bilaterally.  Pt able to perform STS with close supervisoin for min VC's for safe management of RW.  He is able to ambulate 40 ft in room with RW and min A for management of obstacles in narrow spaces.  Pt able to reach overhead with limited shoulder ROM and no posterior LOB and kneel to pick an object up from floor with unilateral support.  Pt demonstrates knowledge of safe hand placement for STS transfers.  Pt states that he feels home health PT has been helping him with balance and pt will continue to benefit from skilled PT with focus on strength, balance, functional mobility and safe management of AD.    Follow Up Recommendations Home health PT    Equipment Recommendations  None recommended by PT    Recommendations for Other Services       Precautions / Restrictions Precautions Precautions: Fall Precaution Comments: Recent fall Restrictions Weight Bearing Restrictions: No      Mobility  Bed Mobility Overal bed mobility: Modified Independent             General bed mobility comments: Uses bed rail and moves slowly but able to perform without  assistance.  Transfers Overall transfer level: Needs assistance Equipment used: Rolling walker (2 wheeled) Transfers: Sit to/from Stand Sit to Stand: Supervision         General transfer comment: Pt able to stand from bedside with min VC's for proper use of RW.  Able to stand for 1-2 min without report of dizziness.  Ambulation/Gait Ambulation/Gait assistance: Min assist Ambulation Distance (Feet): 40 Feet Assistive device: Rolling walker (2 wheeled)     Gait velocity interpretation: <1.8 ft/sec, indicate of risk for recurrent falls General Gait Details: Ambulated in room with min A to navigate obstacles in a narrow pathway.  Pt demosntrates low foot clearance and decreased hip and knee flexion in swing phase.    Stairs            Wheelchair Mobility    Modified Rankin (Stroke Patients Only)       Balance Overall balance assessment: Modified Independent(Able to reach overhead with limited shoulder ROM without posterior LOB.  Able to kneel to pick object up from floor with unilateral UE support on counter.)                                           Pertinent Vitals/Pain Pain Assessment: No/denies pain    Home Living Family/patient expects to be discharged  to:: Private residence Living Arrangements: Spouse/significant other Available Help at Discharge: Family Type of Home: House Home Access: [place]: Two level;Able to live on main level with bedroom/bathroom Home Equipment: Shower seat;Walker - 2 wheels;Wheelchair - manual;Cane - single point      Prior Function Level of Independence: Needs assistance   Gait / Transfers Assistance Needed: Has been using SPC but states that RW is safer for him to use.  ADL's / Homemaking Assistance Needed: Pt has a shower bench which he sits on and wife assists with showers occasionally        Hand Dominance   Dominant Hand: Right    Extremity/Trunk Assessment   Upper  Extremity Assessment Upper Extremity Assessment: Generalized weakness(Grossly 3+/5 bilaterally.)    Lower Extremity Assessment Lower Extremity Assessment: Overall WFL for tasks assessed(Grossly 4-/5 bilaterally.)    Cervical / Trunk Assessment Cervical / Trunk Assessment: Normal  Communication   Communication: No difficulties  Cognition Arousal/Alertness: Awake/alert Behavior During Therapy: WFL for tasks assessed/performed Overall Cognitive Status: Within Functional Limits for tasks assessed                                 General Comments: A&O to self and birthdate      General Comments      Exercises     Assessment/Plan    PT Assessment Patient needs continued PT services  PT Problem List Decreased strength;Decreased mobility;Decreased balance;Decreased knowledge of use of DME;Decreased activity tolerance       PT Treatment Interventions DME instruction;Therapeutic activities;Cognitive remediation;Gait training;Therapeutic exercise;Patient/family education;Stair training;Balance training;Functional mobility training;Neuromuscular re-education    PT Goals (Current goals can be found in the Care Plan section)  Acute Rehab PT Goals Patient Stated Goal: To return home and continue to work with home health PT. PT Goal Formulation: With patient Time For Goal Achievement: 09/18/17 Potential to Achieve Goals: Good    Frequency Min 2X/week   Barriers to discharge        Co-evaluation               AM-PAC PT "6 Clicks" Daily Activity  Outcome Measure Difficulty turning over in bed (including adjusting bedclothes, sheets and blankets)?: A Little Difficulty moving from lying on back to sitting on the side of the bed? : A Little Difficulty sitting down on and standing up from a chair with arms (e.g., wheelchair, bedside commode, etc,.)?: A Little Help needed moving to and from a bed to chair (including a wheelchair)?: A Little Help needed walking in  hospital room?: A Little Help needed climbing 3-5 steps with a railing? : A Little 6 Click Score: 18    End of Session Equipment Utilized During Treatment: Gait belt Activity Tolerance: Patient tolerated treatment well Patient left: in chair;with call bell/phone within reach;with chair alarm set Nurse Communication: Mobility status PT Visit Diagnosis: History of falling (Z91.81);Muscle weakness (generalized) (M62.81)    Time: 1130-1155 PT Time Calculation (min) (ACUTE ONLY): 25 min   Charges:   PT Evaluation $PT Eval Low Complexity: 1 Low     PT G Codes:   PT G-Codes **NOT FOR INPATIENT CLASS** Functional Assessment Tool Used: AM-PAC 6 Clicks Basic Mobility    Roxanne Gates, PT, DPT   Roxanne Gates 09/04/2017, 12:04 PM

## 2017-09-04 NOTE — Care Management (Signed)
Patient confused and unable to reach wife by phone on either of her numbers. Unable to eave message.   She is not at bedside.

## 2017-09-04 NOTE — Progress Notes (Signed)
[facility] Cardiology [facility] Encounter Note  Patient: Kyle Gregory / Admit Date: 09/03/2017 / Date of Encounter: 09/04/2017, 8:51 AM   Subjective: Patient feeling much better at this time with less evidence of weakness fatigue.  No evidence of syncope or significant dizziness Telemetry is continued variable heart rate but overall heart rate in the 60 to 70 bpm range.  No evidence of hypotension Further consideration of medications causing symptoms  Review of Systems: Positive for: None Negative for: Vision change, hearing change, syncope, dizziness, nausea, vomiting,diarrhea, bloody stool, stomach pain, cough, congestion, diaphoresis, urinary frequency, urinary pain,skin lesions, skin rashes Others previously listed  Objective: Telemetry: Atrial flutter with variable heart rate Physical Exam: Blood pressure (!) 117/59, pulse 68, temperature 98 F (36.7 C), resp. rate 18, height 6' (1.829 m), weight 189 lb 4.8 oz (85.9 kg), SpO2 98 %. Body mass index is 25.67 kg/m. General: Well developed, well nourished, in no acute distress. Head: Normocephalic, atraumatic, sclera non-icteric, no xanthomas, nares are without discharge. Neck: No apparent masses Lungs: Normal respirations with no wheezes, no rhonchi, no rales , no crackles   Heart: Regular rate and rhythm, normal S1 S2, 2+ aortic murmur, no rub, no gallop, PMI is normal size and placement, carotid upstroke normal without bruit, jugular venous pressure normal Abdomen: Soft, non-tender, non-distended with normoactive bowel sounds. No hepatosplenomegaly. Abdominal aorta is normal size without bruit Extremities: Trace edema, no clubbing, no cyanosis, no ulcers,  Peripheral: 2+ radial, 2+ femoral, 2+ dorsal pedal pulses Neuro: Alert and oriented. Moves all extremities spontaneously. Psych:  Responds to questions appropriately with a normal affect.   Intake/Output Summary (Last 24 hours) at 09/04/2017 0851 Last data filed at 09/04/2017  0350 Gross per 24 hour  Intake 1023.75 ml  Output 1275 ml  Net -251.25 ml    Inpatient Medications:  . amiodarone  200 mg Oral Daily  . aspirin EC  81 mg Oral Daily  . calcium-vitamin D  1 tablet Oral Daily  . ferrous sulfate  325 mg Oral BID WC  . FLUoxetine  20 mg Oral Daily  . furosemide  40 mg Oral BID  . heparin  5,000 Units Subcutaneous Q8H  . Melatonin  2.5 mg Oral QHS  . metoprolol tartrate  12.5 mg Oral BID  . pantoprazole  40 mg Oral Daily  . pravastatin  40 mg Oral QHS  . spironolactone  25 mg Oral Daily  . warfarin  4 mg Oral ONCE-1800  . Warfarin - Pharmacist Dosing Inpatient   Does not apply q1800   Infusions:   Labs: Recent Labs    09/03/17 0128 09/04/17 0632  NA 134* 136  K 4.3 4.0  CL 99* 100*  CO2 25 28  GLUCOSE 129* 90  BUN 26* 26*  CREATININE 1.29* 1.09  CALCIUM 8.7* 8.7*   Recent Labs    09/03/17 0128  AST 52*  ALT 44  ALKPHOS 204*  BILITOT 2.3*  PROT 7.8  ALBUMIN 3.5   Recent Labs    09/03/17 0128  WBC 4.5  NEUTROABS 3.1  HGB 14.1  HCT 42.9  MCV 95.5  PLT 134*   Recent Labs    09/03/17 0128 09/03/17 0627 09/03/17 1155 09/03/17 2008  TROPONINI 0.04* 0.04* 0.05* 0.05*   Invalid input(s): POCBNP No results for input(s): HGBA1C in the last 72 hours.   Weights: Filed Weights   09/03/17 0122 09/03/17 0618 09/04/17 0532  Weight: 230 lb (104.3 kg) 190 lb 4.8 oz (86.3 kg) 189 lb  4.8 oz (85.9 kg)     Radiology/Studies:  Dg Chest Port 1 View  Result Date: 09/03/2017 CLINICAL DATA:  Chest pain after feeling the urge to void. EXAM: PORTABLE CHEST 1 VIEW COMPARISON:  04/17/2017 FINDINGS: Stable cardiomegaly. Aortic atherosclerosis without aneurysm. Low lung volumes with central vascular congestion. Post CABG change of the heart with left atrial appendage clip and aortic valvular replacement noted. No pulmonary consolidation or pneumothorax. There is left basilar atelectasis. Cardiac monitoring leads project over the thorax as well  as external defibrillator over the left heart. No acute osseous abnormality. 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 IMPRESSION: Low lung volumes with stable cardiomegaly and aortic atherosclerosis. Post CABG change with mild central vascular congestion. Electronically Signed   By: Ashley Royalty M.D.   On: 09/03/2017 02:13     Assessment and Recommendation  77 y.o. male with paroxysmal nonvalvular atrial fibrillation dilated cardiomyopathy status post aortic valve replacement and coronary bypass graft with chronic kidney disease stage III and recent dizziness weakness and presyncope possibly consistent with medication side effects and no current evidence of primary cardiac abnormality 1.  Continuation of amiodarone and metoprolol for heart rate control of atrial fibrillation watching closely for any significant bradycardia and further syncope 2.  Continue warfarin for further risk reduction in stroke with atrial fibrillation 3.  No additional medication management for hypertension control due to concerns of hypotension and dizziness 4.  Intensity cholesterol therapy 5.  Rehabilitation and physical activity today watching for any worsening signs or symptoms of presyncope and/or weakness 6.  Consider Aricept as a possible primary cause of incident and will discontinue for now and reinstate as an outpatient if necessary  Signed, Serafina Royals M.D. FACC

## 2017-09-04 NOTE — Care Management Obs Status (Signed)
[place] NOTIFICATION   Patient Details  Name: KASHTYN JANKOWSKI MRN: [id number] Date of Birth: Jan 24, 1941   Medicare Observation Status Notification Given:  Yes    Jolly Mango, RN 09/04/2017, 12:04 PM

## 2017-09-05 NOTE — Progress Notes (Deleted)
Patient ID: Kyle Gregory, male    DOB: Oct 14, 1940, 76 y.o.   MRN: [id number]  HPI  Kyle Gregory is a 77 y/o male with a history of atrial fibrillation, aortic stenosis, anemia and chronic heart failure.   Echo report from 10/12/16 reviewed and showed an EF of 30% along with mild Kyle/TR. Cardiac catheterization done 09/05/16 showed multi-vessel disease with an estimated EF of 25-35%.   Admitted 09/03/17 due to dizziness and syncope after starting aricept. Cardiology consult obtained. PT consult was also done and he was discharged the following day.  Admitted 04/17/17 due to HF exacerbation. Initially treated with IV furosemide and then transitioned to oral diuretics. Discharged after 2 days. Was in the ED 04/15/17 due to HF exacerbation. Given IV lasix and discharged home. Was in the ED 04/13/17 due to supratherapeutic INR but without evidence of hemorrhage. CT done which showed no acute abnormality. Discharged home after medications adjusted.   He presents today for a follow-up visit with a chief complaint of   Past Medical History:  Diagnosis Date  . Anemia   . Aortic stenosis   . Arrhythmia   . Atrial fibrillation ([place])   . Cardiomyopathy ([place])   . CHF (congestive heart failure) ([place])   . COPD (chronic obstructive pulmonary disease) ([place])   . Heartburn    Past Surgical History:  Procedure Laterality Date  . 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 CYSTOSCOPY W/ RETROGRADES Bilateral 08/14/2016   Procedure: CYSTOSCOPY WITH RETROGRADE PYELOGRAM;  Surgeon: Hollice Espy, MD;  Location: [facility] ORS;  Service: Urology;  Laterality: Bilateral;  . LEFT HEART CATH AND CORONARY ANGIOGRAPHY Left 09/05/2016   Procedure: Left Heart Cath and Coronary Angiography;  Surgeon: Teodoro Spray, MD;  Location: [place] [facility];  Service: Cardiovascular;  Laterality: Left;  . neck growth removal     1970's  . TONSILLECTOMY    . TRANSURETHRAL RESECTION OF BLADDER TUMOR  N/A 08/14/2016   Procedure: TRANSURETHRAL RESECTION OF BLADDER TUMOR (TURBT);  Surgeon: Hollice Espy, MD;  Location: [facility] ORS;  Service: Urology;  Laterality: N/A;   Family History  Problem Relation Age of Onset  . Heart failure Father   . Heart failure Brother   . Atrial fibrillation Sister   . Prostate cancer Neg Hx   . Kidney cancer Neg Hx   . Bladder Cancer Neg Hx    Social History   Tobacco Use  . Smoking status: Never Smoker  . Smokeless tobacco: Never Used  Substance Use Topics  . Alcohol use: No   No Known Allergies    Review of Systems  Constitutional: Positive for fatigue. Negative for appetite change.  HENT: Negative for congestion, postnasal drip and sore throat.   Eyes: Negative.   Respiratory: Positive for shortness of breath. Negative for cough and chest tightness.   Cardiovascular: Positive for leg swelling. Negative for chest pain and palpitations.  Gastrointestinal: Negative for abdominal distention and abdominal pain.  Endocrine: Negative.   Genitourinary: Negative.   Musculoskeletal: Negative for back pain and neck pain.  Skin: Negative.   Allergic/Immunologic: Negative.   Neurological: Positive for light-headedness (rarely). Negative for dizziness.  Hematological: Negative for adenopathy. Bruises/bleeds easily.  Psychiatric/Behavioral: Positive for sleep disturbance (wake up frequently due to urination). Negative for dysphoric mood. The patient is not nervous/anxious.      Physical Exam  Constitutional: He is oriented to person, place, and time. He appears well-developed and well-nourished.  HENT:  Head: Normocephalic and atraumatic.  Neck: Normal range of motion. Neck supple. No JVD present.  Cardiovascular: Normal rate and regular rhythm.  Pulmonary/Chest: Effort normal. No respiratory distress. He has no wheezes. He has no rales.  Abdominal: Soft. He exhibits no distension. There is no tenderness.  Musculoskeletal: He exhibits edema (2+  pitting edema in bilateral lower legs). He exhibits no tenderness.  Neurological: He is alert and oriented to person, place, and time.  Skin: Skin is warm and dry.  Psychiatric: He has a normal mood and affect. His behavior is normal. Thought content normal.  Nursing note and vitals reviewed.  Assessment & Plan:  1: Chronic heart failure with reduced ejection fraction- - NYHA class III - fluid status stable today - weighing daily; reminded to call for an overnight weight gain of >2 pounds overnight or >5 pounds overnight - not adding salt and his wife is reading food labels. Reviewed the importance of closely following a 2000mg  sodium diet  - receiving PT 2 X/week - saw cardiology ([name]) on 08/24/17 -  - BNP from 04/17/17 was 2081.0 - drinking ~40 ounces of fluid daily  2: Atrial fibrillation- - on amiodarone, aspirin, metoprolol and warfarin  3: Lymphedema- - stage 2 - does elevate his legs but edema persists - tried wearing compression socks but they made his legs hurt - wife has been wrapping legs which has helped with edema - limited in exercise due to shortness of breath - will place referral for compression boots - saw PCP [name]) 08/07/17 - BMP from 09/04/17 reviewed and showed sodium 136, potassium 4.0 and GFR >60  Medication bottles were reviewed.

## 2017-09-06 ENCOUNTER — Ambulatory Visit: Payer: Medicare Other | Admitting: Family

## 2017-10-09 DIAGNOSIS — F0150 Vascular dementia without behavioral disturbance: Secondary | ICD-10-CM | POA: Insufficient documentation

## 2017-11-10 ENCOUNTER — Other Ambulatory Visit: Payer: Self-pay

## 2017-11-10 ENCOUNTER — Observation Stay
Admission: EM | Admit: 2017-11-10 | Discharge: 2017-11-11 | Disposition: A | Discharge status: Home / Self Care | Payer: Medicare Other | Attending: Internal Medicine | Admitting: Internal Medicine

## 2017-11-10 DIAGNOSIS — I110 Hypertensive heart disease with heart failure: Secondary | ICD-10-CM | POA: Insufficient documentation

## 2017-11-10 DIAGNOSIS — R55 Syncope and collapse: Principal | ICD-10-CM | POA: Insufficient documentation

## 2017-11-10 DIAGNOSIS — Z66 Do not resuscitate: Secondary | ICD-10-CM | POA: Insufficient documentation

## 2017-11-10 DIAGNOSIS — Z951 Presence of aortocoronary bypass graft: Secondary | ICD-10-CM | POA: Insufficient documentation

## 2017-11-10 DIAGNOSIS — J449 Chronic obstructive pulmonary disease, unspecified: Secondary | ICD-10-CM | POA: Insufficient documentation

## 2017-11-10 DIAGNOSIS — Z9889 Other specified postprocedural states: Secondary | ICD-10-CM | POA: Insufficient documentation

## 2017-11-10 DIAGNOSIS — Z8249 Family history of ischemic heart disease and other diseases of the circulatory system: Secondary | ICD-10-CM | POA: Diagnosis not present

## 2017-11-10 DIAGNOSIS — R001 Bradycardia, unspecified: Secondary | ICD-10-CM | POA: Diagnosis not present

## 2017-11-10 DIAGNOSIS — Z888 Allergy status to other drugs, medicaments and biological substances status: Secondary | ICD-10-CM | POA: Insufficient documentation

## 2017-11-10 DIAGNOSIS — Z7901 Long term (current) use of anticoagulants: Secondary | ICD-10-CM | POA: Diagnosis not present

## 2017-11-10 DIAGNOSIS — K219 Gastro-esophageal reflux disease without esophagitis: Secondary | ICD-10-CM | POA: Diagnosis not present

## 2017-11-10 DIAGNOSIS — I5042 Chronic combined systolic (congestive) and diastolic (congestive) heart failure: Secondary | ICD-10-CM | POA: Insufficient documentation

## 2017-11-10 DIAGNOSIS — F0390 Unspecified dementia without behavioral disturbance: Secondary | ICD-10-CM | POA: Diagnosis not present

## 2017-11-10 DIAGNOSIS — Z7982 Long term (current) use of aspirin: Secondary | ICD-10-CM | POA: Insufficient documentation

## 2017-11-10 DIAGNOSIS — I482 Chronic atrial fibrillation: Secondary | ICD-10-CM | POA: Diagnosis not present

## 2017-11-10 DIAGNOSIS — I350 Nonrheumatic aortic (valve) stenosis: Secondary | ICD-10-CM | POA: Diagnosis not present

## 2017-11-10 DIAGNOSIS — Z955 Presence of coronary angioplasty implant and graft: Secondary | ICD-10-CM | POA: Insufficient documentation

## 2017-11-10 DIAGNOSIS — Z79899 Other long term (current) drug therapy: Secondary | ICD-10-CM | POA: Insufficient documentation

## 2017-11-10 LAB — MAGNESIUM: Magnesium: 2.2 mg/dL (ref 1.7–2.4)

## 2017-11-10 LAB — TROPONIN I
Troponin I: 0.04 ng/mL (ref <0.03)
Troponin I: 0.04 ng/mL (ref <0.03)

## 2017-11-10 LAB — CBC
HCT: 39 % — ABNORMAL LOW (ref 40.0–52.0)
Hemoglobin: 13.3 g/dL (ref 13.0–18.0)
MCH: 32.3 pg (ref 26.0–34.0)
MCHC: 34.1 g/dL (ref 32.0–36.0)
MCV: 94.7 fL (ref 80.0–100.0)
PLATELETS: 137 10*3/uL — AB (ref 150–440)
RBC: 4.12 MIL/uL — ABNORMAL LOW (ref 4.40–5.90)
RDW: 16.9 % — AB (ref 11.5–14.5)
WBC: 4.5 10*3/uL (ref 3.8–10.6)

## 2017-11-10 LAB — BASIC METABOLIC PANEL
ANION GAP: 9 (ref 5–15)
BUN: 27 mg/dL — ABNORMAL HIGH (ref 8–23)
CALCIUM: 8.2 mg/dL — AB (ref 8.9–10.3)
CO2: 26 mmol/L (ref 22–32)
CREATININE: 1.2 mg/dL (ref 0.61–1.24)
Chloride: 102 mmol/L (ref 98–111)
GFR, EST NON AFRICAN AMERICAN: 57 mL/min — AB (ref >60)
GLUCOSE: 88 mg/dL (ref 70–99)
Potassium: 5.2 mmol/L — ABNORMAL HIGH (ref 3.5–5.1)
Sodium: 137 mmol/L (ref 135–145)

## 2017-11-10 LAB — PROTIME-INR
INR: 2.62
PROTHROMBIN TIME: 27.8 s — AB (ref 11.4–15.2)

## 2017-11-10 LAB — TSH: TSH: 1.381 u[IU]/mL (ref 0.350–4.500)

## 2017-11-10 MED ORDER — DEXTROSE 50 % IV SOLN: 50.0000 mL | Freq: Once | INTRAVENOUS | Status: DC

## 2017-11-10 MED ORDER — PRAVASTATIN SODIUM 40 MG PO TABS
40.0000 mg | ORAL_TABLET | Freq: Every day | ORAL | Status: DC
  Administered 2017-11-10: 40 mg via ORAL
  Filled 2017-11-10: qty 1

## 2017-11-10 MED ORDER — ONDANSETRON HCL 4 MG/2ML IJ SOLN: 4.0000 mg | Freq: Four times a day (QID) | INTRAMUSCULAR | Status: DC | PRN

## 2017-11-10 MED ORDER — SPIRONOLACTONE 25 MG PO TABS
25.0000 mg | ORAL_TABLET | Freq: Every evening | ORAL | Status: DC
  Administered 2017-11-11: 25 mg via ORAL
  Filled 2017-11-10: qty 1

## 2017-11-10 MED ORDER — MELATONIN 5 MG PO TABS
2.5000 mg | ORAL_TABLET | Freq: Every day | ORAL | Status: DC
  Administered 2017-11-10: 2.5 mg via ORAL
  Filled 2017-11-10 (×2): qty 0.5

## 2017-11-10 MED ORDER — FLUOXETINE HCL 20 MG PO CAPS
20.0000 mg | ORAL_CAPSULE | Freq: Every day | ORAL | Status: DC
  Administered 2017-11-11: 20 mg via ORAL
  Filled 2017-11-10: qty 1

## 2017-11-10 MED ORDER — WARFARIN - PHYSICIAN DOSING INPATIENT: Freq: Every day | Status: DC

## 2017-11-10 MED ORDER — WARFARIN SODIUM 3 MG PO TABS
3.0000 mg | ORAL_TABLET | Freq: Every day | ORAL | Status: DC
  Administered 2017-11-10: 3 mg via ORAL
  Filled 2017-11-10 (×2): qty 1

## 2017-11-10 MED ORDER — FUROSEMIDE 40 MG PO TABS
40.0000 mg | ORAL_TABLET | Freq: Two times a day (BID) | ORAL | Status: DC
  Administered 2017-11-10: 40 mg via ORAL
  Filled 2017-11-10: qty 1

## 2017-11-10 MED ORDER — SODIUM BICARBONATE 8.4 % IV SOLN: 50.0000 meq | Freq: Once | INTRAVENOUS | Status: DC

## 2017-11-10 MED ORDER — ALBUTEROL SULFATE (2.5 MG/3ML) 0.083% IN NEBU: 2.5000 mg | INHALATION_SOLUTION | RESPIRATORY_TRACT | Status: DC | PRN

## 2017-11-10 MED ORDER — PANTOPRAZOLE SODIUM 40 MG PO TBEC
40.0000 mg | DELAYED_RELEASE_TABLET | Freq: Every day | ORAL | Status: DC
  Administered 2017-11-11: 40 mg via ORAL
  Filled 2017-11-10: qty 1

## 2017-11-10 MED ORDER — INSULIN ASPART 100 UNIT/ML ~~LOC~~ SOLN: 7.0000 [IU] | Freq: Once | SUBCUTANEOUS | Status: DC

## 2017-11-10 MED ORDER — NITROGLYCERIN 0.4 MG SL SUBL: 0.4000 mg | SUBLINGUAL_TABLET | SUBLINGUAL | Status: DC | PRN

## 2017-11-10 MED ORDER — SODIUM CHLORIDE 0.9% FLUSH
3.0000 mL | Freq: Two times a day (BID) | INTRAVENOUS | Status: DC
  Administered 2017-11-10 – 2017-11-11 (×2): 3 mL via INTRAVENOUS

## 2017-11-10 MED ORDER — AMIODARONE HCL 200 MG PO TABS
200.0000 mg | ORAL_TABLET | Freq: Every day | ORAL | Status: DC
  Administered 2017-11-11: 200 mg via ORAL
  Filled 2017-11-10: qty 1

## 2017-11-10 MED ORDER — ASPIRIN EC 81 MG PO TBEC
81.0000 mg | DELAYED_RELEASE_TABLET | Freq: Every day | ORAL | Status: DC
  Administered 2017-11-11: 81 mg via ORAL
  Filled 2017-11-10: qty 1

## 2017-11-10 MED ORDER — POLYETHYLENE GLYCOL 3350 17 G PO PACK: 17.0000 g | PACK | Freq: Every day | ORAL | Status: DC | PRN

## 2017-11-10 MED ORDER — ACETAMINOPHEN 325 MG PO TABS: 650.0000 mg | ORAL_TABLET | Freq: Four times a day (QID) | ORAL | Status: DC | PRN

## 2017-11-10 MED ORDER — ACETAMINOPHEN 650 MG RE SUPP: 650.0000 mg | Freq: Four times a day (QID) | RECTAL | Status: DC | PRN

## 2017-11-10 MED ORDER — ONDANSETRON HCL 4 MG PO TABS: 4.0000 mg | ORAL_TABLET | Freq: Four times a day (QID) | ORAL | Status: DC | PRN

## 2017-11-10 NOTE — Progress Notes (Signed)
Chaplain responded to an OR for an AD. Pt was starting dinner and did not want information just yet. He asked if Chaplain can come back tomorrow.    11/10/17 1900  Clinical Encounter Type  Visited With Patient and family together  Visit Type Initial  Referral From Nurse  Spiritual Encounters  Spiritual Needs Brochure

## 2017-11-10 NOTE — ED Provider Notes (Signed)
[facility] Emergency Department Provider Note  ____________________________________________  Time seen: Approximately 12:32 PM  I have reviewed the triage vital signs and the nursing notes.   HISTORY  Chief Complaint Near Syncope   HPI Kyle Gregory is a 77 y.o. male with a history of porcine aortic valve replacement, CAD, CHF, A. fib on Coumadin, COPD who presents for evaluation of a syncopal episode.  Patient was getting into the car with his daughter and was sitting on the seat when he is eyes rolled back and he lost consciousness.  The family assisted him to the ground and called 911.  Patient immediately regained consciousness after a few seconds.  Patient has no complaints.  He did not sustain any trauma.  Patient denies feeling dizzy before this episode.  Patient denies headache, chest pain, palpitations, shortness of breath, abdominal pain, back pain.  No recent illness.  Per EMS patient's heart rate was in the 30s and atrial fibrillation when they arrived.  His blood pressures in the low 100s which per his wife is his baseline.  Patient has had several episodes of syncope over the last few months.  Past Medical History:  Diagnosis Date  . Anemia   . Aortic stenosis   . Arrhythmia   . Atrial fibrillation ([place])   . Cardiomyopathy ([place])   . CHF (congestive heart failure) ([place])   . COPD (chronic obstructive pulmonary disease) ([place])   . Heartburn     Patient Active Problem List   Diagnosis Date Noted  . Syncope 09/03/2017  . Pressure injury of skin 09/03/2017  . Chronic systolic heart failure ([place]) 05/04/2017  . Lymphedema 05/04/2017  . Morbid obesity with body mass index of 40.0-49.9 ([place]) 07/12/2016  . Aortic stenosis, mild 07/11/2016  . 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 Aortic atherosclerosis ([place]) 06/12/2016  . B12 deficiency 06/12/2016  . Iron deficiency anemia due to chronic blood loss 06/12/2016  . Atrial fibrillation, chronic  ([place]) 06/09/2016    Past Surgical History:  Procedure Laterality Date  . 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 CYSTOSCOPY W/ RETROGRADES Bilateral 08/14/2016   Procedure: CYSTOSCOPY WITH RETROGRADE PYELOGRAM;  Surgeon: Hollice Espy, MD;  Location: [facility] ORS;  Service: Urology;  Laterality: Bilateral;  . LEFT HEART CATH AND CORONARY ANGIOGRAPHY Left 09/05/2016   Procedure: Left Heart Cath and Coronary Angiography;  Surgeon: Teodoro Spray, MD;  Location: [place] [facility];  Service: Cardiovascular;  Laterality: Left;  . neck growth removal     1970's  . TONSILLECTOMY    . TRANSURETHRAL RESECTION OF BLADDER TUMOR N/A 08/14/2016   Procedure: TRANSURETHRAL RESECTION OF BLADDER TUMOR (TURBT);  Surgeon: Hollice Espy, MD;  Location: [facility] ORS;  Service: Urology;  Laterality: N/A;    Prior to Admission medications   Medication Sig Start Date End Date Taking? Authorizing Provider  amiodarone (PACERONE) 200 MG tablet Take 200 mg by mouth daily.    Yes [provider]  aspirin (ASPIRIN 81) 81 MG EC tablet Take 1 tablet by mouth daily.    Yes [provider]  Calcium Carbonate-Vitamin D (OYSTER SHELL CALCIUM 500 + D) 500-125 MG-UNIT TABS Take 1 tablet by mouth daily.    Yes [provider]  ferrous sulfate 325 (65 FE) MG tablet Take 325 mg by mouth 2 (two) times daily with a meal.   Yes [provider]  FLUoxetine (PROZAC) 20 MG capsule Take 20 mg by  mouth daily.    Yes [provider]  furosemide (LASIX) 40 MG tablet Take 1 tablet (40 mg total) by mouth 2 (two) times daily. 04/19/17  Yes Dustin Flock, MD  Melatonin 3 MG TABS Take 1 tablet by mouth at bedtime.   Yes [provider]  metoprolol tartrate (LOPRESSOR) 25 MG tablet Take 12.5 mg by mouth 2 (two) times daily.    Yes [provider]  nitroGLYCERIN (NITROSTAT) 0.4 MG SL tablet Place 0.4 mg under the tongue every 5 (five) minutes as  needed for chest pain (Up to 3 doses).  02/22/17 02/22/18 Yes [provider]  omeprazole (PRILOSEC) 20 MG capsule Take 20 mg by mouth daily as needed (Heartburn or acid reflux).    Yes [provider]  pravastatin (PRAVACHOL) 40 MG tablet Take 1 tablet by mouth at bedtime.  04/13/17  Yes [provider]  spironolactone (ALDACTONE) 25 MG tablet Take 1 tablet by mouth daily. 05/21/17 05/21/18 Yes [provider]  warfarin (COUMADIN) 2 MG tablet Take 3 mg by mouth at bedtime.    Yes [provider]    Allergies Donepezil  Family History  Problem Relation Age of Onset  . Heart failure Father   . Heart failure Brother   . Atrial fibrillation Sister   . Prostate cancer Neg Hx   . Kidney cancer Neg Hx   . Bladder Cancer Neg Hx     Social History Social History   Tobacco Use  . Smoking status: Never Smoker  . Smokeless tobacco: Never Used  Substance Use Topics  . Alcohol use: No  . Drug use: No    Review of Systems  Constitutional: Negative for fever. + syncope Eyes: Negative for visual changes. ENT: Negative for sore throat. Neck: No neck pain  Cardiovascular: Negative for chest pain. Respiratory: Negative for shortness of breath. Gastrointestinal: Negative for abdominal pain, vomiting or diarrhea. Genitourinary: Negative for dysuria. Musculoskeletal: Negative for back pain. Skin: Negative for rash. Neurological: Negative for headaches, weakness or numbness. Psych: No SI or HI  ____________________________________________   PHYSICAL EXAM:  VITAL SIGNS: ED Triage Vitals  Enc Vitals Group     BP 11/10/17 1224 107/69     Pulse Rate 11/10/17 1224 78     Resp 11/10/17 1224 18     Temp 11/10/17 1224 98.1 F (36.7 C)     Temp Source 11/10/17 1224 Oral     SpO2 11/10/17 1224 98 %     Weight --      Height --      Head Circumference --      Peak Flow --      Pain Score 11/10/17 1225 0     Pain Loc --      Pain Edu? --       Excl. in [place]? --     Constitutional: Alert and oriented. Well appearing and in no apparent distress. HEENT:      Head: Normocephalic and atraumatic.         Eyes: Conjunctivae are normal. Sclera is non-icteric.       Mouth/Throat: Mucous membranes are moist.       Neck: Supple with no signs of meningismus. Cardiovascular: Irregularly irregular rhythm with bradycardic rate. No murmurs, gallops, or rubs. 2+ symmetrical distal pulses are present in all extremities. No JVD. Respiratory: Normal respiratory effort. Lungs are clear to auscultation bilaterally. No wheezes, crackles, or rhonchi.  Gastrointestinal: Soft, non tender, and non distended with positive bowel  sounds. No rebound or guarding. Musculoskeletal: Erythema on bilateral lower extremities with no warmth and no tenderness, trace pitting edema bilaterally.  Neurologic: Normal speech and language. Face is symmetric. Moving all extremities. No gross focal neurologic deficits are appreciated. Skin: Skin is warm, dry and intact. No rash noted. Psychiatric: Mood and affect are normal. Speech and behavior are normal.  ____________________________________________   LABS (all labs ordered are listed, but only abnormal results are displayed)  Labs Reviewed  CBC - Abnormal; Notable for the following components:      Result Value   RBC 4.12 (*)    HCT 39.0 (*)    RDW 16.9 (*)    Platelets 137 (*)    All other components within normal limits  BASIC METABOLIC PANEL - Abnormal; Notable for the following components:   Potassium 5.2 (*)    BUN 27 (*)    Calcium 8.2 (*)    GFR calc non Af Amer 57 (*)    All other components within normal limits  TROPONIN I - Abnormal; Notable for the following components:   Troponin I 0.04 (*)    All other components within normal limits  PROTIME-INR - Abnormal; Notable for the following components:   Prothrombin Time 27.8 (*)    All other components within normal limits  TROPONIN I - Abnormal; Notable  for the following components:   Troponin I 0.04 (*)    All other components within normal limits  MAGNESIUM   ____________________________________________  EKG  ED ECG REPORT I, Rudene Re, the attending physician, personally viewed and interpreted this ECG.  Atrial fibrillation, rate of 70, left bundle branch block, prolonged QTC, normal axis, no ST elevations or depressions.  Unchanged from prior. ____________________________________________  RADIOLOGY  none  ____________________________________________   PROCEDURES  Procedure(s) performed: None Procedures Critical Care performed:  None ____________________________________________   INITIAL IMPRESSION / ASSESSMENT AND PLAN / ED COURSE   78 y.o. male with a history of porcine aortic valve replacement, CAD, CHF, A. fib on Coumadin, COPD who presents for evaluation of a syncopal episode.  Patient with no pre-or post syncopal complaints.  He is well-appearing, he is in A. fib with rate in the 50s and 60s, blood pressure is 107/69 which per wife seems to be patient's baseline.  EKG shows no evidence of ischemia.  Differential diagnoses including heat exhaustion since patient was on the 90 degree weather versus cardiac syncope versus vasovagal versus symptomatic bradycardia.  Will monitor closely on cardiac telemetry.  Will discuss with patient's cardiologist.  Will check basic labs to rule out dehydration, anemia, AKI, electrolyte abnormalities.    _________________________ 4:40 PM on 11/10/2017 -----------------------------------------  Labs showing no acute findings.  Patient continues to have episodes of bradycardia with rate in the 40s while in A. fib.  Patient will be admitted for telemetry monitoring for high risk syncope and reduction of his beta-blocker to see if that will improve his rate.   As part of my medical decision making, I reviewed the following data within the [place] History obtained  from family, Nursing notes reviewed and incorporated, Labs reviewed , EKG interpreted , Old EKG reviewed, Old chart reviewed, Discussed with admitting physician  Notes from prior ED visits and [place] Controlled Substance Database    Pertinent labs & imaging results that were available during my care of the patient were reviewed by me and considered in my medical decision making (see chart for details).    ____________________________________________   FINAL  CLINICAL IMPRESSION(S) / ED DIAGNOSES  Final diagnoses:  Syncope, unspecified syncope type  Bradycardia      NEW MEDICATIONS STARTED DURING THIS VISIT:  ED Discharge Orders    None       Note:  This document was prepared using Dragon voice recognition software and may include unintentional dictation errors.    Alfred Levins, [place], MD 11/10/17 [phone]

## 2017-11-10 NOTE — ED Triage Notes (Signed)
Per ems pt had syncopal episode walking out of [employer] back to car. Sat on the curb and per family to ems passed out. Pt denies passing out.

## 2017-11-10 NOTE — ED Notes (Signed)
Report to [place]

## 2017-11-10 NOTE — H&P (Signed)
[place] [facility] [place] NAME: Kyle Gregory    MR#:  [id number]  DATE OF BIRTH:  1940-11-17  DATE OF ADMISSION:  11/10/2017  PRIMARY CARE PHYSICIAN: Rusty Aus, MD   REQUESTING/REFERRING PHYSICIAN: Dr. Alfred Levins  CHIEF COMPLAINT:   Chief Complaint  Patient presents with  . Near Syncope    HISTORY OF PRESENT ILLNESS:  Kyle Gregory  is a 77 y.o. male with a known history of atrial fibrillation on amiodarone, metoprolol, warfarin, dementia, hypertension presents to the emergency room brought in through EMS after he had episode of syncope while getting into a car with his daughter.  EMS found his heart rate to be in the 30s and brought to the emergency room.  Here patient's heart rate has been as low as 40s.  Asymptomatic.  Is being admitted for overnight monitoring/observation due to bradycardia and syncope.  Patient continues to be in atrial fibrillation.  Had similar presentation in May 2019 when new medication Aricept was thought to be the cause for bradycardia and stopped. Patient is a poor historian.  PAST MEDICAL HISTORY:   Past Medical History:  Diagnosis Date  . Anemia   . Aortic stenosis   . Arrhythmia   . Atrial fibrillation ([place])   . Cardiomyopathy ([place])   . CHF (congestive heart failure) ([place])   . COPD (chronic obstructive pulmonary disease) ([place])   . Heartburn     PAST SURGICAL HISTORY:   Past Surgical History:  Procedure Laterality Date  . 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 CYSTOSCOPY W/ RETROGRADES Bilateral 08/14/2016   Procedure: CYSTOSCOPY WITH RETROGRADE PYELOGRAM;  Surgeon: Hollice Espy, MD;  Location: [facility] ORS;  Service: Urology;  Laterality: Bilateral;  . LEFT HEART CATH AND CORONARY ANGIOGRAPHY Left 09/05/2016   Procedure: Left Heart Cath and Coronary Angiography;  Surgeon: Teodoro Spray, MD;  Location: [place] [facility];  Service: Cardiovascular;  Laterality:  Left;  . neck growth removal     1970's  . TONSILLECTOMY    . TRANSURETHRAL RESECTION OF BLADDER TUMOR N/A 08/14/2016   Procedure: TRANSURETHRAL RESECTION OF BLADDER TUMOR (TURBT);  Surgeon: Hollice Espy, MD;  Location: [facility] ORS;  Service: Urology;  Laterality: N/A;    SOCIAL HISTORY:   Social History   Tobacco Use  . Smoking status: Never Smoker  . Smokeless tobacco: Never Used  Substance Use Topics  . Alcohol use: No    FAMILY HISTORY:   Family History  Problem Relation Age of Onset  . Heart failure Father   . Heart failure Brother   . Atrial fibrillation Sister   . Prostate cancer Neg Hx   . Kidney cancer Neg Hx   . Bladder Cancer Neg Hx     DRUG ALLERGIES:   Allergies  Allergen Reactions  . Donepezil Palpitations    Severe bradycardia    REVIEW OF SYSTEMS:   Review of Systems  Unable to perform ROS: Dementia    MEDICATIONS AT HOME:   Prior to Admission medications   Medication Sig Start Date End Date Taking? Authorizing Provider  amiodarone (PACERONE) 200 MG tablet Take 200 mg by mouth daily.    Yes [provider]  aspirin (ASPIRIN 81) 81 MG EC tablet Take 1 tablet by mouth daily.    Yes [provider]  Calcium Carbonate-Vitamin D (OYSTER SHELL CALCIUM 500 + D) 500-125 MG-UNIT TABS Take 1 tablet by mouth daily.  Yes [provider]  ferrous sulfate 325 (65 FE) MG tablet Take 325 mg by mouth 2 (two) times daily with a meal.   Yes [provider]  FLUoxetine (PROZAC) 20 MG capsule Take 20 mg by mouth daily.    Yes [provider]  furosemide (LASIX) 40 MG tablet Take 1 tablet (40 mg total) by mouth 2 (two) times daily. 04/19/17  Yes Dustin Flock, MD  Melatonin 3 MG TABS Take 1 tablet by mouth at bedtime.   Yes [provider]  metoprolol tartrate (LOPRESSOR) 25 MG tablet Take 12.5 mg by mouth 2 (two) times daily.    Yes [provider]  nitroGLYCERIN (NITROSTAT) 0.4 MG SL tablet Place 0.4  mg under the tongue every 5 (five) minutes as needed for chest pain (Up to 3 doses).  02/22/17 02/22/18 Yes [provider]  omeprazole (PRILOSEC) 20 MG capsule Take 20 mg by mouth daily as needed (Heartburn or acid reflux).    Yes [provider]  pravastatin (PRAVACHOL) 40 MG tablet Take 1 tablet by mouth at bedtime.  04/13/17  Yes [provider]  spironolactone (ALDACTONE) 25 MG tablet Take 1 tablet by mouth daily. 05/21/17 05/21/18 Yes [provider]  warfarin (COUMADIN) 2 MG tablet Take 3 mg by mouth at bedtime.    Yes [provider]     VITAL SIGNS:  Blood pressure 110/63, pulse 78, temperature 98.1 F (36.7 C), temperature source Oral, resp. rate 16, SpO2 98 %.  PHYSICAL EXAMINATION:  Physical Exam  GENERAL:  77 y.o.-year-old patient lying in the bed with no acute distress.  EYES: Pupils equal, round, reactive to light and accommodation. No scleral icterus. Extraocular muscles intact.  HEENT: Head atraumatic, normocephalic. Oropharynx and nasopharynx clear. No oropharyngeal erythema, moist oral mucosa  NECK:  Supple, no jugular venous distention. No thyroid enlargement, no tenderness.  LUNGS: Normal breath sounds bilaterally, no wheezing, rales, rhonchi. No use of accessory muscles of respiration.  CARDIOVASCULAR: Irregularly irregular ABDOMEN: Soft, nontender, nondistended. Bowel sounds present. No organomegaly or mass.  EXTREMITIES: No pedal edema, cyanosis, or clubbing. + 2 pedal & radial pulses b/l.   NEUROLOGIC: Cranial nerves II through XII are intact. No focal Motor or sensory deficits appreciated b/l PSYCHIATRIC: The patient is alert and awake.  Pleasant SKIN: No obvious rash, lesion, or ulcer.   LABORATORY PANEL:   CBC Recent Labs  Lab 11/10/17 1233  WBC 4.5  HGB 13.3  HCT 39.0*  PLT 137*   ------------------------------------------------------------------------------------------------------------------  Chemistries   Recent Labs  Lab 11/10/17 1233  NA 137  K 5.2*  CL 102  CO2 26  GLUCOSE 88  BUN 27*  CREATININE 1.20  CALCIUM 8.2*  MG 2.2   ------------------------------------------------------------------------------------------------------------------  Cardiac Enzymes Recent Labs  Lab 11/10/17 1552  TROPONINI 0.04*   ------------------------------------------------------------------------------------------------------------------  RADIOLOGY:  No results found.   IMPRESSION AND PLAN:   *Syncope due to bradycardia with atrial fibrillation.   This is patient's second episode.  During last admission his Aricept was stopped.  Will stop his metoprolol at this time.  But continue amiodarone. Check TSH.   Continue Coumadin  *Chronic diastolic congestive heart failure is stable.   Has chronic lower committee edema which is unchanged  * COPD.  No wheezing.  Nebulizers as needed  DVT prophylaxis.  On Coumadin per  All the records are reviewed and case discussed with ED provider. Management plans discussed with the patient, family and they are in agreement.  CODE STATUS:  DO NOT RESUSCITATE  TOTAL TIME TAKING CARE OF THIS PATIENT: 35 minutes.   Neita Carp M.D on 11/10/2017 at 5:05 PM  Between 7am to 6pm - Pager - [phone]  After 6pm go to [email or web address] - password [email or web address] [facility]  Office  [phone]  CC: Primary care physician; Rusty Aus, MD  Note: This dictation was prepared with Dragon dictation along with smaller phrase technology. Any transcriptional errors that result from this process are unintentional.

## 2017-11-10 NOTE — Progress Notes (Signed)
Advance care planning  Purpose of Encounter Discussed regarding acute hospitalization/prognosis and CODE STATUS discussion  Parties in Attendance Patient and wife at bedside  Patients Decisional capacity Patient has mild dementia and needs help from wife to make decisions  Discussed regarding patient's bradycardia and plan of care with observation admission.  All questions answered and prognosis discussed  Discussed regarding CODE STATUS.  Patient does not have a documented healthcare power of attorney.  He did have discussion with his primary care physician in the past when he was asked if he would want to be on artificial life support or have CPR if his heart and lung stop or if he wants to go to heaven to [place].  Patient mentions he want to go to heaven to [place].  Today I asked questions regarding intubation and CPR again.  Patient chose to be DO NOT RESUSCITATE and not intubate.  Wife at bedside agrees. Orders entered  Time spent 18 minutes

## 2017-11-11 DIAGNOSIS — R55 Syncope and collapse: Secondary | ICD-10-CM | POA: Diagnosis not present

## 2017-11-11 LAB — BASIC METABOLIC PANEL
Anion gap: 8 (ref 5–15)
BUN: 26 mg/dL — AB (ref 8–23)
CO2: 29 mmol/L (ref 22–32)
CREATININE: 1.27 mg/dL — AB (ref 0.61–1.24)
Calcium: 8.7 mg/dL — ABNORMAL LOW (ref 8.9–10.3)
Chloride: 102 mmol/L (ref 98–111)
GFR calc Af Amer: >60 mL/min (ref >60)
GFR calc non Af Amer: 53 mL/min — ABNORMAL LOW (ref >60)
GLUCOSE: 91 mg/dL (ref 70–99)
Potassium: 4.1 mmol/L (ref 3.5–5.1)
Sodium: 139 mmol/L (ref 135–145)

## 2017-11-11 LAB — PROTIME-INR
INR: 2.38
PROTHROMBIN TIME: 25.8 s — AB (ref 11.4–15.2)

## 2017-11-11 LAB — CBC
HEMATOCRIT: 39.1 % — AB (ref 40.0–52.0)
Hemoglobin: 13.2 g/dL (ref 13.0–18.0)
MCH: 31.8 pg (ref 26.0–34.0)
MCHC: 33.7 g/dL (ref 32.0–36.0)
MCV: 94.4 fL (ref 80.0–100.0)
PLATELETS: 136 10*3/uL — AB (ref 150–440)
RBC: 4.14 MIL/uL — AB (ref 4.40–5.90)
RDW: 16.7 % — ABNORMAL HIGH (ref 11.5–14.5)
WBC: 4.3 10*3/uL (ref 3.8–10.6)

## 2017-11-11 MED ORDER — FUROSEMIDE 40 MG PO TABS: 40.0000 mg | ORAL_TABLET | Freq: Every day | ORAL | Status: DC

## 2017-11-11 MED ORDER — FUROSEMIDE 40 MG PO TABS: 40.0000 mg | ORAL_TABLET | Freq: Every day | ORAL | 0 refills | Status: DC

## 2017-11-11 NOTE — Plan of Care (Signed)
  Problem: Education: Goal: Knowledge of General Education information will improve Outcome: Progressing   Problem: Health Behavior/Discharge Planning: Goal: Ability to manage health-related needs will improve Outcome: Progressing   Problem: Safety: Goal: Ability to remain free from injury will improve Outcome: Progressing   

## 2017-11-11 NOTE — Progress Notes (Signed)
Pt ambulated around nursing station x2 no complaints, HR remains 60's-70's.

## 2017-11-11 NOTE — Progress Notes (Signed)
Pt discharged home today with family. Follow up appt made, and medication regimen updated, family educated.

## 2017-11-11 NOTE — Plan of Care (Signed)
  Problem: Clinical Measurements: Goal: Ability to maintain clinical measurements within normal limits will improve Outcome: Adequate for Discharge   Problem: Clinical Measurements: Goal: Cardiovascular complication will be avoided Outcome: Adequate for Discharge

## 2017-11-11 NOTE — Discharge Summary (Signed)
[facility] NAME: Kyle Gregory    MR#:  [id number]  DATE OF BIRTH:  1941/01/31  DATE OF ADMISSION:  11/10/2017 ADMITTING PHYSICIAN: Hillary Bow, MD  DATE OF DISCHARGE: 11/11/2017  PRIMARY CARE PHYSICIAN: Rusty Aus, MD    ADMISSION DIAGNOSIS:  Bradycardia [R00.1] Syncope, unspecified syncope type [R55]  DISCHARGE DIAGNOSIS:  syncope due to sinus bradycardia history of atrial fibrillation on Coumadin  SECONDARY DIAGNOSIS:   Past Medical History:  Diagnosis Date  . Anemia   . Aortic stenosis   . Arrhythmia   . Atrial fibrillation ([place])   . Cardiomyopathy ([place])   . CHF (congestive heart failure) ([place])   . COPD (chronic obstructive pulmonary disease) ([place])   . Heartburn     HOSPITAL COURSE:  Francisco Ostrovsky  is a 77 y.o. male with a known history of atrial fibrillation on amiodarone, metoprolol, warfarin, dementia, hypertension presents to the emergency room brought in through EMS after he had episode of syncope while getting into a car with his daughter.  EMS found his heart rate to be in the 30s and brought to the emergency room.  Here patient's heart rate has been as low as 40s.    * Syncope due to bradycardia with atrial fibrillation.   -now 55 to 70 -blood pressure stable. This is patient's second episode.  During last admission his Aricept was stopped.  Will stop his metoprolol at this time.  But continue amiodarone.   Continue Coumadin  *Chronic diastolic congestive heart failure is stable.   -pt does not seem to be volume overloaded. I will decrease his Lasix to 40 mg daily. His creatinine was 1.27.  * COPD.  No wheezing.  Nebulizers as needed  DVT prophylaxis.  On Coumadin per  Ambulate. Heart rate remain anywhere from 55 to 70 a fib. Patient is symptomatic. He wants to go home. Spoke with patient's daughter in the room. She is agreeable with plan. To follow up with Dr. [name] next week  appointment.  CONSULTS OBTAINED:    DRUG ALLERGIES:   Allergies  Allergen Reactions  . Donepezil Palpitations    Severe bradycardia    DISCHARGE MEDICATIONS:   Allergies as of 11/11/2017      Reactions   Donepezil Palpitations   Severe bradycardia      Medication List    STOP taking these medications   metoprolol tartrate 25 MG tablet Commonly known as:  LOPRESSOR     TAKE these medications   amiodarone 200 MG tablet Commonly known as:  PACERONE Take 200 mg by mouth daily.   ASPIRIN 81 81 MG EC tablet Generic drug:  aspirin Take 1 tablet by mouth daily.   ferrous sulfate 325 (65 FE) MG tablet Take 325 mg by mouth 2 (two) times daily with a meal.   FLUoxetine 20 MG capsule Commonly known as:  PROZAC Take 20 mg by mouth daily.   furosemide 40 MG tablet Commonly known as:  LASIX Take 1 tablet (40 mg total) by mouth daily. What changed:  when to take this   Melatonin 3 MG Tabs Take 1 tablet by mouth at bedtime.   nitroGLYCERIN 0.4 MG SL tablet Commonly known as:  NITROSTAT Place 0.4 mg under the tongue every 5 (five) minutes as needed for chest pain (Up to 3 doses).   omeprazole 20 MG capsule Commonly known as:  PRILOSEC Take 20 mg by mouth daily as needed (Heartburn or acid reflux).  OYSTER SHELL CALCIUM 500 + D 500-125 MG-UNIT Tabs Generic drug:  Calcium Carbonate-Vitamin D Take 1 tablet by mouth daily.   pravastatin 40 MG tablet Commonly known as:  PRAVACHOL Take 1 tablet by mouth at bedtime.   spironolactone 25 MG tablet Commonly known as:  ALDACTONE Take 1 tablet by mouth daily.   warfarin 2 MG tablet Commonly known as:  COUMADIN Take 3 mg by mouth at bedtime.       If you experience worsening of your admission symptoms, develop shortness of breath, life threatening emergency, suicidal or homicidal thoughts you must seek medical attention immediately by calling 911 or calling your MD immediately  if symptoms less severe.  You Must read  complete instructions/literature along with all the possible adverse reactions/side effects for all the Medicines you take and that have been prescribed to you. Take any new Medicines after you have completely understood and accept all the possible adverse reactions/side effects.   Please note  You were cared for by a hospitalist during your hospital stay. If you have any questions about your discharge medications or the care you received while you were in the hospital after you are discharged, you can call the unit and asked to speak with the hospitalist on call if the hospitalist that took care of you is not available. Once you are discharged, your primary care physician will handle any further medical issues. Please note that NO REFILLS for any discharge medications will be authorized once you are discharged, as it is imperative that you return to your primary care physician (or establish a relationship with a primary care physician if you do not have one) for your aftercare needs so that they can reassess your need for medications and monitor your lab values. Today   SUBJECTIVE   Doing Well. Patient denies any complaints.  VITAL SIGNS:  Blood pressure 118/61, pulse (!) 57, temperature 98.4 F (36.9 C), resp. rate 18, height 6' (1.829 m), weight 87.2 kg (192 lb 3.2 oz), SpO2 100 %.  I/O:    Intake/Output Summary (Last 24 hours) at 11/11/2017 1213 Last data filed at 11/11/2017 1031 Gross per 24 hour  Intake 240 ml  Output 850 ml  Net -610 ml    PHYSICAL EXAMINATION:  GENERAL:  77 y.o.-year-old patient lying in the bed with no acute distress.  EYES: Pupils equal, round, reactive to light and accommodation. No scleral icterus. Extraocular muscles intact.  HEENT: Head atraumatic, normocephalic. Oropharynx and nasopharynx clear.  NECK:  Supple, no jugular venous distention. No thyroid enlargement, no tenderness.  LUNGS: Normal breath sounds bilaterally, no wheezing, rales,rhonchi or  crepitation. No use of accessory muscles of respiration.  CARDIOVASCULAR: S1, S2 normal. No murmurs, rubs, or gallops.  ABDOMEN: Soft, non-tender, non-distended. Bowel sounds present. No organomegaly or mass.  EXTREMITIES: No pedal edema, cyanosis, or clubbing.  NEUROLOGIC: Cranial nerves II through XII are intact. Muscle strength 5/5 in all extremities. Sensation intact. Gait not checked.  PSYCHIATRIC: The patient is alert and oriented x 3.  SKIN: No obvious rash, lesion, or ulcer. Old scratch mark and scabs old in the lower extremity.  DATA REVIEW:   CBC  Recent Labs  Lab 11/11/17 0453  WBC 4.3  HGB 13.2  HCT 39.1*  PLT 136*    Chemistries  Recent Labs  Lab 11/10/17 1233 11/11/17 0453  NA 137 139  K 5.2* 4.1  CL 102 102  CO2 26 29  GLUCOSE 88 91  BUN 27* 26*  CREATININE  1.20 1.27*  CALCIUM 8.2* 8.7*  MG 2.2  --     Microbiology Results   No results found for this or any previous visit (from the past 240 hour(s)).  RADIOLOGY:  No results found.   Management plans discussed with the patient, family and they are in agreement.  CODE STATUS:     Code Status Orders  (From admission, onward)        Start     Ordered   11/10/17 1704  Do not attempt resuscitation (DNR)  Continuous    Question Answer Comment  In the event of cardiac or respiratory ARREST Do not call a "code blue"   In the event of cardiac or respiratory ARREST Do not perform Intubation, CPR, defibrillation or ACLS   In the event of cardiac or respiratory ARREST Use medication by any route, position, wound care, and other measures to relive pain and suffering. May use oxygen, suction and manual treatment of airway obstruction as needed for comfort.      11/10/17 1705    Code Status History    Date Active Date Inactive Code Status Order ID Comments User Context   09/03/2017 0617 09/04/2017 1600 Full Code [id number]  Harrie Foreman, MD Inpatient   04/17/2017 1310 04/19/2017 1711 Full Code  [id number]  Epifanio Lesches, MD ED      TOTAL TIME TAKING CARE OF THIS PATIENT: *40* minutes.    Fritzi Mandes M.D on 11/11/2017 at 12:13 PM  Between 7am to 6pm - Pager - [phone] After 6pm go to [email or web address] - password [email or web address] [facility]  Office  [phone]  CC: Primary care physician; Rusty Aus, MD

## 2017-11-30 NOTE — Progress Notes (Signed)
12/03/2017 4:52 PM   Kyle Gregory 01-12-1941 [id number]  Referring provider: Rusty Aus, MD [facility] [place], [place] [address]  Chief Complaint  Patient presents with  . Hematuria    HPI: Patient is a 77 year old Caucasian male with a history of high-grade Ta urothelial carcinoma with CIS diagnosed in 07/2016.  He underwent TURBT on 08/14/2016 with Dr. Erlene Quan for a 3 cm bladder tumor with pathology returning high-grade Ta urothelial carcinoma with CIS.  Induction BCG was recommended which he started May 2018.  He received 3 treatments and due to worsening cardiac status underwent three-vessel CABG and aortic valve replacement at [facility] in June 2018.  He had an extended hospital course complicated by CVA, atrial fibrillation and a sternotomy wound infection.    He was to return in December for a cystoscopy, but he was too ill to make the appointment.    He has been hospitalized several times for heart issues over the last year.    His wife saw blood in his urine two to three weeks ago.  She pushed fluids and the urine turned yellow.  He was started on an antibiotic.  He was also complaining of a few days of mid back pain.  His UA is positive for 6-10 WBC's, > 30 RBC's and moderate bacteria.    PMH: Past Medical History:  Diagnosis Date  . Anemia   . Aortic stenosis   . Arrhythmia   . Atrial fibrillation ([place])   . Cardiomyopathy ([place])   . CHF (congestive heart failure) ([place])   . COPD (chronic obstructive pulmonary disease) ([place])   . Heartburn     Surgical History: Past Surgical History:  Procedure Laterality Date  . 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 CYSTOSCOPY W/ RETROGRADES Bilateral 08/14/2016   Procedure: CYSTOSCOPY WITH RETROGRADE PYELOGRAM;  Surgeon: Hollice Espy, MD;  Location: [facility] ORS;  Service: Urology;  Laterality: Bilateral;  . LEFT HEART CATH AND CORONARY  ANGIOGRAPHY Left 09/05/2016   Procedure: Left Heart Cath and Coronary Angiography;  Surgeon: Teodoro Spray, MD;  Location: [facility];  Service: Cardiovascular;  Laterality: Left;  . neck growth removal     1970's  . TONSILLECTOMY    . TRANSURETHRAL RESECTION OF BLADDER TUMOR N/A 08/14/2016   Procedure: TRANSURETHRAL RESECTION OF BLADDER TUMOR (TURBT);  Surgeon: Hollice Espy, MD;  Location: [facility] ORS;  Service: Urology;  Laterality: N/A;    Home Medications:  Allergies as of 12/03/2017      Reactions   Amiodarone    Other reaction(s): Dizziness   Donepezil Palpitations   Severe bradycardia      Medication List        Accurate as of 12/03/17  4:52 PM. Always use your most recent med list.          amiodarone 200 MG tablet Commonly known as:  PACERONE Take 200 mg by mouth daily.   ASPIRIN 81 81 MG EC tablet Generic drug:  aspirin Take 1 tablet by mouth daily.   [employer] ZINC OXIDE 20 % ointment Generic drug:  zinc oxide Apply 1 application topically as needed for irritation.   donepezil 5 MG tablet Commonly known as:  ARICEPT TAKE 1 TABLET BY MOUTH EVERY DAY AT NIGHT   ferrous sulfate 325 (65 FE) MG tablet Take 325 mg by mouth 2 (two) times daily with a meal.   FLUoxetine 20  MG capsule Commonly known as:  PROZAC Take 20 mg by mouth daily.   furosemide 40 MG tablet Commonly known as:  LASIX Take 1 tablet (40 mg total) by mouth daily.   Melatonin 3 MG Tabs Take 1 tablet by mouth at bedtime.   nitroGLYCERIN 0.4 MG SL tablet Commonly known as:  NITROSTAT Place 0.4 mg under the tongue every 5 (five) minutes as needed for chest pain (Up to 3 doses).   omeprazole 20 MG capsule Commonly known as:  PRILOSEC Take 20 mg by mouth daily as needed (Heartburn or acid reflux).   OYSTER SHELL CALCIUM 500 + D 500-125 MG-UNIT Tabs Generic drug:  Calcium Carbonate-Vitamin D Take 1 tablet by mouth daily.   pravastatin 40 MG tablet Commonly known as:  PRAVACHOL Take 1  tablet by mouth at bedtime.   spironolactone 25 MG tablet Commonly known as:  ALDACTONE Take 1 tablet by mouth daily.   warfarin 2 MG tablet Commonly known as:  COUMADIN Take 3 mg by mouth at bedtime.       Allergies:  Allergies  Allergen Reactions  . Amiodarone     Other reaction(s): Dizziness  . Donepezil Palpitations    Severe bradycardia    Family History: Family History  Problem Relation Age of Onset  . Heart failure Father   . Heart failure Brother   . Atrial fibrillation Sister   . Prostate cancer Neg Hx   . Kidney cancer Neg Hx   . Bladder Cancer Neg Hx     Social History:  reports that he has never smoked. He has never used smokeless tobacco. He reports that he does not drink alcohol or use drugs.  ROS: UROLOGY Frequent Urination?: No Hard to postpone urination?: No Burning/pain with urination?: No Get up at night to urinate?: No Leakage of urine?: No Urine stream starts and stops?: No Trouble starting stream?: No Do you have to strain to urinate?: No Blood in urine?: Yes Urinary tract infection?: No Sexually transmitted disease?: No Injury to kidneys or bladder?: No Painful intercourse?: No Weak stream?: No Erection problems?: No Penile pain?: No  Gastrointestinal Nausea?: No Vomiting?: No Indigestion/heartburn?: No Diarrhea?: No Constipation?: No  Constitutional Fever: No Night sweats?: No Weight loss?: No Fatigue?: No  Skin Skin rash/lesions?: No Itching?: No  Eyes Blurred vision?: No Double vision?: No  Ears/Nose/Throat Sore throat?: No Sinus problems?: No  Hematologic/Lymphatic Swollen glands?: No Easy bruising?: Yes  Cardiovascular Leg swelling?: Yes Chest pain?: No  Respiratory Cough?: No Shortness of breath?: No  Endocrine Excessive thirst?: No  Musculoskeletal Back pain?: No Joint pain?: No  Neurological Headaches?: No Dizziness?: No  Psychologic Depression?: No Anxiety?: No  Physical Exam: BP  106/67 (BP Location: Left Arm, Patient Position: Sitting, Cuff Size: Normal)   Pulse 98   Ht 6' (1.829 m)   Wt 189 lb 6.4 oz (85.9 kg)   BMI 25.69 kg/m   Constitutional: Well nourished. Alert and oriented, No acute distress. HEENT: [place] AT, moist mucus membranes. Trachea midline, no masses. Cardiovascular: No clubbing, cyanosis, or edema. Respiratory: Normal respiratory effort, no increased work of breathing. GI: Abdomen is soft, non tender, non distended, no abdominal masses. Liver and spleen not palpable.  No hernias appreciated.  Stool sample for occult testing is not indicated.   GU: No CVA tenderness.  No bladder fullness or masses.   Skin: No rashes, bruises or suspicious lesions. Lymph: No cervical or inguinal adenopathy. Neurologic: Grossly intact, no focal deficits, moving all 4  extremities. Psychiatric: Normal mood and affect.  Laboratory Data: Lab Results  Component Value Date   WBC 4.3 11/11/2017   HGB 13.2 11/11/2017   HCT 39.1 (L) 11/11/2017   MCV 94.4 11/11/2017   PLT 136 (L) 11/11/2017    Lab Results  Component Value Date   CREATININE 1.27 (H) 11/11/2017   Urinalysis 6-10 WBC's.  > 30 RBC's.    I have reviewed the labs.  Assessment & Plan:    1. High-grade Ta urothelial carcinoma with CIS His last BCG was received in 08/2016  Will need upper tract imaging and cystoscopy  2. Gross hematuria I explained to the patient that a contrast material will be injected into a vein and that in rare instances, an allergic reaction can result and may even life threatening   The patient denies any allergies to contrast, iodine and/or seafood and is not taking metformin. Following the imaging study,  I've recommended a cystoscopy. I described how this is performed, typically in an office setting with a flexible cystoscope. We described the risks, benefits, and possible side effects, the most common of which is a minor amount of blood in the urine and/or burning which  usually resolves in 24 to 48 hours.   The patient will return following all of the above for discussion of the results.  UA Urine culture BUN + creatinine     Zara Council, PA-C  [place] [address] [place], [place] [address] [phone]

## 2017-12-03 ENCOUNTER — Encounter: Payer: Self-pay | Admitting: Urology

## 2017-12-03 ENCOUNTER — Ambulatory Visit (INDEPENDENT_AMBULATORY_CARE_PROVIDER_SITE_OTHER): Payer: Medicare Other | Admitting: Urology

## 2017-12-03 VITALS — BP 106/67 | HR 98 | Ht 72.0 in | Wt 189.4 lb

## 2017-12-03 DIAGNOSIS — C672 Malignant neoplasm of lateral wall of bladder: Secondary | ICD-10-CM | POA: Diagnosis not present

## 2017-12-03 DIAGNOSIS — R310 Gross hematuria: Secondary | ICD-10-CM

## 2017-12-03 DIAGNOSIS — D090 Carcinoma in situ of bladder: Secondary | ICD-10-CM | POA: Diagnosis not present

## 2017-12-03 LAB — MICROSCOPIC EXAMINATION: RBC, UA: >30 /hpf — AB (ref 0–2)

## 2017-12-03 LAB — URINALYSIS, COMPLETE
Bilirubin, UA: NEGATIVE
GLUCOSE, UA: NEGATIVE
KETONES UA: NEGATIVE
NITRITE UA: NEGATIVE
Protein, UA: NEGATIVE
SPEC GRAV UA: 1.015 (ref 1.005–1.030)
Urobilinogen, Ur: 4 mg/dL — ABNORMAL HIGH (ref 0.2–1.0)
pH, UA: 6 (ref 5.0–7.5)

## 2017-12-05 LAB — CULTURE, URINE COMPREHENSIVE

## 2017-12-20 ENCOUNTER — Ambulatory Visit
Admission: RE | Admit: 2017-12-20 | Discharge: 2017-12-20 | Disposition: A | Discharge status: Home / Self Care | Payer: Medicare Other | Source: Ambulatory Visit | Attending: Urology | Admitting: Urology

## 2017-12-20 DIAGNOSIS — R310 Gross hematuria: Secondary | ICD-10-CM | POA: Insufficient documentation

## 2017-12-20 DIAGNOSIS — I700 Atherosclerosis of aorta: Secondary | ICD-10-CM | POA: Insufficient documentation

## 2017-12-20 DIAGNOSIS — C672 Malignant neoplasm of lateral wall of bladder: Secondary | ICD-10-CM

## 2017-12-20 DIAGNOSIS — D090 Carcinoma in situ of bladder: Secondary | ICD-10-CM | POA: Insufficient documentation

## 2017-12-20 DIAGNOSIS — N400 Enlarged prostate without lower urinary tract symptoms: Secondary | ICD-10-CM | POA: Diagnosis not present

## 2017-12-20 MED ORDER — IOPAMIDOL (ISOVUE-300) INJECTION 61%
125.0000 mL | Freq: Once | INTRAVENOUS | Status: AC | PRN
  Administered 2017-12-20: 125 mL via INTRAVENOUS

## 2017-12-27 ENCOUNTER — Ambulatory Visit (INDEPENDENT_AMBULATORY_CARE_PROVIDER_SITE_OTHER): Payer: Medicare Other | Admitting: Urology

## 2017-12-27 DIAGNOSIS — C672 Malignant neoplasm of lateral wall of bladder: Secondary | ICD-10-CM | POA: Diagnosis not present

## 2017-12-27 LAB — URINALYSIS, COMPLETE
Bilirubin, UA: NEGATIVE
GLUCOSE, UA: NEGATIVE
Ketones, UA: NEGATIVE
Nitrite, UA: NEGATIVE
PH UA: 5.5 (ref 5.0–7.5)
PROTEIN UA: NEGATIVE
Specific Gravity, UA: 1.01 (ref 1.005–1.030)
Urobilinogen, Ur: 2 mg/dL — ABNORMAL HIGH (ref 0.2–1.0)

## 2017-12-27 LAB — MICROSCOPIC EXAMINATION: RBC, UA: >30 /hpf — ABNORMAL HIGH (ref 0–2)

## 2017-12-27 MED ORDER — LIDOCAINE HCL URETHRAL/MUCOSAL 2 % EX GEL: 1.0000 "application " | Freq: Once | CUTANEOUS | Status: DC

## 2017-12-27 MED ORDER — CIPROFLOXACIN HCL 500 MG PO TABS: 500.0000 mg | ORAL_TABLET | Freq: Once | ORAL | Status: DC

## 2017-12-27 MED ORDER — SULFAMETHOXAZOLE-TRIMETHOPRIM 800-160 MG PO TABS: 1.0000 | ORAL_TABLET | Freq: Two times a day (BID) | ORAL | 0 refills | Status: DC

## 2017-12-27 NOTE — Progress Notes (Signed)
Cystoscopy canceled today, urine obtained via straight cath showed no epithelial cells, 11-30 WBCs, >30RBCs, and moderate bacteria, sent for culture.  Will start bactrim BID x 7 days, reschedule cysto for next week with me on Thursday 9/5  Billey Co, MD 12/27/2017

## 2017-12-27 NOTE — Addendum Note (Signed)
Addended by: Billey Co on: 12/27/2017 04:48 PM   Modules accepted: Orders, Level of Service

## 2018-01-03 ENCOUNTER — Ambulatory Visit (INDEPENDENT_AMBULATORY_CARE_PROVIDER_SITE_OTHER): Payer: Medicare Other | Admitting: Urology

## 2018-01-03 ENCOUNTER — Encounter: Payer: Self-pay | Admitting: Urology

## 2018-01-03 DIAGNOSIS — C672 Malignant neoplasm of lateral wall of bladder: Secondary | ICD-10-CM

## 2018-01-03 LAB — URINALYSIS, COMPLETE
BILIRUBIN UA: NEGATIVE
Glucose, UA: NEGATIVE
KETONES UA: NEGATIVE
Leukocytes, UA: NEGATIVE
NITRITE UA: NEGATIVE
PH UA: 5.5 (ref 5.0–7.5)
Protein, UA: NEGATIVE
RBC UA: NEGATIVE
SPEC GRAV UA: 1.01 (ref 1.005–1.030)
Urobilinogen, Ur: 1 mg/dL (ref 0.2–1.0)

## 2018-01-03 LAB — MICROSCOPIC EXAMINATION

## 2018-01-03 MED ORDER — LIDOCAINE HCL URETHRAL/MUCOSAL 2 % EX GEL
1.0000 "application " | Freq: Once | CUTANEOUS | Status: AC
  Administered 2018-01-03: 1 via URETHRAL

## 2018-01-03 NOTE — Progress Notes (Addendum)
Bladder cancer surveillance note  INDICATION Hx HG Ta, CIS urothelial cell carcinoma  UROLOGIC HISTORY Kyle Gregory is a 77 y.o. male s/p TURBT for 3 cm right sided high-grade TA, and CIS April 2018 with Dr. Erlene Quan.  He completed 3 out of 6 courses of induction BCG.  He had been lost to follow-up secondary to multiple comorbidities, including numerous cardiac events requiring hospitalization.  Initial Diagnosis of Bladder  Year: 07/2017 Pathology: HG Ta, CIS  Treatments for Bladder Cancer TURBT 07/2016 Induction BCG 3/6 courses  Imaging: CTU 12/20/2017 personally reviewed.  IMPRESSION: 1. Limited motion degraded scan. 2. Nonspecific diffuse bladder wall thickening is slightly asymmetrically prominent in the lateral right bladder wall with associated slight asymmetric bladder wall hyperenhancement, cannot exclude recurrent bladder malignancy in this location. 3. No evidence of metastatic disease in the abdomen or pelvis. 4. No evidence of upper tract urothelial lesions, with limitations as described. No suspicious renal cortical masses. 5. Mildly enlarged prostate. 6.  Aortic Atherosclerosis (ICD10-I70.0). 7. Additional chronic findings as detailed.  AUA Risk Category High  Cystoscopy Procedure Note:  After informed consent and discussion of the procedure and its risks, ZEBBIE ACE was positioned and prepped in the standard fashion. Cystoscopy was performed with the a flexible cystoscope. The urethra, bladder neck and entire bladder was visualized in a standard fashion. There was recurrence of papillary appearing tumor along the right lateral wall and trigone, ~2-3cm total. Cytology sent.  Plan: Schedule TURBT Needs cardiac clearance and stop coumadin prior  Billey Co, MD 01/03/2018

## 2018-01-09 ENCOUNTER — Other Ambulatory Visit: Payer: Self-pay | Admitting: Radiology

## 2018-01-09 DIAGNOSIS — C679 Malignant neoplasm of bladder, unspecified: Secondary | ICD-10-CM

## 2018-01-10 ENCOUNTER — Inpatient Hospital Stay
Admission: EM | Admit: 2018-01-10 | Discharge: 2018-01-12 | DRG: 309 | Disposition: A | Discharge status: Home Health | Payer: Medicare Other | Attending: Internal Medicine | Admitting: Internal Medicine

## 2018-01-10 ENCOUNTER — Emergency Department: Payer: Medicare Other

## 2018-01-10 ENCOUNTER — Other Ambulatory Visit: Payer: Self-pay

## 2018-01-10 DIAGNOSIS — Z8249 Family history of ischemic heart disease and other diseases of the circulatory system: Secondary | ICD-10-CM

## 2018-01-10 DIAGNOSIS — F0390 Unspecified dementia without behavioral disturbance: Secondary | ICD-10-CM | POA: Diagnosis present

## 2018-01-10 DIAGNOSIS — Z7901 Long term (current) use of anticoagulants: Secondary | ICD-10-CM | POA: Diagnosis not present

## 2018-01-10 DIAGNOSIS — I482 Chronic atrial fibrillation: Secondary | ICD-10-CM | POA: Diagnosis present

## 2018-01-10 DIAGNOSIS — D509 Iron deficiency anemia, unspecified: Secondary | ICD-10-CM | POA: Diagnosis present

## 2018-01-10 DIAGNOSIS — R7989 Other specified abnormal findings of blood chemistry: Secondary | ICD-10-CM

## 2018-01-10 DIAGNOSIS — I959 Hypotension, unspecified: Secondary | ICD-10-CM | POA: Diagnosis present

## 2018-01-10 DIAGNOSIS — I5022 Chronic systolic (congestive) heart failure: Secondary | ICD-10-CM | POA: Diagnosis present

## 2018-01-10 DIAGNOSIS — Z7982 Long term (current) use of aspirin: Secondary | ICD-10-CM | POA: Diagnosis not present

## 2018-01-10 DIAGNOSIS — I429 Cardiomyopathy, unspecified: Secondary | ICD-10-CM | POA: Diagnosis present

## 2018-01-10 DIAGNOSIS — I2510 Atherosclerotic heart disease of native coronary artery without angina pectoris: Secondary | ICD-10-CM | POA: Diagnosis present

## 2018-01-10 DIAGNOSIS — Z79899 Other long term (current) drug therapy: Secondary | ICD-10-CM

## 2018-01-10 DIAGNOSIS — Z8673 Personal history of transient ischemic attack (TIA), and cerebral infarction without residual deficits: Secondary | ICD-10-CM | POA: Diagnosis not present

## 2018-01-10 DIAGNOSIS — R778 Other specified abnormalities of plasma proteins: Secondary | ICD-10-CM

## 2018-01-10 DIAGNOSIS — R001 Bradycardia, unspecified: Secondary | ICD-10-CM | POA: Diagnosis not present

## 2018-01-10 DIAGNOSIS — Z888 Allergy status to other drugs, medicaments and biological substances status: Secondary | ICD-10-CM

## 2018-01-10 DIAGNOSIS — Z952 Presence of prosthetic heart valve: Secondary | ICD-10-CM

## 2018-01-10 DIAGNOSIS — Z951 Presence of aortocoronary bypass graft: Secondary | ICD-10-CM

## 2018-01-10 DIAGNOSIS — Z66 Do not resuscitate: Secondary | ICD-10-CM | POA: Diagnosis present

## 2018-01-10 DIAGNOSIS — J449 Chronic obstructive pulmonary disease, unspecified: Secondary | ICD-10-CM | POA: Diagnosis present

## 2018-01-10 HISTORY — DX: Cerebral infarction, unspecified: I63.9

## 2018-01-10 LAB — BASIC METABOLIC PANEL
Anion gap: 9 (ref 5–15)
BUN: 42 mg/dL — ABNORMAL HIGH (ref 8–23)
CHLORIDE: 100 mmol/L (ref 98–111)
CO2: 23 mmol/L (ref 22–32)
Calcium: 9.3 mg/dL (ref 8.9–10.3)
Creatinine, Ser: 2 mg/dL — ABNORMAL HIGH (ref 0.61–1.24)
GFR calc non Af Amer: 30 mL/min — ABNORMAL LOW (ref >60)
GFR, EST AFRICAN AMERICAN: 35 mL/min — AB (ref >60)
Glucose, Bld: 91 mg/dL (ref 70–99)
Potassium: 5.7 mmol/L — ABNORMAL HIGH (ref 3.5–5.1)
SODIUM: 132 mmol/L — AB (ref 135–145)

## 2018-01-10 LAB — TSH
TSH: 2.973 u[IU]/mL (ref 0.350–4.500)
TSH: 2.468 u[IU]/mL (ref 0.350–4.500)

## 2018-01-10 LAB — PROTIME-INR
INR: 1.47
Prothrombin Time: 17.7 seconds — ABNORMAL HIGH (ref 11.4–15.2)

## 2018-01-10 LAB — CBC
HEMATOCRIT: 40.7 % (ref 40.0–52.0)
Hemoglobin: 13.8 g/dL (ref 13.0–18.0)
MCH: 31.9 pg (ref 26.0–34.0)
MCHC: 34 g/dL (ref 32.0–36.0)
MCV: 93.7 fL (ref 80.0–100.0)
Platelets: 146 10*3/uL — ABNORMAL LOW (ref 150–440)
RBC: 4.35 MIL/uL — ABNORMAL LOW (ref 4.40–5.90)
RDW: 15.7 % — ABNORMAL HIGH (ref 11.5–14.5)
WBC: 4.6 10*3/uL (ref 3.8–10.6)

## 2018-01-10 LAB — BRAIN NATRIURETIC PEPTIDE: B NATRIURETIC PEPTIDE 5: 990 pg/mL — AB (ref 0.0–100.0)

## 2018-01-10 LAB — APTT: APTT: 30 s (ref 24–36)

## 2018-01-10 LAB — TROPONIN I: Troponin I: 0.06 ng/mL (ref <0.03)

## 2018-01-10 MED ORDER — ONDANSETRON HCL 4 MG/2ML IJ SOLN: 4.0000 mg | Freq: Four times a day (QID) | INTRAMUSCULAR | Status: DC | PRN

## 2018-01-10 MED ORDER — FERROUS SULFATE 325 (65 FE) MG PO TABS
325.0000 mg | ORAL_TABLET | Freq: Two times a day (BID) | ORAL | Status: DC
  Administered 2018-01-11 – 2018-01-12 (×4): 325 mg via ORAL
  Filled 2018-01-10 (×4): qty 1

## 2018-01-10 MED ORDER — ACETAMINOPHEN 650 MG RE SUPP: 650.0000 mg | Freq: Four times a day (QID) | RECTAL | Status: DC | PRN

## 2018-01-10 MED ORDER — WARFARIN SODIUM 5 MG PO TABS
5.0000 mg | ORAL_TABLET | Freq: Once | ORAL | Status: DC
  Filled 2018-01-10: qty 1

## 2018-01-10 MED ORDER — ACETAMINOPHEN 325 MG PO TABS: 650.0000 mg | ORAL_TABLET | Freq: Four times a day (QID) | ORAL | Status: DC | PRN

## 2018-01-10 MED ORDER — HYDROCODONE-ACETAMINOPHEN 5-325 MG PO TABS: 1.0000 | ORAL_TABLET | ORAL | Status: DC | PRN

## 2018-01-10 MED ORDER — CALCIUM CARBONATE-VITAMIN D 500-200 MG-UNIT PO TABS
1.0000 | ORAL_TABLET | Freq: Every day | ORAL | Status: DC
  Administered 2018-01-11 – 2018-01-12 (×2): 1 via ORAL
  Filled 2018-01-10 (×2): qty 1

## 2018-01-10 MED ORDER — DONEPEZIL HCL 5 MG PO TABS: 5.0000 mg | ORAL_TABLET | Freq: Every day | ORAL | Status: DC

## 2018-01-10 MED ORDER — SODIUM CHLORIDE 0.9 % IV BOLUS
500.0000 mL | Freq: Once | INTRAVENOUS | Status: AC
  Administered 2018-01-10: 500 mL via INTRAVENOUS

## 2018-01-10 MED ORDER — FLUOXETINE HCL 20 MG PO CAPS
20.0000 mg | ORAL_CAPSULE | Freq: Every day | ORAL | Status: DC
  Administered 2018-01-11 – 2018-01-12 (×2): 20 mg via ORAL
  Filled 2018-01-10 (×2): qty 1

## 2018-01-10 MED ORDER — ONDANSETRON HCL 4 MG PO TABS: 4.0000 mg | ORAL_TABLET | Freq: Four times a day (QID) | ORAL | Status: DC | PRN

## 2018-01-10 MED ORDER — MELATONIN 5 MG PO TABS
1.0000 | ORAL_TABLET | Freq: Every day | ORAL | Status: DC
  Administered 2018-01-10 – 2018-01-11 (×2): 5 mg via ORAL
  Filled 2018-01-10 (×4): qty 1

## 2018-01-10 MED ORDER — PRAVASTATIN SODIUM 40 MG PO TABS
40.0000 mg | ORAL_TABLET | Freq: Every day | ORAL | Status: DC
  Administered 2018-01-10 – 2018-01-11 (×2): 40 mg via ORAL
  Filled 2018-01-10 (×2): qty 1

## 2018-01-10 MED ORDER — WARFARIN - PHARMACIST DOSING INPATIENT
Freq: Every day | Status: DC
  Administered 2018-01-11: 20:00:00
  Filled 2018-01-10 (×6): qty 1

## 2018-01-10 MED ORDER — SODIUM CHLORIDE 0.9 % IV SOLN
INTRAVENOUS | Status: DC
  Administered 2018-01-10 – 2018-01-12 (×3): via INTRAVENOUS

## 2018-01-10 MED ORDER — POLYETHYLENE GLYCOL 3350 17 G PO PACK: 17.0000 g | PACK | Freq: Every day | ORAL | Status: DC | PRN

## 2018-01-10 NOTE — ED Triage Notes (Signed)
Pt arrives to ED with wife who states hypotensive and bradycardia that began yesterday. Called doctor, MD told wife to stop lisinopril. Few weeks ago stopped metoprolol. At 8am 107/53, HR 39, at 11am 99/57 Hr 42. At 12:30 110/52, HR 48. 2pm 87/36 with HR 32. Pt denies dizziness or lightheadedness. Pt alert and talking in complete sentences. Denies pain. SOB earlier but denies currently.

## 2018-01-10 NOTE — ED Provider Notes (Signed)
[facility] Emergency Department Provider Note  ____________________________________________  Time seen: Approximately 5:21 PM  I have reviewed the triage vital signs and the nursing notes.   HISTORY  Chief Complaint Bradycardia and Hypotension    HPI Kyle Gregory is a 77 y.o. male with a history of A. fib on Coumadin, CAD status post CABG, aortic stenosis status post aortic valve replacement, cardiomyopathy and CHF, presenting for asymptomatic hypotension and bradycardia.  The patient's wife reports that he was monitored by a home health worker yesterday and was found to be bradycardic and mildly hypotensive.  Today, the patient did not have any symptoms but his wife found him to be hypotensive 2 out of 4 readings, with a heart rate from the high 30s to the mid 40s.  He generally has a heart rate in the 60s according to his wife.  The patient denies any chest pain, lightheadedness or syncope, palpitations, shortness of breath, diaphoresis.  Yesterday, he was told to stop taking the lisinopril which he did not have today.  The patient is unable to contribute significantly to his history due to his dementia; however, he states he is not having any pain at this time.   Past Medical History:  Diagnosis Date  . Anemia   . Aortic stenosis   . Arrhythmia   . Atrial fibrillation ([place])   . Cardiomyopathy ([place])   . CHF (congestive heart failure) ([place])   . COPD (chronic obstructive pulmonary disease) ([place])   . Heartburn   . Stroke [facility])     Patient Active Problem List   Diagnosis Date Noted  . Bradycardia 11/10/2017  . Vascular dementia without behavioral disturbance 10/09/2017  . Syncope 09/03/2017  . Pressure injury of skin 09/03/2017  . Medicare annual wellness visit, initial 06/26/2017  . Chronic systolic heart failure ([place]) 05/04/2017  . Lymphedema 05/04/2017  . Morbid obesity with body mass index of 40.0-49.9 ([place]) 07/12/2016  . Aortic stenosis, mild  07/11/2016  . 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 Aortic atherosclerosis ([place]) 06/12/2016  . B12 deficiency 06/12/2016  . Iron deficiency anemia due to chronic blood loss 06/12/2016  . Atrial fibrillation, chronic ([place]) 06/09/2016    Past Surgical History:  Procedure Laterality Date  . 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 CYSTOSCOPY W/ RETROGRADES Bilateral 08/14/2016   Procedure: CYSTOSCOPY WITH RETROGRADE PYELOGRAM;  Surgeon: Hollice Espy, MD;  Location: [facility] ORS;  Service: Urology;  Laterality: Bilateral;  . LEFT HEART CATH AND CORONARY ANGIOGRAPHY Left 09/05/2016   Procedure: Left Heart Cath and Coronary Angiography;  Surgeon: Teodoro Spray, MD;  Location: [facility];  Service: Cardiovascular;  Laterality: Left;  . neck growth removal     1970's  . TONSILLECTOMY    . TRANSURETHRAL RESECTION OF BLADDER TUMOR N/A 08/14/2016   Procedure: TRANSURETHRAL RESECTION OF BLADDER TUMOR (TURBT);  Surgeon: Hollice Espy, MD;  Location: [facility] ORS;  Service: Urology;  Laterality: N/A;    Current Outpatient Rx  . Order #: [id number] Class: Historical Med  . Order #: [id number] Class: Historical Med  . Order #: [id number] Class: Historical Med  . Order #: [id number] Class: Historical Med  . Order #: [id number] Class: Historical Med  . Order #: [id number] Class: Historical Med  . Order #: [id number] Class: Normal  . Order #: [id number] Class: Historical Med  . Order #: [id number] Class: Historical Med  . Order #: [id number] Class: Historical Med  . Order #: [id number] Class: Historical Med  . Order #:  [id number] Class: Historical Med  . Order #: [id number] Class: Historical Med  . Order #: [id number] Class: Normal  . Order #: [id number] Class: Historical Med  . Order #: [id number] Class: Historical Med    Allergies Amiodarone and Donepezil  Family History  Problem Relation Age of Onset  . Heart failure Father   . Heart failure Brother   . Atrial  fibrillation Sister   . Prostate cancer Neg Hx   . Kidney cancer Neg Hx   . Bladder Cancer Neg Hx     Social History Social History   Tobacco Use  . Smoking status: Never Smoker  . Smokeless tobacco: Never Used  Substance Use Topics  . Alcohol use: No  . Drug use: No    Review of Systems Constitutional: No fever/chills.  No lightheadedness or syncope. Eyes: No visual changes.  No blurred or double vision. ENT: No sore throat. No congestion or rhinorrhea. Cardiovascular: Denies chest pain. Denies palpitations.  Hypotension and bradycardia. Respiratory: Denies shortness of breath.  No cough. Gastrointestinal: No abdominal pain.  No nausea, no vomiting.  No diarrhea.  No constipation. Genitourinary: Negative for dysuria. Musculoskeletal: Negative for back pain. Skin: Negative for rash. Neurological: Negative for headaches. No focal numbness, tingling or weakness.     ____________________________________________   PHYSICAL EXAM:  VITAL SIGNS: ED Triage Vitals [01/10/18 1716]  Enc Vitals Group     BP 115/84     Pulse Rate (!) 34     Resp      Temp 98 F (36.7 C)     Temp Source Oral     SpO2 98 %     Weight      Height      Head Circumference      Peak Flow      Pain Score      Pain Loc      Pain Edu?      Excl. in [place]?     Constitutional: Alert with mild dementia; answers most questions appropriately.  The patient is able to follow simple commands. Eyes: Conjunctivae are normal.  EOMI. No scleral icterus. Head: Atraumatic. Nose: No congestion/rhinnorhea. Mouth/Throat: Mucous membranes are moist.  Neck: No stridor.  Supple.  No JVD.  No meningismus. Cardiovascular: Slow rate, regular rhythm. No murmurs, rubs or gallops.  Respiratory: Normal respiratory effort.  No accessory muscle use or retractions. Lungs CTAB.  No wheezes, rales or ronchi. Gastrointestinal: Soft, nontender and nondistended.  No guarding or rebound.  No peritoneal signs. Musculoskeletal:  Mild symmetric pitting LE edema to the distal tibial shaft. No ttp in the calves or palpable cords.  Negative Homan's sign. Neurologic:  Alert.  Speech is clear.  Face and smile are symmetric.  EOMI.  Moves all extremities well. Skin:  Skin is warm, dry and intact. No rash noted. Psychiatric: Mood and affect are normal. ____________________________________________   LABS (all labs ordered are listed, but only abnormal results are displayed)  Labs Reviewed  BRAIN NATRIURETIC PEPTIDE  CBC  BASIC METABOLIC PANEL  TROPONIN I  TSH  PROTIME-INR  APTT   ____________________________________________  EKG  ED ECG REPORT I, Anne-Caroline Mariea Clonts, the attending physician, personally viewed and interpreted this ECG.   Date: 01/10/2018  EKG Time: 1715  Rate: 39  Rhythm: atrial fibrillation, with bradycardia  Axis: leftward  Intervals:nonspecific intraventricular conduction delay  ST&T Change: No STEMI; no ischemic changes  ____________________________________________  RADIOLOGY  No results found.  ____________________________________________   PROCEDURES  Procedure(s) performed: None  Procedures  Critical Care  performed: No ____________________________________________   INITIAL IMPRESSION / ASSESSMENT AND PLAN / ED COURSE  Pertinent labs & imaging results that were available during my care of the patient were reviewed by me and considered in my medical decision making (see chart for details).  77 y.o. male with a history of A. fib on Coumadin, lisinopril discontinued yesterday, presenting with asymptomatic hypotension and bradycardia.  Here, the patient does have a normal blood pressure 115/84 but his heart rate is between 34 and 44 on my examination.  He is completely asymptomatic and continues to mentate at baseline.  The patient is not on any beta-blocker, and his lisinopril was discontinued yesterday; iatrogenic cause for bradycardia is possible but there are other  possible causes as well.  The patient's EKG does show atrial fibrillation.  We will get a troponin, thyroid panel, and measure his electrolytes today.  The patient will be admitted for continued monitoring and treatment.  ____________________________________________  FINAL CLINICAL IMPRESSION(S) / ED DIAGNOSES  Final diagnoses:  Hypotension, unspecified hypotension type  Bradycardia         NEW MEDICATIONS STARTED DURING THIS VISIT:  New Prescriptions   No medications on file      Eula Listen, MD 01/10/18 1749

## 2018-01-10 NOTE — Progress Notes (Signed)
ANTICOAGULATION CONSULT NOTE - Initial Consult  Pharmacy Consult for Warfarin dosing Indication: atrial fibrillation  Allergies  Allergen Reactions  . Amiodarone     Other reaction(s): Dizziness  . Donepezil Palpitations    Severe bradycardia    Patient Measurements: Height: 6' (182.9 cm) Weight: 187 lb (84.8 kg) IBW/kg (Calculated) : 77.6 Heparin Dosing Weight:   Vital Signs: Temp: 98 F (36.7 C) (09/12 1716) Temp Source: Oral (09/12 1716) BP: 107/51 (09/12 1749) Pulse Rate: 68 (09/12 1749)  Labs: Recent Labs    01/10/18 1729  HGB 13.8  HCT 40.7  PLT 146*  APTT 30  LABPROT 17.7*  INR 1.47  CREATININE 2.00*  TROPONINI 0.06*    Estimated Creatinine Clearance: 34 mL/min (A) (by C-G formula based on SCr of 2 mg/dL (H)).   Medical History: Past Medical History:  Diagnosis Date  . Anemia   . Aortic stenosis   . Arrhythmia   . Atrial fibrillation ([place])   . Cardiomyopathy ([place])   . CHF (congestive heart failure) ([place])   . COPD (chronic obstructive pulmonary disease) ([place])   . Heartburn   . Stroke [facility])     Medications:    Assessment: 77 y.o. male with a known history of CAD status post CABG, aortic stenosis status post aortic valve replacement and chronic atrial fibrillation on anticoagulation who presents with low blood pressure and heart rate. On 9/12 INR 1.47.  Out patient dose is Warfarin 3 mg once daily.  Goal of Therapy:  INR 2-3 Monitor platelets by anticoagulation protocol: Yes   Plan:  Warfarin 5 mg once.  Will draw INR with morning lab and reevaluate dose.  Forrest Moron 01/10/2018,6:48 PM

## 2018-01-10 NOTE — Progress Notes (Signed)
Family Meeting Note  Advance Directive:no  Today a meeting took place with the Patient.spouse   The following clinical team members were present during this meeting:MD  The following were discussed:Patient's diagnosis: Bradycardia Hypotension CAD Chronic atrial fibrillation, Patient's progosis: > 12 months and Goals for treatment: DNR  Additional follow-up to be provided: Chaplain consultation for advanced directives  Time spent during discussion: 17 minutes  Alba Perillo, MD

## 2018-01-10 NOTE — H&P (Signed)
[place] at [place] NAME: Kyle Gregory    MR#:  [id number]  DATE OF BIRTH:  1940/11/15  DATE OF ADMISSION:  01/10/2018  PRIMARY CARE PHYSICIAN: Rusty Aus, MD   REQUESTING/REFERRING PHYSICIAN: Dr. Mariea Clonts  CHIEF COMPLAINT:   Dizziness with low blood pressure HISTORY OF PRESENT ILLNESS:  Kyle Gregory  is a 77 y.o. male with a known history of CAD status post CABG, aortic stenosis status post aortic valve replacement and chronic atrial fibrillation on anticoagulation who presents with low blood pressure and heart rate. Patient's wife reports that yesterday the home health care worker found patient to have low heart rate as well as low blood pressure.  As per recommendations by the PCP office lisinopril was held. She was taking his blood pressure today and several times today his blood pressure was low and therefore he presents to the ER for further evaluation.  In the emergency room his heart rate is anywhere between 38-70.  He does have some dizziness.  He denies chest pain, shortness of breath, palpitations or diaphoresis. His blood pressure initially was in the 90s and with IV fluids to systolic is 193  PAST MEDICAL HISTORY:   Past Medical History:  Diagnosis Date  . Anemia   . Aortic stenosis   . Arrhythmia   . Atrial fibrillation ([place])   . Cardiomyopathy ([place])   . CHF (congestive heart failure) ([place])   . COPD (chronic obstructive pulmonary disease) ([place])   . Heartburn   . Stroke [facility])     PAST SURGICAL HISTORY:   Past Surgical History:  Procedure Laterality Date  . 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 CYSTOSCOPY W/ RETROGRADES Bilateral 08/14/2016   Procedure: CYSTOSCOPY WITH RETROGRADE PYELOGRAM;  Surgeon: Hollice Espy, MD;  Location: [facility] ORS;  Service: Urology;  Laterality: Bilateral;  . LEFT HEART CATH AND CORONARY ANGIOGRAPHY Left 09/05/2016   Procedure: Left Heart Cath and  Coronary Angiography;  Surgeon: Teodoro Spray, MD;  Location: [place] [facility] LAB;  Service: Cardiovascular;  Laterality: Left;  . neck growth removal     1970's  . TONSILLECTOMY    . TRANSURETHRAL RESECTION OF BLADDER TUMOR N/A 08/14/2016   Procedure: TRANSURETHRAL RESECTION OF BLADDER TUMOR (TURBT);  Surgeon: Hollice Espy, MD;  Location: [facility] ORS;  Service: Urology;  Laterality: N/A;    SOCIAL HISTORY:   Social History   Tobacco Use  . Smoking status: Never Smoker  . Smokeless tobacco: Never Used  Substance Use Topics  . Alcohol use: No    FAMILY HISTORY:   Family History  Problem Relation Age of Onset  . Heart failure Father   . Heart failure Brother   . Atrial fibrillation Sister   . Prostate cancer Neg Hx   . Kidney cancer Neg Hx   . Bladder Cancer Neg Hx     DRUG ALLERGIES:   Allergies  Allergen Reactions  . Amiodarone     Other reaction(s): Dizziness  . Donepezil Palpitations    Severe bradycardia    REVIEW OF SYSTEMS:   Review of Systems  Constitutional: Positive for malaise/fatigue. Negative for chills and fever.  HENT: Negative.  Negative for ear discharge, ear pain, hearing loss, nosebleeds and sore throat.   Eyes: Negative.  Negative for blurred vision and pain.  Respiratory: Negative.  Negative for cough, hemoptysis, shortness of breath and wheezing.   Cardiovascular: Negative.  Negative for chest  pain, palpitations and leg swelling.  Gastrointestinal: Negative.  Negative for abdominal pain, blood in stool, diarrhea, nausea and vomiting.  Genitourinary: Negative.  Negative for dysuria.  Musculoskeletal: Negative.  Negative for back pain.  Skin: Negative.   Neurological: Positive for dizziness and weakness. Negative for tremors, speech change, focal weakness, seizures and headaches.  Endo/Heme/Allergies: Negative.  Does not bruise/bleed easily.  Psychiatric/Behavioral: Negative.  Negative for depression, hallucinations and suicidal ideas.     MEDICATIONS AT HOME:   Prior to Admission medications   Medication Sig Start Date End Date Taking? Authorizing Provider  amiodarone (PACERONE) 200 MG tablet Take 200 mg by mouth daily.     [provider]  aspirin (ASPIRIN 81) 81 MG EC tablet Take 1 tablet by mouth daily.     [provider]  Calcium Carbonate-Vitamin D (OYSTER SHELL CALCIUM 500 + D) 500-125 MG-UNIT TABS Take 1 tablet by mouth daily.     [provider]  donepezil (ARICEPT) 5 MG tablet TAKE 1 TABLET BY MOUTH EVERY DAY AT NIGHT 09/21/17   [provider]  ferrous sulfate 325 (65 FE) MG tablet Take 325 mg by mouth 2 (two) times daily with a meal.    [provider]  FLUoxetine (PROZAC) 20 MG capsule Take 20 mg by mouth daily.     [provider]  furosemide (LASIX) 40 MG tablet Take 1 tablet (40 mg total) by mouth daily. 11/11/17   Fritzi Mandes, MD  lisinopril (PRINIVIL,ZESTRIL) 5 MG tablet Take by mouth. 12/18/17 12/18/18  [provider]  Melatonin 3 MG TABS Take 1 tablet by mouth at bedtime.    [provider]  nitroGLYCERIN (NITROSTAT) 0.4 MG SL tablet Place 0.4 mg under the tongue every 5 (five) minutes as needed for chest pain (Up to 3 doses).  02/22/17 02/22/18  [provider]  omeprazole (PRILOSEC) 20 MG capsule Take 20 mg by mouth daily as needed (Heartburn or acid reflux).     [provider]  pravastatin (PRAVACHOL) 40 MG tablet Take 1 tablet by mouth at bedtime.  04/13/17   [provider]  spironolactone (ALDACTONE) 25 MG tablet Take 1 tablet by mouth daily. 05/21/17 05/21/18  [provider]  sulfamethoxazole-trimethoprim (BACTRIM DS,SEPTRA DS) 800-160 MG tablet Take 1 tablet by mouth every 12 (twelve) hours. 12/27/17   Billey Co, MD  warfarin (COUMADIN) 2 MG tablet Take 3 mg by mouth at bedtime.     [provider]  zinc oxide ([employer] ZINC OXIDE) 20 % ointment Apply 1 application topically as needed  for irritation.    [provider]      VITAL SIGNS:  Blood pressure (!) 107/51, pulse 68, temperature 98 F (36.7 C), temperature source Oral, resp. rate (!) 21, height 6' (1.829 m), weight 84.8 kg, SpO2 98 %.  PHYSICAL EXAMINATION:   Physical Exam  Constitutional: He is oriented to person, place, and time. No distress.  HENT:  Head: Normocephalic.  Eyes: No scleral icterus.  Neck: Normal range of motion. Neck supple. No JVD present. No tracheal deviation present.  Cardiovascular: Normal heart sounds. Exam reveals no gallop and no friction rub.  No murmur heard. Loletha Grayer, irr irr  Pulmonary/Chest: Effort normal and breath sounds normal. No respiratory distress. He has no wheezes. He has no rales. He exhibits no tenderness.  Abdominal: Soft. Bowel sounds are normal. He exhibits no distension and no mass. There is no tenderness. There is no rebound and no guarding.  Musculoskeletal: Normal  range of motion. He exhibits no edema.  Neurological: He is alert and oriented to person, place, and time.  Skin: Skin is warm. No rash noted. No erythema.  Multiple brusies  Psychiatric: Judgment normal.      LABORATORY PANEL:   CBC Recent Labs  Lab 01/10/18 1729  WBC 4.6  HGB 13.8  HCT 40.7  PLT 146*   ------------------------------------------------------------------------------------------------------------------  Chemistries  No results for input(s): NA, K, CL, CO2, GLUCOSE, BUN, CREATININE, CALCIUM, MG, AST, ALT, ALKPHOS, BILITOT in the last 168 hours.  Invalid input(s): GFRCGP ------------------------------------------------------------------------------------------------------------------  Cardiac Enzymes No results for input(s): TROPONINI in the last 168 hours. ------------------------------------------------------------------------------------------------------------------  RADIOLOGY:  No results found.  EKG:  A. fib with heart rate 39 no ST elevation or  depression  IMPRESSION AND PLAN:   77 year old male with a history of chronic atrial fibrillation, aortic valve replacement and CAD who presents with low blood pressure and bradycardia.  1.  Bradycardia: Continue telemetry monitoring Check TSH [facility] cardiology consultation requested Stop amiodarone  2.  Hypotension: Continue IV fluids Hold blood pressure medications   3.  Chronic atrial fibrillation: Stop amiodarone Continue anticoagulation with pharmacy dosing Cardiology consultation  4.  CAD: Continue statin  5.  Aortic valve replacement: Continue Coumadin  6.  Iron deficiency anemia: Continue ferrous sulfate   All the records are reviewed and case discussed with ED provider. Management plans discussed with the patient and wife and they are in agreement  CODE STATUS: DNR  TOTAL TIME TAKING CARE OF THIS PATIENT: 55 minutes.    Ahniya Mitchum M.D on 01/10/2018 at 6:12 PM  Between 7am to 6pm - Pager - [phone]  After 6pm go to [email or web address] - password [email or web address] [facility]  Office  [phone]  CC: Primary care physician; Rusty Aus, MD

## 2018-01-10 NOTE — ED Notes (Signed)
Assisted pt to the toilet with EDT Zach for BM. Returned to Biomedical scientist. No complication, denies needs. No change in condition.

## 2018-01-10 NOTE — Progress Notes (Signed)
   01/10/18 2130  Clinical Encounter Type  Visited With Patient  Visit Type Initial  Referral From Nurse  Consult/Referral To Chaplain  Spiritual Encounters  Spiritual Needs Other (Comment)  Willis entered room and greeted patient warmly. Patient was awake and alert. Pleasant. Had a sandwich and drinks in front of him on portable tray. Shared with patient if he was interested in AD. Patient declined. Provided pastoral care through small talk. Pastoral visit was appreciated. No further visits needed.

## 2018-01-10 NOTE — ED Notes (Signed)
Date and time results received: 01/10/18 1847 (use smartphrase ".now" to insert current time)  Test: troponin Critical Value: 0.06  Name of Provider Notified: Mariea Clonts  Orders Received? Or Actions Taken?: MD notified

## 2018-01-11 LAB — PROTIME-INR
INR: 1.54
PROTHROMBIN TIME: 18.4 s — AB (ref 11.4–15.2)

## 2018-01-11 LAB — BASIC METABOLIC PANEL
Anion gap: 4 — ABNORMAL LOW (ref 5–15)
Anion gap: 4 — ABNORMAL LOW (ref 5–15)
BUN: 36 mg/dL — ABNORMAL HIGH (ref 8–23)
BUN: 33 mg/dL — AB (ref 8–23)
CALCIUM: 8.9 mg/dL (ref 8.9–10.3)
CHLORIDE: 106 mmol/L (ref 98–111)
CO2: 26 mmol/L (ref 22–32)
CO2: 22 mmol/L (ref 22–32)
CREATININE: 1.37 mg/dL — AB (ref 0.61–1.24)
CREATININE: 1.26 mg/dL — AB (ref 0.61–1.24)
Calcium: 8.6 mg/dL — ABNORMAL LOW (ref 8.9–10.3)
Chloride: 104 mmol/L (ref 98–111)
GFR calc Af Amer: 56 mL/min — ABNORMAL LOW (ref >60)
GFR calc Af Amer: >60 mL/min (ref >60)
GFR calc non Af Amer: 48 mL/min — ABNORMAL LOW (ref >60)
GFR calc non Af Amer: 53 mL/min — ABNORMAL LOW (ref >60)
GLUCOSE: 98 mg/dL (ref 70–99)
GLUCOSE: 95 mg/dL (ref 70–99)
Potassium: 5.8 mmol/L — ABNORMAL HIGH (ref 3.5–5.1)
Potassium: 5.8 mmol/L — ABNORMAL HIGH (ref 3.5–5.1)
SODIUM: 132 mmol/L — AB (ref 135–145)
Sodium: 134 mmol/L — ABNORMAL LOW (ref 135–145)

## 2018-01-11 LAB — CBC
HCT: 38.6 % — ABNORMAL LOW (ref 40.0–52.0)
Hemoglobin: 12.9 g/dL — ABNORMAL LOW (ref 13.0–18.0)
MCH: 31.6 pg (ref 26.0–34.0)
MCHC: 33.4 g/dL (ref 32.0–36.0)
MCV: 94.6 fL (ref 80.0–100.0)
PLATELETS: 122 10*3/uL — AB (ref 150–440)
RBC: 4.08 MIL/uL — ABNORMAL LOW (ref 4.40–5.90)
RDW: 15.7 % — ABNORMAL HIGH (ref 11.5–14.5)
WBC: 4.5 10*3/uL (ref 3.8–10.6)

## 2018-01-11 MED ORDER — WARFARIN SODIUM 5 MG PO TABS
5.0000 mg | ORAL_TABLET | Freq: Once | ORAL | Status: AC
  Administered 2018-01-11: 5 mg via ORAL
  Filled 2018-01-11 (×3): qty 1

## 2018-01-11 MED ORDER — SODIUM POLYSTYRENE SULFONATE 15 GM/60ML PO SUSP
30.0000 g | Freq: Once | ORAL | Status: AC
  Administered 2018-01-11: 30 g via ORAL
  Filled 2018-01-11: qty 120

## 2018-01-11 NOTE — Progress Notes (Signed)
Talked to Dr. Margaretmary Eddy about patient's potassium at 5.8, order to give Kayexalate once. RN will continue to monitor.

## 2018-01-11 NOTE — Progress Notes (Signed)
ANTICOAGULATION CONSULT NOTE - Initial Consult  Pharmacy Consult for Warfarin dosing Indication: atrial fibrillation  Allergies  Allergen Reactions  . Amiodarone     Other reaction(s): Dizziness  . Donepezil Palpitations    Severe bradycardia    Patient Measurements: Height: 6' (182.9 cm) Weight: 187 lb (84.8 kg) IBW/kg (Calculated) : 77.6 Heparin Dosing Weight:   Vital Signs: Temp: 98.4 F (36.9 C) (09/13 0805) Temp Source: Oral (09/13 0340) BP: 129/47 (09/13 0805) Pulse Rate: 48 (09/13 0805)  Labs: Recent Labs    01/10/18 1729 01/11/18 1051  HGB 13.8 12.9*  HCT 40.7 38.6*  PLT 146* 122*  APTT 30  --   LABPROT 17.7* 18.4*  INR 1.47 1.54  CREATININE 2.00* 1.37*  TROPONINI 0.06*  --     Estimated Creatinine Clearance: 49.6 mL/min (A) (by C-G formula based on SCr of 1.37 mg/dL (H)).   Medical History: Past Medical History:  Diagnosis Date  . Anemia   . Aortic stenosis   . Arrhythmia   . Atrial fibrillation ([place])   . Cardiomyopathy ([place])   . CHF (congestive heart failure) ([place])   . COPD (chronic obstructive pulmonary disease) ([place])   . Heartburn   . Stroke [facility])     Medications:    Assessment: 77 y.o. male with a known history of CAD status post CABG, aortic stenosis status post aortic valve replacement and chronic atrial fibrillation on anticoagulation who presents with low blood pressure and heart rate. On 9/12 INR 1.47 on admission. Of note pt was recently tx with bactrim for UTI and his warfarin dose was reduced to 1mg  daily while he was on due to DDI. 3mg  daily dose was resumed on 9/6.   Goal of Therapy:  INR 2-3 Monitor platelets by anticoagulation protocol: Yes   Plan:  Patient was ordered 5mg  last night, but dose was not given. I will give 5mg  tonight and recheck INR in the AM.  Ramond Dial, Pharm.D, BCPS Clinical Pharmacist 01/11/2018,11:43 AM

## 2018-01-11 NOTE — Progress Notes (Signed)
[place] at [place] NAME: Kyle Gregory    MR#:  [id number]  DATE OF BIRTH:  [birth year]  SUBJECTIVE:  CHIEF COMPLAINT: Patient's dizziness is better today while resting.  Heart rate is still at around 40s.  Wife at bedside  REVIEW OF SYSTEMS:  CONSTITUTIONAL: No fever, fatigue or weakness.  EYES: No blurred or double vision.  EARS, NOSE, AND THROAT: No tinnitus or ear pain.  RESPIRATORY: No cough, shortness of breath, wheezing or hemoptysis.  CARDIOVASCULAR: No chest pain, orthopnea, edema.  GASTROINTESTINAL: No nausea, vomiting, diarrhea or abdominal pain.  GENITOURINARY: No dysuria, hematuria.  ENDOCRINE: No polyuria, nocturia,  HEMATOLOGY: No anemia, easy bruising or bleeding SKIN: No rash or lesion. MUSCULOSKELETAL: No joint pain or arthritis.   NEUROLOGIC: No tingling, numbness, weakness.  PSYCHIATRY: No anxiety or depression.   DRUG ALLERGIES:   Allergies  Allergen Reactions  . Amiodarone     Other reaction(s): Dizziness  . Donepezil Palpitations    Severe bradycardia    VITALS:  Blood pressure (!) 129/47, pulse (!) 48, temperature 98.4 F (36.9 C), resp. rate 18, height 6' (1.829 m), weight 84.8 kg, SpO2 100 %.  PHYSICAL EXAMINATION:  GENERAL:  77 y.o.-year-old patient lying in the bed with no acute distress.  EYES: Pupils equal, round, reactive to light and accommodation. No scleral icterus. Extraocular muscles intact.  HEENT: Head atraumatic, normocephalic. Oropharynx and nasopharynx clear.  NECK:  Supple, no jugular venous distention. No thyroid enlargement, no tenderness.  LUNGS: Normal breath sounds bilaterally, no wheezing, rales,rhonchi or crepitation. No use of accessory muscles of respiration.  CARDIOVASCULAR: S1, S2 normal. No murmurs, rubs, or gallops.  ABDOMEN: Soft, nontender, nondistended. Bowel sounds present. No organomegaly or mass.  EXTREMITIES: No pedal edema, cyanosis, or clubbing.   NEUROLOGIC: Cranial nerves II through XII are intact. Muscle strength 5/5 in all extremities. Sensation intact. Gait not checked.  PSYCHIATRIC: The patient is alert and oriented x 3.  SKIN: No obvious rash, lesion, or ulcer.    LABORATORY PANEL:   CBC Recent Labs  Lab 01/11/18 1051  WBC 4.5  HGB 12.9*  HCT 38.6*  PLT 122*   ------------------------------------------------------------------------------------------------------------------  Chemistries  Recent Labs  Lab 01/11/18 1051  NA 134*  K 5.8*  CL 104  CO2 26  GLUCOSE 98  BUN 36*  CREATININE 1.37*  CALCIUM 8.9   ------------------------------------------------------------------------------------------------------------------  Cardiac Enzymes Recent Labs  Lab 01/10/18 1729  TROPONINI 0.06*   ------------------------------------------------------------------------------------------------------------------  RADIOLOGY:  Dg Chest 2 View  Result Date: 01/10/2018 CLINICAL DATA:  Patient with hypotension and bradycardia. EXAM: CHEST - 2 VIEW COMPARISON:  Chest radiograph 09/03/2017 FINDINGS: Monitoring leads overlie the patient. Cardiomegaly. Aortic atherosclerosis. Small left pleural effusion and left basilar opacities. No pneumothorax. Thoracic spine degenerative changes. IMPRESSION: Cardiomegaly. Probable small left pleural effusion and left basilar opacities favored to represent atelectasis. Electronically Signed   By: Lovey Newcomer M.D.   On: 01/10/2018 18:54    EKG:   Orders placed or performed during the hospital encounter of 01/10/18  . EKG 12-Lead  . EKG 12-Lead    ASSESSMENT AND PLAN:   77 year old male with a history of chronic atrial fibrillation, aortic valve replacement and CAD who presents with low blood pressure and bradycardia.  1.  Bradycardia: Continue telemetry monitoring nml TSH [facility] cardiology consultation requested Stop amiodarone and beta-blocker was discontinued by primary care  physician recently Under miscellaneous adverse effects of aricept less than 1% cases  heart  block can be caused , hold aricept for now   2.  Hypotension: Continue IV fluids Hold blood pressure medications   3.  Chronic atrial fibrillation: Stop amiodarone Continue anticoagulation with pharmacy dosing Cardiology consultation  4.  CAD: Continue statin  5.  Aortic valve replacement: Continue Coumadin  6.  Iron deficiency anemia: Continue ferrous sulfate    All the records are reviewed and case discussed with Care Management/Social Workerr. Management plans discussed with the patient, family and they are in agreement.  CODE STATUS: dnr   TOTAL TIME TAKING CARE OF THIS PATIENT: 36  minutes.   POSSIBLE D/C IN  1  DAYS, DEPENDING ON CLINICAL CONDITION.  Note: This dictation was prepared with Dragon dictation along with smaller phrase technology. Any transcriptional errors that result from this process are unintentional.   Nicholes Mango M.D on 01/11/2018 at 2:56 PM  Between 7am to 6pm - Pager - [phone] After 6pm go to [email or web address] - password [email or web address] [place] [facility]  Office  [phone]  CC: Primary care physician; Rusty Aus, MD

## 2018-01-11 NOTE — Care Management Note (Signed)
Case Management Note  Patient Details  Name: Kyle Gregory MRN: [id number] Date of Birth: 1940/11/14  Subjective/Objective:  Patient was admitted for hypotension and bradycardia which was noticed by patient home health nurse. Suffers from dementia and lives at home with his wife Kyle Gregory [phone] and she is his primary caregiver. Patient is open to nursing and physical therapy services via [facility]. Spouse has also hired private care services multiple times a week whish she reports has helped her significantly. Patient has a walker in the home. Spouse would like to continue the services and transition of care plan that was set in place. Spouse provides transport. PCP is Dr Emily Filbert. [facility]ses [facility] on [address] and has no issues obtaining medications.                  Action/Plan: Notified [facility] hat patient was here. Will look to place resumption orders at discharge. RNCM to continue to follow for any needs.  Expected Discharge Date:                  Expected Discharge Plan:     In-House Referral:     Discharge planning Services     Post Acute Care Choice:    Choice offered to:     DME Arranged:    DME Agency:     HH Arranged:    HH Agency:     Status of Service:     If discussed at [employer], dates discussed:    Additional Comments:  Latanya Maudlin, RN 01/11/2018, 9:16 AM

## 2018-01-12 LAB — PROTIME-INR
INR: 1.52
PROTHROMBIN TIME: 18.2 s — AB (ref 11.4–15.2)

## 2018-01-12 LAB — POTASSIUM: POTASSIUM: 4.8 mmol/L (ref 3.5–5.1)

## 2018-01-12 MED ORDER — WARFARIN SODIUM 5 MG PO TABS
5.0000 mg | ORAL_TABLET | Freq: Once | ORAL | Status: DC
  Filled 2018-01-12: qty 1

## 2018-01-12 NOTE — Progress Notes (Signed)
Talked to Dr. Posey Pronto that Dr. Clayborn Bigness is at bedside to see patient, per Dr. Clayborn Bigness ok to discharge as heart rate will probably will not make a difference if patient stays or discharge. Dr. Posey Pronto will place discharge order, will ambulate patient to hallway prior to discharge. RN will continue to monitor.

## 2018-01-12 NOTE — Progress Notes (Signed)
Went over discharge instructions including medications and follow-up appointment with the patient and wife. Discontinue peripheral IV and telemetry monitor. NT and RN helped patient for transport.

## 2018-01-12 NOTE — Progress Notes (Signed)
Kyle Gregory at [place] NAME: Kyle Gregory    MR#:  [id number]  DATE OF BIRTH:  11/14/1940  SUBJECTIVE:  patient has baseline dementia. He is denying any complaints. He said he slept good. He wants to go home. His heart rate is anywhere from 30 to 55.  REVIEW OF SYSTEMS:   Review of Systems  Constitutional: Negative for chills, fever and weight loss.  HENT: Negative for ear discharge, ear pain and nosebleeds.   Eyes: Negative for blurred vision, pain and discharge.  Respiratory: Negative for sputum production, shortness of breath, wheezing and stridor.   Cardiovascular: Negative for chest pain, palpitations, orthopnea and PND.  Gastrointestinal: Negative for abdominal pain, diarrhea, nausea and vomiting.  Genitourinary: Negative for frequency and urgency.  Musculoskeletal: Negative for back pain and joint pain.  Neurological: Negative for sensory change, speech change, focal weakness and weakness.  Psychiatric/Behavioral: Negative for depression and hallucinations. The patient is not nervous/anxious.    Tolerating Diet:yes Tolerating PT: ambulatory  DRUG ALLERGIES:   Allergies  Allergen Reactions  . Amiodarone     Other reaction(s): Dizziness  . Donepezil Palpitations    Severe bradycardia    VITALS:  Blood pressure 124/65, pulse (!) 46, temperature (!) 97.5 F (36.4 C), temperature source Oral, resp. rate 18, height 6' (1.829 m), weight 84.8 kg, SpO2 99 %.  PHYSICAL EXAMINATION:   Physical Exam  GENERAL:  77 y.o.-year-old patient lying in the bed with no acute distress.  EYES: Pupils equal, round, reactive to light and accommodation. No scleral icterus. Extraocular muscles intact.  HEENT: Head atraumatic, normocephalic. Oropharynx and nasopharynx clear.  NECK:  Supple, no jugular venous distention. No thyroid enlargement, no tenderness.  LUNGS: Normal breath sounds bilaterally, no wheezing, rales, rhonchi. No use of  accessory muscles of respiration.  CARDIOVASCULAR: S1, S2 normal. No murmurs, rubs, or gallops. With intermittent bradycardia ABDOMEN: Soft, nontender, nondistended. Bowel sounds present. No organomegaly or mass.  EXTREMITIES: No cyanosis, clubbing or edema b/l.    NEUROLOGIC: Cranial nerves II through XII are intact. No focal Motor or sensory deficits b/l.   PSYCHIATRIC:  patient is alert and oriented x 2.  SKIN: No obvious rash, lesion, or ulcer.   LABORATORY PANEL:  CBC Recent Labs  Lab 01/11/18 1051  WBC 4.5  HGB 12.9*  HCT 38.6*  PLT 122*    Chemistries  Recent Labs  Lab 01/11/18 1620 01/12/18 0839  NA 132*  --   K 5.8* 4.8  CL 106  --   CO2 22  --   GLUCOSE 95  --   BUN 33*  --   CREATININE 1.26*  --   CALCIUM 8.6*  --    Cardiac Enzymes Recent Labs  Lab 01/10/18 1729  TROPONINI 0.06*   RADIOLOGY:  Dg Chest 2 View  Result Date: 01/10/2018 CLINICAL DATA:  Patient with hypotension and bradycardia. EXAM: CHEST - 2 VIEW COMPARISON:  Chest radiograph 09/03/2017 FINDINGS: Monitoring leads overlie the patient. Cardiomegaly. Aortic atherosclerosis. Small left pleural effusion and left basilar opacities. No pneumothorax. Thoracic spine degenerative changes. IMPRESSION: Cardiomegaly. Probable small left pleural effusion and left basilar opacities favored to represent atelectasis. Electronically Signed   By: Kyle Gregory M.D.   On: 01/10/2018 18:54   ASSESSMENT AND PLAN:  77 year old male with a history of chronic atrial fibrillation, aortic valve replacement and CAD who presents with low blood pressure and bradycardia.  1. Bradycardia--recurrent Continue telemetry monitoring HR 30-50's nml TSH  [facility] cardiology consultation requested--dr Kyle Gregory Stopped amiodarone and beta-blocker was discontinued by primary care physician recently D/ced aricept  2. Hypotension: Continue IV fluids Hold blood pressure medications  3. Chronic atrial fibrillation: Stopped  amiodarone Continue anticoagulation with pharmacy dosing  4. CAD: Continue statin  5. h/o Aortic valve replacement: Continue Coumadin  6. Iron deficiency anemia: Continue ferrous sulfate   Case discussed with Care Management/Social Worker. Management plans discussed with the patient, family and they are in agreement.  CODE STATUS: *DNR DVT Prophylaxis: coumadin  TOTAL TIME TAKING CARE OF THIS PATIENT: *25* minutes.  >50% time spent on counselling and coordination of care  POSSIBLE D/C IN *1-2* DAYS, DEPENDING ON CLINICAL CONDITION.  Note: This dictation was prepared with Dragon dictation along with smaller phrase technology. Any transcriptional errors that result from this process are unintentional.  Kyle Gregory M.D on 01/12/2018 at 3:12 PM  Between 7am to 6pm - Pager - [phone]  After 6pm go to [email or web address] - Proofreader  [facility]  Office  [phone]  CC: Primary care physician; Kyle Gregory, MDPatient ID: Kyle Gregory, male   DOB: 1941-04-18, 77 y.o.   MRN: [id number]

## 2018-01-12 NOTE — Progress Notes (Signed)
CCMD called to notify patient's heart rate keeps trending in the 30s-40s. Patient sitting up in the chair, sleeping. Dr. Posey Pronto notified, plan to keep him another day. Will notify wife. Will keep monitoring patient.

## 2018-01-12 NOTE — Discharge Summary (Signed)
[place] at [place] NAME: Kyle Gregory    MR#:  [id number]  DATE OF BIRTH:  07-Jun-1940  DATE OF ADMISSION:  01/10/2018 ADMITTING PHYSICIAN: Bettey Costa, MD  DATE OF DISCHARGE: 01/12/2018  PRIMARY CARE PHYSICIAN: Rusty Aus, MD    ADMISSION DIAGNOSIS:  Bradycardia [R00.1] Elevated troponin [R74.8] Hypotension, unspecified hypotension type [I95.9]  DISCHARGE DIAGNOSIS:  Recurrent Bradycardia  SECONDARY DIAGNOSIS:   Past Medical History:  Diagnosis Date  . Anemia   . Aortic stenosis   . Arrhythmia   . Atrial fibrillation ([place])   . Cardiomyopathy ([place])   . CHF (congestive heart failure) ([place])   . COPD (chronic obstructive pulmonary disease) ([place])   . Heartburn   . Stroke [facility])     HOSPITAL COURSE:   77 year old male with a history of chronic atrial fibrillation, aortic valve replacement and CAD who presents with low blood pressure and bradycardia.  1. Bradycardia--recurrent Continue telemetry monitoring HR 40-55's [facility] cardiology consultation requested--dr callwood Stopped amiodaroneandbeta-blocker was discontinued by primary care physician recently D/ced aricept also Pt currently asymptomatic and no urgent indication for PM placement per cardiology  2. Hypotension: recieved  IV fluids BP stable  3. Chronic atrial fibrillation: Stopped amiodarone Continue anticoagulation with pharmacy dosing  4. CAD: Continue statin  5. h/o Aortic valve replacement: Continue Coumadin  6. Iron deficiency anemia: Continue ferrous sulfate  D/w Mrs Rufo and pt will f/u Dr Ubaldo Glassing in 3-4 days [facility] services resumed.  D/c home CONSULTS OBTAINED:  Treatment Team:  Yolonda Kida, MD  DRUG ALLERGIES:   Allergies  Allergen Reactions  . Amiodarone     Other reaction(s): Dizziness  . Donepezil Palpitations    Severe bradycardia    DISCHARGE MEDICATIONS:   Allergies as of 01/12/2018      Reactions    Amiodarone    Other reaction(s): Dizziness   Donepezil Palpitations   Severe bradycardia      Medication List    STOP taking these medications   amiodarone 200 MG tablet Commonly known as:  PACERONE   sulfamethoxazole-trimethoprim 800-160 MG tablet Commonly known as:  BACTRIM DS,SEPTRA DS     TAKE these medications   [facility] ZINC OXIDE 20 % ointment Generic drug:  zinc oxide Apply 1 application topically as needed for irritation.   ferrous sulfate 325 (65 FE) MG tablet Take 325 mg by mouth 2 (two) times daily with a meal.   FLUoxetine 20 MG capsule Commonly known as:  PROZAC Take 20 mg by mouth daily.   furosemide 40 MG tablet Commonly known as:  LASIX Take 1 tablet (40 mg total) by mouth daily.   Melatonin 3 MG Tabs Take 1 tablet by mouth at bedtime.   nitroGLYCERIN 0.4 MG SL tablet Commonly known as:  NITROSTAT Place 0.4 mg under the tongue every 5 (five) minutes as needed for chest pain (Up to 3 doses).   omeprazole 20 MG capsule Commonly known as:  PRILOSEC Take 20 mg by mouth daily as needed (Heartburn or acid reflux).   OYSTER SHELL CALCIUM 500 + D 500-125 MG-UNIT Tabs Generic drug:  Calcium Carbonate-Vitamin D Take 1 tablet by mouth daily.   pravastatin 40 MG tablet Commonly known as:  PRAVACHOL Take 1 tablet by mouth at bedtime.   spironolactone 25 MG tablet Commonly known as:  ALDACTONE Take 1 tablet by mouth daily.   warfarin 2 MG tablet Commonly known as:  COUMADIN Take 3 mg by mouth  at bedtime.       If you experience worsening of your admission symptoms, develop shortness of breath, life threatening emergency, suicidal or homicidal thoughts you must seek medical attention immediately by calling 911 or calling your MD immediately  if symptoms less severe.  You Must read complete instructions/literature along with all the possible adverse reactions/side effects for all the Medicines you take and that have been prescribed to you. Take any new  Medicines after you have completely understood and accept all the possible adverse reactions/side effects.   Please note  You were cared for by a hospitalist during your hospital stay. If you have any questions about your discharge medications or the care you received while you were in the hospital after you are discharged, you can call the unit and asked to speak with the hospitalist on call if the hospitalist that took care of you is not available. Once you are discharged, your primary care physician will handle any further medical issues. Please note that NO REFILLS for any discharge medications will be authorized once you are discharged, as it is imperative that you return to your primary care physician (or establish a relationship with a primary care physician if you do not have one) for your aftercare needs so that they can reassess your need for medications and monitor your lab values.   DATA REVIEW:   CBC  Recent Labs  Lab 01/11/18 1051  WBC 4.5  HGB 12.9*  HCT 38.6*  PLT 122*    Chemistries  Recent Labs  Lab 01/11/18 1620 01/12/18 0839  NA 132*  --   K 5.8* 4.8  CL 106  --   CO2 22  --   GLUCOSE 95  --   BUN 33*  --   CREATININE 1.26*  --   CALCIUM 8.6*  --     Microbiology Results   Recent Results (from the past 240 hour(s))  Microscopic Examination     Status: Abnormal   Collection Time: 01/03/18 11:03 AM  Result Value Ref Range Status   WBC, UA 0-5 0 - 5 /hpf Final   RBC, UA 0-2 0 - 2 /hpf Final   Epithelial Cells (non renal) 0-10 0 - 10 /hpf Final   Casts Present (A) None seen /lpf Final   Cast Type Hyaline casts N/A Final   Mucus, UA Present (A) Not Estab. Final   Bacteria, UA Few (A) None seen/Few Final    RADIOLOGY:  Dg Chest 2 View  Result Date: 01/10/2018 CLINICAL DATA:  Patient with hypotension and bradycardia. EXAM: CHEST - 2 VIEW COMPARISON:  Chest radiograph 09/03/2017 FINDINGS: Monitoring leads overlie the patient. Cardiomegaly. Aortic  atherosclerosis. Small left pleural effusion and left basilar opacities. No pneumothorax. Thoracic spine degenerative changes. IMPRESSION: Cardiomegaly. Probable small left pleural effusion and left basilar opacities favored to represent atelectasis. Electronically Signed   By: Lovey Newcomer M.D.   On: 01/10/2018 18:54     Management plans discussed with the patient, family and they are in agreement.  CODE STATUS:     Code Status Orders  (From admission, onward)         Start     Ordered   01/10/18 1806  Do not attempt resuscitation (DNR)  Continuous    Question Answer Comment  In the event of cardiac or respiratory ARREST Do not call a "code blue"   In the event of cardiac or respiratory ARREST Do not perform Intubation, CPR, defibrillation or ACLS  In the event of cardiac or respiratory ARREST Use medication by any route, position, wound care, and other measures to relive pain and suffering. May use oxygen, suction and manual treatment of airway obstruction as needed for comfort.      01/10/18 1805        Code Status History    Date Active Date Inactive Code Status Order ID Comments User Context   11/10/2017 1705 11/11/2017 1750 DNR [id number]  Hillary Bow, MD ED   09/03/2017 0617 09/04/2017 1600 Full Code [id number]  Harrie Foreman, MD Inpatient   04/17/2017 1310 04/19/2017 1711 Full Code [id number]  Epifanio Lesches, MD ED      TOTAL TIME TAKING CARE OF THIS PATIENT: *40* minutes.    Fritzi Mandes M.D on 01/12/2018 at 4:47 PM  Between 7am to 6pm - Pager - [phone] After 6pm go to [email or web address] - password [email or web address] [place] [facility]  Office  [phone]  CC: Primary care physician; Rusty Aus, MD

## 2018-01-12 NOTE — Progress Notes (Signed)
Patient ID: Kyle Gregory, male   DOB: 01/13/1941, 77 y.o.   MRN: [id number] Spoke with Dr Clayborn Bigness. Pt not a good candidate for PM currently. HR remains in 45-50's when awake. Ok to go home per Cardiology D/w RN to ambulate pt. Wife informed by cardiology per RN

## 2018-01-18 ENCOUNTER — Encounter
Admission: RE | Admit: 2018-01-18 | Discharge: 2018-01-18 | Disposition: A | Discharge status: Home / Self Care | Payer: Medicare Other | Source: Ambulatory Visit | Attending: Urology | Admitting: Urology

## 2018-01-18 ENCOUNTER — Other Ambulatory Visit: Payer: Self-pay

## 2018-01-18 DIAGNOSIS — D509 Iron deficiency anemia, unspecified: Secondary | ICD-10-CM | POA: Diagnosis not present

## 2018-01-18 DIAGNOSIS — I959 Hypotension, unspecified: Secondary | ICD-10-CM | POA: Insufficient documentation

## 2018-01-18 DIAGNOSIS — I482 Chronic atrial fibrillation: Secondary | ICD-10-CM | POA: Diagnosis not present

## 2018-01-18 DIAGNOSIS — R001 Bradycardia, unspecified: Secondary | ICD-10-CM | POA: Diagnosis not present

## 2018-01-18 DIAGNOSIS — I2510 Atherosclerotic heart disease of native coronary artery without angina pectoris: Secondary | ICD-10-CM | POA: Diagnosis not present

## 2018-01-18 DIAGNOSIS — Z01812 Encounter for preprocedural laboratory examination: Secondary | ICD-10-CM | POA: Insufficient documentation

## 2018-01-18 LAB — URINALYSIS, ROUTINE W REFLEX MICROSCOPIC
BILIRUBIN URINE: NEGATIVE
GLUCOSE, UA: NEGATIVE mg/dL
KETONES UR: NEGATIVE mg/dL
LEUKOCYTES UA: NEGATIVE
NITRITE: NEGATIVE
PH: 6 (ref 5.0–8.0)
Protein, ur: NEGATIVE mg/dL
Specific Gravity, Urine: 1.016 (ref 1.005–1.030)

## 2018-01-18 LAB — BASIC METABOLIC PANEL
ANION GAP: 9 (ref 5–15)
BUN: 26 mg/dL — ABNORMAL HIGH (ref 8–23)
CHLORIDE: 99 mmol/L (ref 98–111)
CO2: 27 mmol/L (ref 22–32)
Calcium: 9 mg/dL (ref 8.9–10.3)
Creatinine, Ser: 1.14 mg/dL (ref 0.61–1.24)
GFR calc non Af Amer: >60 mL/min (ref >60)
Glucose, Bld: 91 mg/dL (ref 70–99)
POTASSIUM: 4.4 mmol/L (ref 3.5–5.1)
Sodium: 135 mmol/L (ref 135–145)

## 2018-01-18 LAB — CBC
HCT: 41.5 % (ref 40.0–52.0)
Hemoglobin: 13.9 g/dL (ref 13.0–18.0)
MCH: 31.2 pg (ref 26.0–34.0)
MCHC: 33.4 g/dL (ref 32.0–36.0)
MCV: 93.4 fL (ref 80.0–100.0)
Platelets: 136 10*3/uL — ABNORMAL LOW (ref 150–440)
RBC: 4.44 MIL/uL (ref 4.40–5.90)
RDW: 15.1 % — ABNORMAL HIGH (ref 11.5–14.5)
WBC: 4.9 10*3/uL (ref 3.8–10.6)

## 2018-01-18 NOTE — Patient Instructions (Signed)
Your procedure is scheduled on: Friday 01/25/18   Report to [place]. To find out your arrival time please call [phone] between 1PM - 3PM on Thursday 01/24/18  Remember: Instructions that are not followed completely may result in serious medical risk, up to and including death, or upon the discretion of your surgeon and anesthesiologist your surgery may need to be rescheduled.     _X__ 1. Do not eat food after midnight the night before your procedure.                 No gum chewing or hard candies. You may drink clear liquids up to 2 hours                 before you are scheduled to arrive for your surgery- DO NOT drink clear                 liquids within 2 hours of the start of your surgery.                 Clear Liquids include:  water, apple juice without pulp, clear carbohydrate                 drink such as Clearfast or Gatorade, Black Coffee or Tea (Do not add                 anything to coffee or tea).  __X__2.  On the morning of surgery brush your teeth with toothpaste and water, you may rinse your mouth with mouthwash if you wish.  Do not swallow any toothpaste or mouthwash.     _X__ 3.  No Alcohol for 24 hours before or after surgery.   _X__ 4.  Do Not Smoke or use e-cigarettes For 24 Hours Prior to Your Surgery.                 Do not use any chewable tobacco products for at least 6 hours prior to                 surgery.  ____  5.  Bring all medications with you on the day of surgery if instructed.   __X__  6.  Notify your doctor if there is any change in your medical condition      (cold, fever, infections).     Do not wear jewelry, make-up, hairpins, clips or nail polish. Do not wear lotions, powders, or perfumes.  Do not shave 48 hours prior to surgery. Men may shave face and neck. Do not bring valuables to the hospital.    [facility] is not responsible for any belongings or valuables.  Contacts,  dentures/partials or body piercings may not be worn into surgery. Bring a case for your contacts, glasses or hearing aids, a denture cup will be supplied. Leave your suitcase in the car. After surgery it may be brought to your room. For patients admitted to the hospital, discharge time is determined by your treatment team.   Patients discharged the day of surgery will not be allowed to drive home.   Please read over the following fact sheets that you were given:   MRSA Information  __X__ Take these medicines the morning of surgery with A SIP OF WATER:     1. ferrous sulfate 325 (65 FE) MG tablet  2. FLUoxetine (PROZAC) 20 MG capsule  3. omeprazole (PRILOSEC) 20 MG capsule  4.  5.  6.   __X__ Stop Blood Thinners: Coumadin. Last dose Saturday 01/19/18.  __X__ Stop Anti-inflammatories 7 days before surgery such as Advil, Ibuprofen, Motrin, BC or Goodies Powder, Naprosyn, Naproxen, Aleve, Aspirin, Meloxicam. May take Tylenol if needed for pain or discomfort.

## 2018-01-21 ENCOUNTER — Telehealth: Payer: Self-pay

## 2018-01-21 ENCOUNTER — Other Ambulatory Visit: Payer: Self-pay | Admitting: Radiology

## 2018-01-21 DIAGNOSIS — C679 Malignant neoplasm of bladder, unspecified: Secondary | ICD-10-CM

## 2018-01-21 DIAGNOSIS — B952 Enterococcus as the cause of diseases classified elsewhere: Secondary | ICD-10-CM

## 2018-01-21 DIAGNOSIS — N390 Urinary tract infection, site not specified: Principal | ICD-10-CM

## 2018-01-21 LAB — URINE CULTURE: Culture: 30000 — AB

## 2018-01-21 MED ORDER — AMOXICILLIN 500 MG PO TABS: 500.0000 mg | ORAL_TABLET | Freq: Two times a day (BID) | ORAL | 0 refills | Status: DC

## 2018-01-21 NOTE — Telephone Encounter (Signed)
Called pt informed him of urine cx results. Pt gave verbal understanding. RX sent in.

## 2018-01-21 NOTE — Telephone Encounter (Signed)
-----   Message from Billey Co, MD sent at 01/21/2018  1:14 PM EDT ----- Regarding: abx pre-op Please start him on Amoxicillin 500mg  BID starting today for 5 days total for his positive urine cx with surgery scheduled this Friday. Thanks  Nickolas Madrid, MD 01/21/2018  ----- Message ----- From: Interface, Lab In [place] Sent: 01/18/2018  11:43 AM EDT To: Billey Co, MD

## 2018-01-24 MED ORDER — CIPROFLOXACIN IN D5W 400 MG/200ML IV SOLN
400.0000 mg | INTRAVENOUS | Status: AC
  Administered 2018-01-25: 400 mg via INTRAVENOUS

## 2018-01-25 ENCOUNTER — Ambulatory Visit: Payer: Medicare Other | Admitting: Certified Registered"

## 2018-01-25 ENCOUNTER — Encounter
Admission: RE | Disposition: A | Discharge status: Home / Self Care | Payer: Self-pay | Source: Ambulatory Visit | Attending: Urology

## 2018-01-25 ENCOUNTER — Telehealth: Payer: Self-pay | Admitting: Urology

## 2018-01-25 ENCOUNTER — Other Ambulatory Visit: Payer: Self-pay

## 2018-01-25 ENCOUNTER — Ambulatory Visit
Admission: RE | Admit: 2018-01-25 | Discharge: 2018-01-25 | Disposition: A | Discharge status: Home / Self Care | Payer: Medicare Other | Source: Ambulatory Visit | Attending: Urology | Admitting: Urology

## 2018-01-25 DIAGNOSIS — I429 Cardiomyopathy, unspecified: Secondary | ICD-10-CM | POA: Diagnosis not present

## 2018-01-25 DIAGNOSIS — C679 Malignant neoplasm of bladder, unspecified: Secondary | ICD-10-CM

## 2018-01-25 DIAGNOSIS — I4891 Unspecified atrial fibrillation: Secondary | ICD-10-CM | POA: Diagnosis not present

## 2018-01-25 DIAGNOSIS — I2510 Atherosclerotic heart disease of native coronary artery without angina pectoris: Secondary | ICD-10-CM | POA: Diagnosis not present

## 2018-01-25 DIAGNOSIS — Z8551 Personal history of malignant neoplasm of bladder: Secondary | ICD-10-CM | POA: Diagnosis not present

## 2018-01-25 DIAGNOSIS — C678 Malignant neoplasm of overlapping sites of bladder: Secondary | ICD-10-CM | POA: Diagnosis not present

## 2018-01-25 DIAGNOSIS — Z8673 Personal history of transient ischemic attack (TIA), and cerebral infarction without residual deficits: Secondary | ICD-10-CM | POA: Insufficient documentation

## 2018-01-25 DIAGNOSIS — F0390 Unspecified dementia without behavioral disturbance: Secondary | ICD-10-CM | POA: Diagnosis not present

## 2018-01-25 DIAGNOSIS — Z952 Presence of prosthetic heart valve: Secondary | ICD-10-CM | POA: Insufficient documentation

## 2018-01-25 DIAGNOSIS — D494 Neoplasm of unspecified behavior of bladder: Secondary | ICD-10-CM

## 2018-01-25 DIAGNOSIS — J449 Chronic obstructive pulmonary disease, unspecified: Secondary | ICD-10-CM | POA: Insufficient documentation

## 2018-01-25 DIAGNOSIS — Z951 Presence of aortocoronary bypass graft: Secondary | ICD-10-CM | POA: Diagnosis not present

## 2018-01-25 HISTORY — PX: TRANSURETHRAL RESECTION OF BLADDER TUMOR: SHX2575

## 2018-01-25 HISTORY — DX: Carrier of other specified bacterial diseases: Z22.39

## 2018-01-25 SURGERY — TURBT (TRANSURETHRAL RESECTION OF BLADDER TUMOR)
Anesthesia: General

## 2018-01-25 MED ORDER — FENTANYL CITRATE (PF) 100 MCG/2ML IJ SOLN
INTRAMUSCULAR | Status: DC | PRN
  Administered 2018-01-25: 50 ug via INTRAVENOUS

## 2018-01-25 MED ORDER — ROCURONIUM BROMIDE 100 MG/10ML IV SOLN
INTRAVENOUS | Status: DC | PRN
  Administered 2018-01-25: 30 mg via INTRAVENOUS

## 2018-01-25 MED ORDER — CIPROFLOXACIN IN D5W 400 MG/200ML IV SOLN
INTRAVENOUS | Status: AC
  Filled 2018-01-25: qty 200

## 2018-01-25 MED ORDER — ETOMIDATE 2 MG/ML IV SOLN
INTRAVENOUS | Status: AC
  Filled 2018-01-25: qty 10

## 2018-01-25 MED ORDER — PHENYLEPHRINE HCL 10 MG/ML IJ SOLN
INTRAMUSCULAR | Status: AC
  Filled 2018-01-25: qty 1

## 2018-01-25 MED ORDER — ONDANSETRON HCL 4 MG/2ML IJ SOLN
INTRAMUSCULAR | Status: AC
  Filled 2018-01-25: qty 2

## 2018-01-25 MED ORDER — GEMCITABINE CHEMO FOR BLADDER INSTILLATION 2000 MG
2000.0000 mg | Freq: Once | INTRAVENOUS | Status: DC
  Filled 2018-01-25: qty 52.6

## 2018-01-25 MED ORDER — FENTANYL CITRATE (PF) 100 MCG/2ML IJ SOLN: 25.0000 ug | INTRAMUSCULAR | Status: DC | PRN

## 2018-01-25 MED ORDER — FENTANYL CITRATE (PF) 100 MCG/2ML IJ SOLN
INTRAMUSCULAR | Status: AC
  Filled 2018-01-25: qty 2

## 2018-01-25 MED ORDER — PROPOFOL 10 MG/ML IV BOLUS
INTRAVENOUS | Status: AC
  Filled 2018-01-25: qty 20

## 2018-01-25 MED ORDER — ETOMIDATE 2 MG/ML IV SOLN
INTRAVENOUS | Status: DC | PRN
  Administered 2018-01-25: 16 mg via INTRAVENOUS

## 2018-01-25 MED ORDER — LIDOCAINE HCL (PF) 2 % IJ SOLN
INTRAMUSCULAR | Status: AC
  Filled 2018-01-25: qty 10

## 2018-01-25 MED ORDER — ONDANSETRON HCL 4 MG/2ML IJ SOLN: 4.0000 mg | Freq: Once | INTRAMUSCULAR | Status: DC | PRN

## 2018-01-25 MED ORDER — SUGAMMADEX SODIUM 200 MG/2ML IV SOLN
INTRAVENOUS | Status: DC | PRN
  Administered 2018-01-25: 170 mg via INTRAVENOUS

## 2018-01-25 MED ORDER — SUGAMMADEX SODIUM 200 MG/2ML IV SOLN
INTRAVENOUS | Status: AC
  Filled 2018-01-25: qty 2

## 2018-01-25 MED ORDER — LACTATED RINGERS IV SOLN
INTRAVENOUS | Status: DC
  Administered 2018-01-25: 06:00:00 via INTRAVENOUS

## 2018-01-25 MED ORDER — LIDOCAINE HCL (CARDIAC) PF 100 MG/5ML IV SOSY
PREFILLED_SYRINGE | INTRAVENOUS | Status: DC | PRN
  Administered 2018-01-25: 100 mg via INTRAVENOUS

## 2018-01-25 SURGICAL SUPPLY — 29 items
BAG DRAIN CYSTO-URO LG1000N (MISCELLANEOUS) ×3 IMPLANT
BAG URINE DRAINAGE (UROLOGICAL SUPPLIES) ×3 IMPLANT
BRUSH SCRUB EZ  4% CHG (MISCELLANEOUS) ×2
BRUSH SCRUB EZ 4% CHG (MISCELLANEOUS) ×1 IMPLANT
CATH FOL 2WAY LX 20X30 (CATHETERS) ×2 IMPLANT
CATH FOLEY 2WAY  5CC 16FR (CATHETERS) ×2
CATH URTH 16FR FL 2W BLN LF (CATHETERS) ×1 IMPLANT
CRADLE LAMINECT ARM (MISCELLANEOUS) ×3 IMPLANT
DRAPE UTILITY 15X26 TOWEL STRL (DRAPES) ×3 IMPLANT
DRSG TELFA 4X3 1S NADH ST (GAUZE/BANDAGES/DRESSINGS) ×3 IMPLANT
ELECT LOOP 22F BIPOLAR SML (ELECTROSURGICAL)
ELECT REM PT RETURN 9FT ADLT (ELECTROSURGICAL)
ELECTRODE LOOP 22F BIPOLAR SML (ELECTROSURGICAL) IMPLANT
ELECTRODE REM PT RTRN 9FT ADLT (ELECTROSURGICAL) IMPLANT
GLOVE BIO SURGEON STRL SZ 6.5 (GLOVE) ×1 IMPLANT
GLOVE BIO SURGEONS STRL SZ 6.5 (GLOVE)
GOWN STRL REUS W/ TWL LRG LVL3 (GOWN DISPOSABLE) ×2 IMPLANT
GOWN STRL REUS W/TWL LRG LVL3 (GOWN DISPOSABLE) ×4
HOLDER FOLEY CATH W/STRAP (MISCELLANEOUS) ×2 IMPLANT
IV NS IRRIG 3000ML ARTHROMATIC (IV SOLUTION) ×6 IMPLANT
KIT TURNOVER CYSTO (KITS) ×3 IMPLANT
LOOP CUT BIPOLAR 24F LRG (ELECTROSURGICAL) ×2 IMPLANT
NDL SAFETY ECLIPSE 18X1.5 (NEEDLE) ×1 IMPLANT
NEEDLE HYPO 18GX1.5 SHARP (NEEDLE)
PACK CYSTO AR (MISCELLANEOUS) ×3 IMPLANT
SET IRRIG Y TYPE TUR BLADDER L (SET/KITS/TRAYS/PACK) ×3 IMPLANT
SURGILUBE 2OZ TUBE FLIPTOP (MISCELLANEOUS) ×3 IMPLANT
SYRINGE IRR TOOMEY STRL 70CC (SYRINGE) ×3 IMPLANT
WATER STERILE IRR 1000ML POUR (IV SOLUTION) ×3 IMPLANT

## 2018-01-25 NOTE — H&P (Signed)
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 CT 12/20/2017 with no evidence of metastatic disease or hydronephrosis.  Cardiac: Irregularly irregular Lungs: CTA bilaterally  Laterality: N/A Procedure: TURBT  Urine: 9/20: 30k enterococcus, has been on one week culture appropriate Amoxicillin   Informed consent obtained, we specifically discussed the risks of bleeding, infection, bladder perforation, possible need for catheter, need for additional procedures, and prolonged hospitalization.  Billey Co, MD 01/25/2018

## 2018-01-25 NOTE — Anesthesia Procedure Notes (Signed)
Procedure Name: Intubation Performed by: Lance Muss, CRNA Pre-anesthesia Checklist: Patient identified, Patient being monitored, Timeout performed, Emergency Drugs available and Suction available Patient Re-evaluated:Patient Re-evaluated prior to induction Oxygen Delivery Method: Circle system utilized Preoxygenation: Pre-oxygenation with 100% oxygen Induction Type: IV induction Ventilation: Mask ventilation without difficulty Laryngoscope Size: Mac and 3 Grade View: Grade II Tube type: Oral Tube size: 7.5 mm Number of attempts: 1 Airway Equipment and Method: Stylet Placement Confirmation: ETT inserted through vocal cords under direct vision,  positive ETCO2 and breath sounds checked- equal and bilateral Secured at: 23 cm Tube secured with: Tape Dental Injury: Teeth and Oropharynx as per pre-operative assessment and Injury to lip

## 2018-01-25 NOTE — Op Note (Signed)
Date of procedure: 01/25/18  Preoperative diagnosis:  1. Bladder cancer  Postoperative diagnosis:  1. Same  Procedure: 1. Cystoscopy, transurethral resection of bladder tumor  2. Instillation of gemcitabine  Surgeon: Nickolas Madrid, MD  Anesthesia: General  Complications: None  Intraoperative findings:  1.  Short, nonobstructive prostate  2.  Multiple papillary appearing bladder tumors.  2 cm patch at right lateral wall and right anterior wall.  No involvement of ureteral orifices. 3.  Excellent hemostasis  EBL: Minimal  Specimens: Bladder tumor  Drains: 20 French two-way Foley  Indication: Kyle Gregory is a 77 y.o. 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  After reviewing the management options for treatment, they elected to proceed with the above surgical procedure(s). We have discussed the potential benefits and risks of the procedure, side effects of the proposed treatment, the likelihood of the patient achieving the goals of the procedure, and any potential problems that might occur during the procedure or recuperation. Informed consent has been obtained.  Description of procedure:  The patient was taken to the operating room and general anesthesia was induced.  The patient was placed in the dorsal lithotomy position, prepped and draped in the usual sterile fashion, and preoperative antibiotics(Cipro) were administered. A preoperative time-out was performed.   65 French rigid cystoscope with a 30 degree lens was used to intubate the urethra.  The urethra was grossly normal with no strictures.  The prostate was short and nonobstructive.  Thorough cystoscopy was performed.  There was a patch of papillary appearing bladder tumor approximately 2 cm in size at the right lateral wall, as well as at the anterior right lateral wall.  There also 2 small papillary tumors  adjacent to the right ureteral orifice.  The visual obturator was then used to insert the resectoscope.  This was used to methodically resect the large tumors on the right side.  There did not appear to be any perforation.  The loop was then used to thoroughly fulgurate the base and surrounding area.  The loop was then used to remove the 2 small tumors around the right ureteral orifice, and the bases of these were fulgurated.  The right ureteral orifice was effluxing clearly at the conclusion of the procedure.  The tumor chips were irrigated free and sent for pathology.  The bladder was completely decompressed and no bleeding was noted.  The bladder was then refilled and the scope was removed.  A 20 [place] two-way Foley passed easily into the bladder with return of clear fluid.  This was connected to dependent drainage.  In the PACU, a total of 2 g of gemcitabine was instilled into the catheter and allowed to dwell for 1 hour.  The catheter was then unclamped and the medication was drained.  Disposition: Stable to PACU, home today  Plan: Follow-up in clinic on Tuesday 10/1 for Foley removal and void trial.  Follow-up pathology.  Consider additional induction course of BCG if high-grade tumor.   Nickolas Madrid, MD

## 2018-01-25 NOTE — Telephone Encounter (Signed)
Gave appt to [name] in post op   [name]

## 2018-01-25 NOTE — OR Nursing (Signed)
10:29 Catheter unclamped and drained and placed in chemo bin.  Now bag placed.  Wife instructed on catheter care and leg strap use

## 2018-01-25 NOTE — Progress Notes (Signed)
chemotherpy drug installed into bladder by dr Diamantina Providence

## 2018-01-25 NOTE — Telephone Encounter (Signed)
-----   Message from Billey Co, MD sent at 01/25/2018  9:02 AM EDT ----- Regarding: follow up Please schedule nurse visit for voiding trial on Tuesday 10/1.  Thanks- Nickolas Madrid, MD 01/25/2018

## 2018-01-25 NOTE — Discharge Instructions (Addendum)
Indwelling Urinary Catheter Care, Adult Take good care of your catheter to keep it working and to prevent problems. How to wear your catheter Attach your catheter to your leg with tape (adhesive tape) or a leg strap. Make sure it is not too tight. If you use tape, remove any bits of tape that are already on the catheter. How to wear a drainage bag You should have:  A large overnight bag.  A small leg bag.  Overnight Bag You may wear the overnight bag at any time. Always keep the bag below the level of your bladder but off the floor. When you sleep, put a clean plastic bag in a wastebasket. Then hang the bag inside the wastebasket. Leg Bag Never wear the leg bag at night. Always wear the leg bag below your knee. Keep the leg bag secure with a leg strap or tape. How to care for your skin  Clean the skin around the catheter at least once every day.  Shower every day. Do not take baths.  Put creams, lotions, or ointments on your genital area only as told by your doctor.  Do not use powders, sprays, or lotions on your genital area. How to clean your catheter and your skin 1. Wash your hands with soap and water. 2. Wet a washcloth in warm water and gentle (mild) soap. 3. Use the washcloth to clean the skin where the catheter enters your body. Clean downward and wipe away from the catheter in small circles. Do not wipe toward the catheter. 4. Pat the area dry with a clean towel. Make sure to clean off all soap. How to care for your drainage bags Empty your drainage bag when it is ?- full or at least 2-3 times a day. Replace your drainage bag once a month or sooner if it starts to smell bad or look dirty. Do not clean your drainage bag unless told by your doctor. Emptying a drainage bag  Supplies Needed  Rubbing alcohol.  Gauze pad or cotton ball.  Tape or a leg strap.  Steps 1. Wash your hands with soap and water. 2. Separate (detach) the bag from your leg. 3. Hold the bag over  the toilet or a clean container. Keep the bag below your hips and bladder. This stops pee (urine) from going back into the tube. 4. Open the pour spout at the bottom of the bag. 5. Empty the pee into the toilet or container. Do not let the pour spout touch any surface. 6. Put rubbing alcohol on a gauze pad or cotton ball. 7. Use the gauze pad or cotton ball to clean the pour spout. 8. Close the pour spout. 9. Attach the bag to your leg with tape or a leg strap. 10. Wash your hands.  Changing a drainage bag Supplies Needed  Alcohol wipes.  A clean drainage bag.  Adhesive tape or a leg strap.  Steps 1. Wash your hands with soap and water. 2. Separate the dirty bag from your leg. 3. Pinch the rubber catheter with your fingers so that pee does not spill out. 4. Separate the catheter tube from the drainage tube where these tubes connect (at the connection valve). Do not let the tubes touch any surface. 5. Clean the end of the catheter tube with an alcohol wipe. Use a different alcohol wipe to clean the end of the drainage tube. 6. Connect the catheter tube to the drainage tube of the clean bag. 7. Attach the new bag to  the leg with adhesive tape or a leg strap. 8. Wash your hands.  How to prevent infection and other problems  Never pull on your catheter or try to remove it. Pulling can damage tissue in your body.  Always wash your hands before and after touching your catheter.  If a leg strap gets wet, replace it with a dry one.  Drink enough fluids to keep your pee clear or pale yellow, or as told by your doctor.  Do not let the drainage bag or tubing touch the floor.  Wear cotton underwear.  If you are male, wipe from front to back after you poop (have a bowel movement).  Check on the catheter often to make sure it works and the tubing is not twisted. Get help if:  Your pee is cloudy.  Your pee smells unusually bad.  Your pee is not draining into the bag.  Your  tube gets clogged.  Your catheter starts to leak.  Your bladder feels full. Get help right away if:  You have redness, swelling, or pain where the catheter enters your body.  You have fluid, pus, or a bad smell coming from the area where the catheter enters your body.  The area where the catheter enters your body feels warm.  You have a fever.  You have pain in your: ? Stomach (abdomen). ? Legs. ? Lower back. ? Bladder.  You see blood fill the catheter.  Your pee is pink or red.  You feel sick to your stomach (nauseous).  You throw up (vomit).  You have chills.  Your catheter gets pulled out. This information is not intended to replace advice given to you by your health care provider. Make sure you discuss any questions you have with your health care provider. Document Released: 08/12/2012 Document Revised: 03/15/2016 Document Reviewed: 09/30/2013 Elsevier Interactive Patient Education  2018 [employer]   1) The drugs that you were given will stay in your system until tomorrow so for the next 24 hours you should not:  A) Drive an automobile B) Make any legal decisions C) Drink any alcoholic beverage   2) You may resume regular meals tomorrow.  Today it is better to start with liquids and gradually work up to solid foods.  You may eat anything you prefer, but it is better to start with liquids, then soup and crackers, and gradually work up to solid foods.   3) Please notify your doctor immediately if you have any unusual bleeding, trouble breathing, redness and pain at the surgery site, drainage, fever, or pain not relieved by medication.    4) Additional Instructions:        Please contact your physician with any problems or Same Day Surgery at [phone], Monday through Friday 6 am to 4 pm, or [place] [facility] number at [phone].     Transurethral Resection of Bladder  Tumor Transurethral resection of a bladder tumor is the removal (resection) of a cancerous growth (tumor) on the inside wall of the bladder. The bladder is the organ that holds urine. The tumor is removed through the tube that carries urine out of the body (urethra). In a transurethral resection, a thin telescope with a light, a tiny camera, and an electric cutting edge (resectoscope) is passed through the urethra. In men, the opening of the urethra is at the end of the penis. In women, it is just above the vaginal opening. Tell a health care provider  about:  Any allergies you have.  All medicines you are taking, including vitamins, herbs, eye drops, creams, and over-the-counter medicines.  Any problems you or family members have had with anesthetic medicines.  Any blood disorders you have.  Any surgeries you have had.  Any medical conditions you have.  Any recent urinary tract infections you have had.  Whether you are pregnant or may be pregnant. What are the risks? Generally, this is a safe procedure. However, problems may occur, including:  Infection.  Bleeding.  Allergic reactions to medicines.  Damage to other structures or organs, such as: ? The urethra. ? The tubes that drain urine from the kidneys into the bladder (ureters).  Pain and burning during urination.  Difficulty urinating due to partial blockage of the urethra.  Inability to urinate (urinary retention).  What happens before the procedure?  Follow instructions from your health care provider about eating and drinking restrictions.  Ask your health care provider about: ? Changing or stopping your regular medicines. This is especially important if you are taking diabetes medicines or blood thinners. ? Taking medicines such as aspirin and ibuprofen. These medicines can thin your blood. Do not take these medicines before your procedure if your health care provider instructs you not to.  You may have a  physical exam.  You may have tests, including: ? Blood tests. ? Urine tests. ? Electrocardiogram (ECG). This test measures the electrical activity of the heart.  You may be given antibiotic medicine to help prevent infection.  Ask your health care provider how your surgical site will be marked or identified.  Plan to have someone take you home after the procedure. What happens during the procedure?  To reduce your risk of infection: ? Your health care team will wash or sanitize their hands. ? Your skin will be washed with soap.  An IV tube will be inserted into one of your veins.  You will be given one or more of the following: ? A medicine to help you relax (sedative). ? A medicine to make you fall asleep (general anesthetic). ? A medicine that is injected into your spine to numb the area below and slightly above the injection site (spinal anesthetic).  Your legs will be placed in foot rests (stirrups) so that your legs are apart and your knees are bent.  The resectoscope will be passed through your urethra and into your bladder.  The part of your bladder that is affected by the tumor will be resected using the cutting edge of the resectoscope.  The resectoscope will be removed.  A thin, flexible tube (catheter) will be passed through your urethra and into your bladder. The catheter will drain urine into a bag outside of your body. ? Fluid may be passed through the catheter to keep the catheter open. The procedure may vary among health care providers and hospitals. What happens after the procedure?  Your blood pressure, heart rate, breathing rate, and blood oxygen level will be monitored often until the medicines you were given have worn off.  You may continue to receive fluids and medicines through an IV tube.  You will have some pain. Pain medicine will be available to help you.  You will have a catheter draining your urine. ? You will have blood in your urine. Your  catheter may be kept in until your urine is clear. ? Your urinary drainage will be monitored. If necessary, your bladder may be rinsed out (irrigated) by passing fluid through your  catheter.  You will be encouraged to walk around as soon as possible.  You may have to wear compression stockings. These stockings help to prevent blood clots and reduce swelling in your legs.  Do not drive for 24 hours if you received a sedative. This information is not intended to replace advice given to you by your health care provider. Make sure you discuss any questions you have with your health care provider. Document Released: 02/11/2009 Document Revised: 12/19/2015 Document Reviewed: 01/07/2015 Elsevier Interactive Patient Education  2018 [employer].

## 2018-01-25 NOTE — Anesthesia Postprocedure Evaluation (Signed)
Anesthesia Post Note  Patient: Kyle Gregory  Procedure(s) Performed: TRANSURETHRAL RESECTION OF BLADDER TUMOR (TURBT) (N/A )  Patient location during evaluation: PACU Anesthesia Type: General Level of consciousness: awake and alert and oriented Pain management: pain level controlled Vital Signs Assessment: post-procedure vital signs reviewed and stable Respiratory status: spontaneous breathing Cardiovascular status: blood pressure returned to baseline Anesthetic complications: no     Last Vitals:  Vitals:   01/25/18 0948 01/25/18 1046  BP: (!) 105/48 101/60  Pulse: 65 (!) 55  Resp: 16 16  Temp: 36.7 C 36.5 C  SpO2: 98% 96%    Last Pain:  Vitals:   01/25/18 1046  TempSrc: Oral  PainSc: 0-No pain                 Lanyiah Brix

## 2018-01-25 NOTE — H&P (Signed)
    PAOLA FLYNT 1940/09/28 [id number]  CC: Bladder cancer  HPI: Mr. Baggerly is a 77 year old M with CAD s/p CABG, Afib, and aortic valve replacement, as well as history of HG bladder cancer with new recurrence of bladder tumor on recent cystoscopy. He denies fevers, chills, flank pain.   PMH: Past Medical History:  Diagnosis Date  . Anemia   . Aortic stenosis   . Arrhythmia   . Atrial fibrillation ([place])   . Cardiomyopathy ([place])   . CHF (congestive heart failure) ([place])   . COPD (chronic obstructive pulmonary disease) ([place])   . Heartburn   . Stroke [facility])     Surgical History: Past Surgical History:  Procedure Laterality Date  . 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 CYSTOSCOPY W/ RETROGRADES Bilateral 08/14/2016   Procedure: CYSTOSCOPY WITH RETROGRADE PYELOGRAM;  Surgeon: Hollice Espy, MD;  Location: [facility] ORS;  Service: Urology;  Laterality: Bilateral;  . LEFT HEART CATH AND CORONARY ANGIOGRAPHY Left 09/05/2016   Procedure: Left Heart Cath and Coronary Angiography;  Surgeon: Teodoro Spray, MD;  Location: [facility] CV LAB;  Service: Cardiovascular;  Laterality: Left;  . neck growth removal     1970's  . TONSILLECTOMY    . TRANSURETHRAL RESECTION OF BLADDER TUMOR N/A 08/14/2016   Procedure: TRANSURETHRAL RESECTION OF BLADDER TUMOR (TURBT);  Surgeon: Hollice Espy, MD;  Location: [facility] ORS;  Service: Urology;  Laterality: N/A;    Allergies:  Allergies  Allergen Reactions  . Amiodarone     Other reaction(s): Dizziness  . Donepezil Palpitations    Severe bradycardia    Family History: Family History  Problem Relation Age of Onset  . Heart failure Father   . Heart failure Brother   . Atrial fibrillation Sister   . Prostate cancer Neg Hx   . Kidney cancer Neg Hx   . Bladder Cancer Neg Hx     Social History:  reports that he has never smoked. He has never used smokeless tobacco. He reports that he does not drink alcohol  or use drugs.  ROS: Please see flowsheet from today's date for complete review of systems.  Physical Exam: BP 122/86   Pulse 69   Temp 97.9 F (36.6 C) (Temporal)   Resp 18   SpO2 100%    Constitutional:  Alert and oriented, No acute distress. Cardiovascular: No clubbing, cyanosis, or edema. Respiratory: CTA bilaterally Heart: Irregularly irregular GI: Abdomen is soft, nontender, nondistended, no abdominal masses GU: No CVA tenderness Lymph: No cervical or inguinal lymphadenopathy. Skin: No rashes, bruises or suspicious lesions. Neurologic: Grossly intact, no focal deficits, moving all 4 extremities. Psychiatric: Normal mood and affect.  Laboratory Data: 9/20: 30k enterococcus, has been on one week culture appropriate Amoxicillin   CTU 11/2017 with no hydro or metastatic disease  Assessment & Plan:   77 yo M with history of HG bladder cancer, now with recurrence on cystoscopy and hematuria.  Proceed with TURBT today, will consider repeat course of induction BCG pending pathology.  Informed consent obtained, we specifically discussed the risks of bleeding, infection, bladder perforation, possible need for catheter, need for additional procedures, and prolonged hospitalization.  Billey Co, [facility] [address] [place], [place] [address] [phone]

## 2018-01-25 NOTE — Anesthesia Preprocedure Evaluation (Addendum)
Anesthesia Evaluation  Patient identified by MRN, date of birth, ID band Patient awake    Reviewed: Allergy & Precautions, H&P , NPO status , Patient's Chart, lab work & pertinent test results, reviewed documented beta blocker date and time   Airway Mallampati: II  TM Distance: >3 FB Neck ROM: full    Dental  (+) Teeth Intact   Pulmonary neg pulmonary ROS, COPD,    Pulmonary exam normal        Cardiovascular Exercise Tolerance: Good +CHF  negative cardio ROS Normal cardiovascular exam+ dysrhythmias Atrial Fibrillation + Valvular Problems/Murmurs AS  Rate:Normal     Neuro/Psych PSYCHIATRIC DISORDERS Dementia CVA negative neurological ROS  negative psych ROS   GI/Hepatic negative GI ROS, Neg liver ROS,   Endo/Other  negative endocrine ROS  Renal/GU negative Renal ROS  negative genitourinary   Musculoskeletal   Abdominal   Peds  Hematology negative hematology ROS (+) anemia ,   Anesthesia Other Findings Past Medical History: No date: Anemia No date: Aortic stenosis No date: Arrhythmia No date: Atrial fibrillation ([facility]) No date: Cardiomyopathy ([place]) No date: CHF (congestive heart failure) (H[facility]) No date: COPD (chronic obstructive pulmonary disease) ([facility]) No date: Heartburn No date: Stroke ([place])  EF 25%-35%  Reproductive/Obstetrics negative OB ROS                            Anesthesia Physical  Anesthesia Plan  ASA: III  Anesthesia Plan: General LMA   Post-op Pain Management:    Induction:   PONV Risk Score and Plan:   Airway Management Planned: LMA  Additional Equipment:   Intra-op Plan:   Post-operative Plan: Extubation in OR  Informed Consent: I have reviewed the patients History and Physical, chart, labs and discussed the procedure including the risks, benefits and alternatives for the proposed anesthesia with the patient or authorized representative who has indicated  his/her understanding and acceptance.     Plan Discussed with: CRNA  Anesthesia Plan Comments:         Anesthesia Quick Evaluation

## 2018-01-25 NOTE — Anesthesia Post-op Follow-up Note (Signed)
Anesthesia QCDR form completed.        

## 2018-01-25 NOTE — OR Nursing (Signed)
Patient's wife has copy of DNR and had questions regarding DNR and surgery.  DR Kayleen Memos explained at length with patient and wife.  Dr Diamantina Providence aware as well.  Patient and wife voiced understanding.

## 2018-01-25 NOTE — Transfer of Care (Signed)
Immediate Anesthesia Transfer of Care Note  Patient: Kyle Gregory  Procedure(s) Performed: TRANSURETHRAL RESECTION OF BLADDER TUMOR (TURBT) (N/A )  Patient Location: PACU  Anesthesia Type:General  Level of Consciousness: drowsy  Airway & Oxygen Therapy: Patient Spontanous Breathing and Patient connected to face mask oxygen  Post-op Assessment: Report given to RN and Post -op Vital signs reviewed and stable  Post vital signs: Reviewed and stable  Last Vitals:  Vitals Value Taken Time  BP 122/67 01/25/2018  8:49 AM  Temp    Pulse 62 01/25/2018  8:49 AM  Resp 13 01/25/2018  8:49 AM  SpO2 100 % 01/25/2018  8:49 AM  Vitals shown include unvalidated device data.  Last Pain:  Vitals:   01/25/18 0849  TempSrc: Temporal  PainSc:          Complications: No apparent anesthesia complications

## 2018-01-29 ENCOUNTER — Ambulatory Visit (INDEPENDENT_AMBULATORY_CARE_PROVIDER_SITE_OTHER): Payer: Medicare Other | Admitting: Urology

## 2018-01-29 DIAGNOSIS — C672 Malignant neoplasm of lateral wall of bladder: Secondary | ICD-10-CM

## 2018-01-29 LAB — SURGICAL PATHOLOGY

## 2018-01-29 NOTE — Progress Notes (Addendum)
Catheter Removal and Pathology discussion  Patient is present today for a catheter removal.  46ml of water was drained from the balloon. A 20FR foley cath was removed from the bladder no complications were noted . Patient tolerated well.  Preformed by: Elberta Leatherwood, CMA  I discussed his pathology from TURBT of primarily LG Ta non-invasive bladder cancer, with ~10% HG features. BCG on shortage and unavailable. I recommended induction course of Gemcitabine intravesically, with follow up clinic cystoscopy in 3 months.  Kyle Madrid, MD 01/29/2018   Bladder Scan Patient can void: 22ml Performed By: Elberta Leatherwood, [place]

## 2018-02-06 ENCOUNTER — Ambulatory Visit (INDEPENDENT_AMBULATORY_CARE_PROVIDER_SITE_OTHER): Payer: Medicare Other | Admitting: Family Medicine

## 2018-02-06 ENCOUNTER — Encounter: Payer: Self-pay | Admitting: Family Medicine

## 2018-02-06 VITALS — BP 86/55 | HR 108 | Ht 72.0 in | Wt 184.6 lb

## 2018-02-06 DIAGNOSIS — C672 Malignant neoplasm of lateral wall of bladder: Secondary | ICD-10-CM | POA: Diagnosis not present

## 2018-02-06 LAB — BLADDER SCAN AMB NON-IMAGING: SCAN RESULT: 32

## 2018-02-06 NOTE — Progress Notes (Signed)
Patient presents today with post surgery urinary frequency. He was unable to give a urine today in office. A PVR was performed with a residual of 32. His wife is concerned about his weak state. I informed her to make sure he is getting nutrition and with his low BP he will have weakness. If his urinary symptoms persist she is to get a urine sample from the sterile cup I supplied and bring it in to our office for analysis.

## 2018-03-10 DIAGNOSIS — C679 Malignant neoplasm of bladder, unspecified: Secondary | ICD-10-CM | POA: Insufficient documentation

## 2018-03-10 NOTE — Progress Notes (Signed)
[place]  [phone] [phone]  ID: Kyle Gregory OB: 03/03/1941  MR#: [id number]  QIO#:[id number]  Patient Care Team: Rusty Aus, MD as PCP - General (Internal Medicine)  CHIEF COMPLAINT: Noninvasive bladder cancer  INTERVAL HISTORY: Patient is a 77 year old male with noninvasive bladder cancer is referred for evaluation and intravesical gemcitabine.  He had surgery in September and 1 dose of intravesical gemcitabine at that point.  He currently feels well and is asymptomatic.  He has no neurologic complaints.  He denies any recent fevers or illnesses.  He has a good appetite and denies weight loss.  He has no chest pain or shortness of breath.  He denies any nausea, vomiting, constipation, or diarrhea.  He has no urinary complaints.  He denies hematuria.  Patient feels at his baseline offers no specific complaints today.  REVIEW OF SYSTEMS:   Review of Systems  Constitutional: Negative.  Negative for fever, malaise/fatigue and weight loss.  Respiratory: Negative.  Negative for cough and shortness of breath.   Cardiovascular: Negative.  Negative for chest pain and leg swelling.  Gastrointestinal: Negative.  Negative for abdominal pain.  Genitourinary: Negative.  Negative for dysuria and hematuria.  Musculoskeletal: Negative.  Negative for back pain.  Skin: Negative.  Negative for rash.  Neurological: Negative.  Negative for focal weakness, weakness and headaches.  Psychiatric/Behavioral: Negative.  The patient is not nervous/anxious.     As per HPI. Otherwise, a complete review of systems is negative.  PAST MEDICAL HISTORY: Past Medical History:  Diagnosis Date  . Anemia   . Aortic stenosis   . Arrhythmia   . Atrial fibrillation ([place])   . Bladder cancer ([place])   . Cardiomyopathy ([place])   . CHF (congestive heart failure) ([place])   . COPD (chronic obstructive pulmonary disease) ([place])   . Heartburn   . Stroke ([place])   . VRE  (vancomycin resistant enterococcus) culture positive     PAST SURGICAL HISTORY: Past Surgical History:  Procedure Laterality Date  . 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 CYSTOSCOPY W/ RETROGRADES Bilateral 08/14/2016   Procedure: CYSTOSCOPY WITH RETROGRADE PYELOGRAM;  Surgeon: Hollice Espy, MD;  Location: [facility];  Service: Urology;  Laterality: Bilateral;  . LEFT HEART CATH AND CORONARY ANGIOGRAPHY Left 09/05/2016   Procedure: Left Heart Cath and Coronary Angiography;  Surgeon: Teodoro Spray, MD;  Location: [facility];  Service: Cardiovascular;  Laterality: Left;  . neck growth removal     1970's  . TONSILLECTOMY    . TRANSURETHRAL RESECTION OF BLADDER TUMOR N/A 08/14/2016   Procedure: TRANSURETHRAL RESECTION OF BLADDER TUMOR (TURBT);  Surgeon: Hollice Espy, MD;  Location: [facility];  Service: Urology;  Laterality: N/A;  . TRANSURETHRAL RESECTION OF BLADDER TUMOR N/A 01/25/2018   Procedure: TRANSURETHRAL RESECTION OF BLADDER TUMOR (TURBT);  Surgeon: Billey Co, MD;  Location: [facility];  Service: Urology;  Laterality: N/A;    FAMILY HISTORY: Family History  Problem Relation Age of Onset  . Heart failure Father   . Heart failure Brother   . Atrial fibrillation Sister   . Prostate cancer Neg Hx   . Kidney cancer Neg Hx   . Bladder Cancer Neg Hx     ADVANCED DIRECTIVES (Y/N):  N  HEALTH MAINTENANCE: Social History   Tobacco Use  . Smoking status: Never Smoker  . Smokeless tobacco: Never Used  Substance Use Topics  . Alcohol use: No  .  Drug use: No     Colonoscopy:  PAP:  Bone density:  Lipid panel:  Allergies  Allergen Reactions  . Amiodarone     Other reaction(s): Dizziness  . Donepezil Palpitations    Severe bradycardia    Current Outpatient Medications  Medication Sig Dispense Refill  . Calcium Carbonate-Vitamin D (OYSTER SHELL CALCIUM 500 + D) 500-125 MG-UNIT TABS Take 1 tablet by mouth daily.       . ferrous sulfate 325 (65 FE) MG tablet Take 325 mg by mouth 2 (two) times daily with a meal.    . FLUoxetine (PROZAC) 20 MG capsule Take 20 mg by mouth daily.     . furosemide (LASIX) 40 MG tablet Take 1 tablet (40 mg total) by mouth daily. 30 tablet 0  . Melatonin 3 MG TABS Take 1 tablet by mouth at bedtime.    . nitroGLYCERIN (NITROSTAT) 0.4 MG SL tablet Place 0.4 mg under the tongue every 5 (five) minutes as needed for chest pain (Up to 3 doses).     Marland Kitchen omeprazole (PRILOSEC) 20 MG capsule Take 20 mg by mouth daily as needed (Heartburn or acid reflux).     . pravastatin (PRAVACHOL) 40 MG tablet Take 1 tablet by mouth at bedtime.   11  . spironolactone (ALDACTONE) 25 MG tablet Take 1 tablet by mouth daily.    Marland Kitchen zinc oxide ([employer]S ZINC OXIDE) 20 % ointment Apply 1 application topically 2 (two) times daily as needed for irritation.      No current facility-administered medications for this visit.     OBJECTIVE: Vitals:   03/13/18 1038  BP: 101/67  Pulse: 100  Resp: 18  Temp: (!) 96.8 F (36 C)     Body mass index is 25.79 kg/m.    ECOG FS:0 - Asymptomatic  General: Well-developed, well-nourished, no acute distress.  Sitting in a wheelchair.   Eyes: Pink conjunctiva, anicteric sclera. HEENT: Normocephalic, moist mucous membranes, clear oropharnyx. Lungs: Clear to auscultation bilaterally. Heart: Regular rate and rhythm. No rubs, murmurs, or gallops. Abdomen: Soft, nontender, nondistended. No organomegaly noted, normoactive bowel sounds. Musculoskeletal: No edema, cyanosis, or clubbing. Neuro: Alert, answering all questions appropriately. Cranial nerves grossly intact. Skin: No rashes or petechiae noted. Psych: Normal affect.  LAB RESULTS:  Lab Results  Component Value Date   NA 135 01/18/2018   K 4.4 01/18/2018   CL 99 01/18/2018   CO2 27 01/18/2018   GLUCOSE 91 01/18/2018   BUN 26 (H) 01/18/2018   CREATININE 1.14 01/18/2018   CALCIUM 9.0 01/18/2018   PROT 7.8 09/03/2017    ALBUMIN 3.5 09/03/2017   AST 52 (H) 09/03/2017   ALT 44 09/03/2017   ALKPHOS 204 (H) 09/03/2017   BILITOT 2.3 (H) 09/03/2017   GFRNONAA >60 01/18/2018   GFRAA >60 01/18/2018    Lab Results  Component Value Date   WBC 4.9 01/18/2018   NEUTROABS 3.1 09/03/2017   HGB 13.9 01/18/2018   HCT 41.5 01/18/2018   MCV 93.4 01/18/2018   PLT 136 (L) 01/18/2018     STUDIES: No results found.  ASSESSMENT: Noninvasive bladder cancer  PLAN:    1.  Noninvasive bladder cancer: Patient was diagnosed with a high-grade noninvasive bladder cancer on January 25, 2018.  He received 1 dose of intravesical gemcitabine at that time.  Patient will now proceed with 6 weekly infusions of intravascular gemcitabine.  He understands that any questions or concerns regarding his noninvasive bladder cancer or his treatments should be deferred to  his primary urologist.  No further follow-up is necessary.  Return to clinic on March 20, 2018 for cycle 1 of 6 of weekly intravesical gemcitabine.  I spent a total of 45 minutes face-to-face with the patient of which greater than 50% of the visit was spent in counseling and coordination of care as detailed above.  Patient expressed understanding and was in agreement with this plan. He also understands that He can call clinic at any time with any questions, concerns, or complaints.   Cancer Staging No matching staging information was found for the patient.  Lloyd Huger, MD   03/14/2018 8:42 AM

## 2018-03-13 ENCOUNTER — Encounter: Payer: Self-pay | Admitting: Oncology

## 2018-03-13 ENCOUNTER — Ambulatory Visit: Payer: Medicare Other | Admitting: Oncology

## 2018-03-13 ENCOUNTER — Inpatient Hospital Stay: Payer: Medicare Other | Attending: Oncology | Admitting: Oncology

## 2018-03-13 DIAGNOSIS — C679 Malignant neoplasm of bladder, unspecified: Secondary | ICD-10-CM | POA: Diagnosis present

## 2018-03-13 DIAGNOSIS — Z5111 Encounter for antineoplastic chemotherapy: Secondary | ICD-10-CM | POA: Diagnosis not present

## 2018-03-13 DIAGNOSIS — Z79899 Other long term (current) drug therapy: Secondary | ICD-10-CM | POA: Diagnosis not present

## 2018-03-13 NOTE — Progress Notes (Signed)
New patient referred by Er Sninsky for bladder cancer.

## 2018-03-20 ENCOUNTER — Inpatient Hospital Stay: Payer: Medicare Other

## 2018-03-20 VITALS — BP 103/66 | HR 99 | Resp 18

## 2018-03-20 DIAGNOSIS — Z5111 Encounter for antineoplastic chemotherapy: Secondary | ICD-10-CM | POA: Diagnosis not present

## 2018-03-20 DIAGNOSIS — C679 Malignant neoplasm of bladder, unspecified: Secondary | ICD-10-CM

## 2018-03-20 LAB — URINALYSIS, COMPLETE (UACMP) WITH MICROSCOPIC
Bilirubin Urine: NEGATIVE
GLUCOSE, UA: NEGATIVE mg/dL
Hgb urine dipstick: NEGATIVE
KETONES UR: NEGATIVE mg/dL
NITRITE: NEGATIVE
PH: 5 (ref 5.0–8.0)
Protein, ur: NEGATIVE mg/dL
SPECIFIC GRAVITY, URINE: 1.014 (ref 1.005–1.030)
WBC, UA: >50 WBC/hpf — ABNORMAL HIGH (ref 0–5)

## 2018-03-20 MED ORDER — GEMCITABINE CHEMO FOR BLADDER INSTILLATION 2000 MG
2000.0000 mg | Freq: Once | INTRAVENOUS | Status: AC
  Administered 2018-03-20: 2000 mg via INTRAVESICAL
  Filled 2018-03-20: qty 52.6

## 2018-03-20 NOTE — Progress Notes (Signed)
Spoke with Dr. Grayland Ormond regarding pt's urine results and he states to go ahead with treatment. 1130am 36f foley inserted without difficulty 1250 foley unclamped, and allowed to drain 1330 foley d/ced, pt and support person instructed to shower once hoe to ensure no irritation from the procedure, they verbalize u/o, pt d/ced home accomp to car via w/c

## 2018-03-27 ENCOUNTER — Inpatient Hospital Stay: Payer: Medicare Other

## 2018-03-27 VITALS — BP 105/68 | HR 60 | Temp 97.6°F | Resp 18 | Wt 190.5 lb

## 2018-03-27 DIAGNOSIS — C679 Malignant neoplasm of bladder, unspecified: Secondary | ICD-10-CM

## 2018-03-27 DIAGNOSIS — Z5111 Encounter for antineoplastic chemotherapy: Secondary | ICD-10-CM | POA: Diagnosis not present

## 2018-03-27 LAB — URINALYSIS, COMPLETE (UACMP) WITH MICROSCOPIC
BILIRUBIN URINE: NEGATIVE
Glucose, UA: NEGATIVE mg/dL
HGB URINE DIPSTICK: NEGATIVE
KETONES UR: NEGATIVE mg/dL
Nitrite: NEGATIVE
Protein, ur: NEGATIVE mg/dL
Specific Gravity, Urine: 1.006 (ref 1.005–1.030)
WBC, UA: >50 WBC/hpf — ABNORMAL HIGH (ref 0–5)
pH: 7 (ref 5.0–8.0)

## 2018-03-27 MED ORDER — GEMCITABINE CHEMO FOR BLADDER INSTILLATION 2000 MG
2000.0000 mg | Freq: Once | INTRAVENOUS | Status: AC
  Administered 2018-03-27: 2000 mg via INTRAVESICAL
  Filled 2018-03-27: qty 52.6

## 2018-03-27 NOTE — Progress Notes (Signed)
Notified Dr. Tasia Catchings regarding patient receiving gemcitabine via bladder today. The urinalysis  resulted in a large amount of leukocytes and rare bacteria. Dr. Tasia Catchings ordered to proceed with treatment.

## 2018-04-03 ENCOUNTER — Inpatient Hospital Stay: Payer: Medicare Other | Attending: Oncology

## 2018-04-03 VITALS — BP 107/72 | HR 96 | Temp 95.8°F | Resp 20

## 2018-04-03 DIAGNOSIS — Z5111 Encounter for antineoplastic chemotherapy: Secondary | ICD-10-CM | POA: Diagnosis not present

## 2018-04-03 DIAGNOSIS — C679 Malignant neoplasm of bladder, unspecified: Secondary | ICD-10-CM

## 2018-04-03 LAB — URINALYSIS, COMPLETE (UACMP) WITH MICROSCOPIC
Bacteria, UA: NONE SEEN
Bilirubin Urine: NEGATIVE
Glucose, UA: NEGATIVE mg/dL
Hgb urine dipstick: NEGATIVE
Ketones, ur: NEGATIVE mg/dL
Nitrite: NEGATIVE
PH: 7 (ref 5.0–8.0)
Protein, ur: NEGATIVE mg/dL
SPECIFIC GRAVITY, URINE: 1.009 (ref 1.005–1.030)

## 2018-04-03 MED ORDER — OXYBUTYNIN CHLORIDE 5 MG PO TABS
5.0000 mg | ORAL_TABLET | Freq: Once | ORAL | Status: AC | PRN
  Administered 2018-04-03: 5 mg via ORAL
  Filled 2018-04-03: qty 1

## 2018-04-03 MED ORDER — GEMCITABINE CHEMO FOR BLADDER INSTILLATION 2000 MG
2000.0000 mg | Freq: Once | INTRAVENOUS | Status: AC
  Administered 2018-04-03: 2000 mg via INTRAVESICAL
  Filled 2018-04-03: qty 52.6

## 2018-04-10 ENCOUNTER — Inpatient Hospital Stay: Payer: Medicare Other

## 2018-04-10 ENCOUNTER — Emergency Department
Admission: EM | Admit: 2018-04-10 | Discharge: 2018-04-10 | Disposition: A | Discharge status: Home / Self Care | Payer: Medicare Other | Source: Home / Self Care | Attending: Emergency Medicine | Admitting: Emergency Medicine

## 2018-04-10 VITALS — BP 105/69 | HR 100 | Temp 98.5°F | Resp 20 | Wt 192.0 lb

## 2018-04-10 DIAGNOSIS — Z79899 Other long term (current) drug therapy: Secondary | ICD-10-CM

## 2018-04-10 DIAGNOSIS — F0150 Vascular dementia without behavioral disturbance: Secondary | ICD-10-CM

## 2018-04-10 DIAGNOSIS — I5022 Chronic systolic (congestive) heart failure: Secondary | ICD-10-CM | POA: Insufficient documentation

## 2018-04-10 DIAGNOSIS — R319 Hematuria, unspecified: Secondary | ICD-10-CM | POA: Insufficient documentation

## 2018-04-10 DIAGNOSIS — Z5111 Encounter for antineoplastic chemotherapy: Secondary | ICD-10-CM | POA: Diagnosis not present

## 2018-04-10 DIAGNOSIS — J449 Chronic obstructive pulmonary disease, unspecified: Secondary | ICD-10-CM

## 2018-04-10 DIAGNOSIS — I4891 Unspecified atrial fibrillation: Secondary | ICD-10-CM | POA: Insufficient documentation

## 2018-04-10 DIAGNOSIS — Z7901 Long term (current) use of anticoagulants: Secondary | ICD-10-CM | POA: Insufficient documentation

## 2018-04-10 DIAGNOSIS — C679 Malignant neoplasm of bladder, unspecified: Secondary | ICD-10-CM

## 2018-04-10 LAB — URINALYSIS, COMPLETE (UACMP) WITH MICROSCOPIC
BACTERIA UA: NONE SEEN
Bacteria, UA: NONE SEEN
Bilirubin Urine: NEGATIVE
GLUCOSE, UA: NEGATIVE mg/dL
HGB URINE DIPSTICK: NEGATIVE
KETONES UR: NEGATIVE mg/dL
NITRITE: NEGATIVE
PH: 7 (ref 5.0–8.0)
PROTEIN: NEGATIVE mg/dL
RBC / HPF: >50 RBC/hpf — ABNORMAL HIGH (ref 0–5)
Specific Gravity, Urine: 1.009 (ref 1.005–1.030)
Specific Gravity, Urine: 1.017 (ref 1.005–1.030)
Squamous Epithelial / HPF: NONE SEEN (ref 0–5)
WBC, UA: >50 WBC/hpf — ABNORMAL HIGH (ref 0–5)

## 2018-04-10 LAB — PROTIME-INR
INR: 2.6
Prothrombin Time: 27.5 seconds — ABNORMAL HIGH (ref 11.4–15.2)

## 2018-04-10 LAB — CBC
HCT: 43.3 % (ref 39.0–52.0)
Hemoglobin: 14.3 g/dL (ref 13.0–17.0)
MCH: 31.4 pg (ref 26.0–34.0)
MCHC: 33 g/dL (ref 30.0–36.0)
MCV: 95.2 fL (ref 80.0–100.0)
Platelets: 113 10*3/uL — ABNORMAL LOW (ref 150–400)
RBC: 4.55 MIL/uL (ref 4.22–5.81)
RDW: 15.3 % (ref 11.5–15.5)
WBC: 7 10*3/uL (ref 4.0–10.5)
nRBC: 0 % (ref 0.0–0.2)

## 2018-04-10 LAB — COMPREHENSIVE METABOLIC PANEL
ALT: 34 U/L (ref 0–44)
AST: 48 U/L — ABNORMAL HIGH (ref 15–41)
Albumin: 3.8 g/dL (ref 3.5–5.0)
Alkaline Phosphatase: 105 U/L (ref 38–126)
Anion gap: 9 (ref 5–15)
BUN: 30 mg/dL — ABNORMAL HIGH (ref 8–23)
CO2: 28 mmol/L (ref 22–32)
Calcium: 9.2 mg/dL (ref 8.9–10.3)
Chloride: 97 mmol/L — ABNORMAL LOW (ref 98–111)
Creatinine, Ser: 1.16 mg/dL (ref 0.61–1.24)
GFR calc Af Amer: >60 mL/min (ref >60)
GFR calc non Af Amer: >60 mL/min (ref >60)
Glucose, Bld: 103 mg/dL — ABNORMAL HIGH (ref 70–99)
Potassium: 4.7 mmol/L (ref 3.5–5.1)
Sodium: 134 mmol/L — ABNORMAL LOW (ref 135–145)
Total Bilirubin: 1.9 mg/dL — ABNORMAL HIGH (ref 0.3–1.2)
Total Protein: 7.8 g/dL (ref 6.5–8.1)

## 2018-04-10 MED ORDER — GEMCITABINE CHEMO FOR BLADDER INSTILLATION 2000 MG
2000.0000 mg | Freq: Once | INTRAVENOUS | Status: AC
  Administered 2018-04-10: 2000 mg via INTRAVESICAL
  Filled 2018-04-10: qty 52.6

## 2018-04-10 MED ORDER — OXYBUTYNIN CHLORIDE 5 MG PO TABS
5.0000 mg | ORAL_TABLET | Freq: Once | ORAL | Status: AC | PRN
  Administered 2018-04-10: 5 mg via ORAL
  Filled 2018-04-10: qty 1

## 2018-04-10 NOTE — ED Provider Notes (Signed)
[facility] Emergency Department Provider Note  Time seen: 8:08 PM  I have reviewed the triage vital signs and the nursing notes.   HISTORY  Chief Complaint Hematuria    HPI Kyle Gregory is a 77 y.o. male with a past medical history of anemia, atrial fibrillation on Coumadin, dementia, CHF, presents to the emergency department for hematuria.  According to the wife and record review patient has a history of bladder cancer in September had a transurethral resection of said cancer, has been getting intra-bladder chemotherapy, today was his fourth dose of chemotherapy.  During administration the nurse had to place a catheter to administer the chemotherapy into the bladder.  Wife noted when they took the catheter out there was a small amount of blood at the urethral meatus.  They were told to follow-up with a doctor if he develops any significant bleeding.  Wife states around 2 or 3:00 today he attempted to urinate and peed all blood.  They called EMS but at that time the patient refused to come to the hospital for evaluation.  Patient try to urinate once again tonight and had even more bleeding.  Wife states even he was not trying to urinate blood was coming out of his penis so she called EMS and they transported the patient to the emergency department.  Here the patient is awake alert, pleasant, he has dementia and cannot contribute directly to his history.  Most of the history is via his wife.   Past Medical History:  Diagnosis Date  . Anemia   . Aortic stenosis   . Arrhythmia   . Atrial fibrillation ([place])   . Bladder cancer ([place])   . Cardiomyopathy ([place])   . CHF (congestive heart failure) ([place])   . COPD (chronic obstructive pulmonary disease) ([place])   . Heartburn   . Stroke ([place])   . VRE (vancomycin resistant enterococcus) culture positive     Patient Active Problem List   Diagnosis Date Noted  . Bladder cancer ([place]) 03/10/2018  . Bradycardia 11/10/2017  .  Vascular dementia without behavioral disturbance ([place]) 10/09/2017  . Syncope 09/03/2017  . Pressure injury of skin 09/03/2017  . Medicare annual wellness visit, initial 06/26/2017  . Chronic systolic heart failure ([place]) 05/04/2017  . Lymphedema 05/04/2017  . Morbid obesity with body mass index of 40.0-49.9 ([place]) 07/12/2016  . Aortic stenosis, mild 07/11/2016  . 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 Aortic atherosclerosis ([place]) 06/12/2016  . B12 deficiency 06/12/2016  . Iron deficiency anemia due to chronic blood loss 06/12/2016  . Atrial fibrillation, chronic 06/09/2016    Past Surgical History:  Procedure Laterality Date  . 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 CYSTOSCOPY W/ RETROGRADES Bilateral 08/14/2016   Procedure: CYSTOSCOPY WITH RETROGRADE PYELOGRAM;  Surgeon: Hollice Espy, MD;  Location: [facility];  Service: Urology;  Laterality: Bilateral;  . LEFT HEART CATH AND CORONARY ANGIOGRAPHY Left 09/05/2016   Procedure: Left Heart Cath and Coronary Angiography;  Surgeon: Teodoro Spray, MD;  Location: [facility];  Service: Cardiovascular;  Laterality: Left;  . neck growth removal     1970's  . TONSILLECTOMY    . TRANSURETHRAL RESECTION OF BLADDER TUMOR N/A 08/14/2016   Procedure: TRANSURETHRAL RESECTION OF BLADDER TUMOR (TURBT);  Surgeon: Hollice Espy, MD;  Location: [facility];  Service: Urology;  Laterality: N/A;  . TRANSURETHRAL RESECTION OF BLADDER TUMOR N/A 01/25/2018   Procedure: TRANSURETHRAL RESECTION OF BLADDER  TUMOR (TURBT);  Surgeon: Billey Co, MD;  Location: [facility];  Service: Urology;  Laterality: N/A;    Prior to Admission medications   Medication Sig Start Date End Date Taking? Authorizing Provider  Calcium Carbonate-Vitamin D (OYSTER SHELL CALCIUM 500 + D) 500-125 MG-UNIT TABS Take 1 tablet by mouth daily.     [provider]  ferrous sulfate 325 (65 FE) MG tablet Take 325 mg by mouth 2 (two)  times daily with a meal.    [provider]  FLUoxetine (PROZAC) 20 MG capsule Take 20 mg by mouth daily.     [provider]  furosemide (LASIX) 40 MG tablet Take 1 tablet (40 mg total) by mouth daily. 11/11/17   Fritzi Mandes, MD  Melatonin 3 MG TABS Take 1 tablet by mouth at bedtime.    [provider]  nitroGLYCERIN (NITROSTAT) 0.4 MG SL tablet Place 0.4 mg under the tongue every 5 (five) minutes as needed for chest pain (Up to 3 doses).  02/22/17 03/13/18  [provider]  omeprazole (PRILOSEC) 20 MG capsule Take 20 mg by mouth daily as needed (Heartburn or acid reflux).     [provider]  pravastatin (PRAVACHOL) 40 MG tablet Take 1 tablet by mouth at bedtime.  04/13/17   [provider]  spironolactone (ALDACTONE) 25 MG tablet Take 1 tablet by mouth daily. 05/21/17 05/21/18  [provider]  zinc oxide (CVS ZINC OXIDE) 20 % ointment Apply 1 application topically 2 (two) times daily as needed for irritation.     [provider]    Allergies  Allergen Reactions  . Amiodarone     Other reaction(s): Dizziness  . Donepezil Palpitations    Severe bradycardia    Family History  Problem Relation Age of Onset  . Heart failure Father   . Heart failure Brother   . Atrial fibrillation Sister   . Prostate cancer Neg Hx   . Kidney cancer Neg Hx   . Bladder Cancer Neg Hx     Social History Social History   Tobacco Use  . Smoking status: Never Smoker  . Smokeless tobacco: Never Used  Substance Use Topics  . Alcohol use: No  . Drug use: No    Review of Systems Unable to obtain an adequate/accurate review of systems secondary to dementia.  ____________________________________________   PHYSICAL EXAM:  VITAL SIGNS: ED Triage Vitals  Enc Vitals Group     BP 04/10/18 1955 128/82     Pulse Rate 04/10/18 1955 (!) 112     Resp 04/10/18 1955 18     Temp 04/10/18 1955 98.1 F (36.7 C)     Temp Source 04/10/18  1955 Oral     SpO2 04/10/18 1955 100 %     Weight --      Height 04/10/18 1956 6' (1.829 m)     Head Circumference --      Peak Flow --      Pain Score 04/10/18 1955 0     Pain Loc --      Pain Edu? --      Excl. in [place]? --    Constitutional: Alert, well-appearing, no distress, pleasant. Eyes: Normal exam ENT   Head: Normocephalic and atraumatic.   Mouth/Throat: Mucous membranes are moist. Cardiovascular: Irregular rhythm rate around 100 bpm. Respiratory: Normal respiratory effort without tachypnea nor retractions. Breath sounds are clear  Gastrointestinal: Soft and nontender. No distention.  There is no CVA tenderness. Genitourinary: Patient has  an uncircumcised penis, normal in color, no edema or swelling noted.  Patient does have blood at the urethral meatus. Musculoskeletal: Nontender with normal range of motion in all extremities. Neurologic:  Normal speech and language. No gross focal neurologic deficits  Skin:  Skin is warm, dry and intact.  Psychiatric: Mood and affect are normal.   ____________________________________________   INITIAL IMPRESSION / ASSESSMENT AND PLAN / ED COURSE  Pertinent labs & imaging results that were available during my care of the patient were reviewed by me and considered in my medical decision making (see chart for details).  Patient presents to the emergency department with hematuria after receiving chemotherapy into the bladder earlier today.  Patient is on Coumadin for atrial fibrillation.  We will check labs including INR.  We will obtain a urine sample and urine culture.  We will discuss with urology once results are known.  I discussed the patient with Dr. Erlene Quan of urology.  Given reassuring labs believe the patient safe for discharge home, likely prostate trauma due to catheter along with Coumadin usage that led to bleeding.  Bleeding appears to have improved already, still urinating red/pink urine but no significant clots at this  time.  I told the patient/wife to hold the Coumadin tonight and restart tomorrow.  They are agreeable to plan of care.  ____________________________________________   FINAL CLINICAL IMPRESSION(S) / ED DIAGNOSES  Hematuria    Harvest Dark, MD 04/10/18 2213

## 2018-04-10 NOTE — ED Notes (Signed)
Post void scan = 50cc

## 2018-04-10 NOTE — ED Triage Notes (Signed)
Pt here from home via ems , with c/o penile bleeding .

## 2018-04-10 NOTE — ED Notes (Signed)
PT ASKED TO SUBMIT UA

## 2018-04-10 NOTE — ED Notes (Signed)
BLADDER SCAN = >200 ML

## 2018-04-11 ENCOUNTER — Telehealth: Payer: Self-pay

## 2018-04-11 NOTE — Telephone Encounter (Signed)
Per chart patient was seen in ED yesterday as requested by triage.

## 2018-04-12 LAB — URINE CULTURE: Culture: <10000 — AB

## 2018-04-16 ENCOUNTER — Telehealth: Payer: Self-pay | Admitting: Urology

## 2018-04-16 NOTE — Telephone Encounter (Signed)
Patient's wife called the office today.  Patient is scheduled for another gemcitabine treatment tomorrow.  Patient had complications with the last treatment with bleeding.  She is wanting to know if it is okay for him to have another treatment tomorrow.  Spoke with Dr. Diamantina Providence.  He advised that it is okay to proceed with the next treatment.  I advised patient and she expressed understanding.

## 2018-04-17 ENCOUNTER — Inpatient Hospital Stay: Payer: Medicare Other

## 2018-04-17 VITALS — BP 134/82 | HR 88 | Resp 18

## 2018-04-17 DIAGNOSIS — C679 Malignant neoplasm of bladder, unspecified: Secondary | ICD-10-CM

## 2018-04-17 DIAGNOSIS — Z5111 Encounter for antineoplastic chemotherapy: Secondary | ICD-10-CM | POA: Diagnosis not present

## 2018-04-17 LAB — URINALYSIS, COMPLETE (UACMP) WITH MICROSCOPIC
Bacteria, UA: NONE SEEN
Bilirubin Urine: NEGATIVE
Glucose, UA: NEGATIVE mg/dL
Ketones, ur: NEGATIVE mg/dL
Nitrite: NEGATIVE
Protein, ur: NEGATIVE mg/dL
Specific Gravity, Urine: 1.008 (ref 1.005–1.030)
pH: 6 (ref 5.0–8.0)

## 2018-04-17 MED ORDER — GEMCITABINE CHEMO FOR BLADDER INSTILLATION 2000 MG
2000.0000 mg | Freq: Once | INTRAVENOUS | Status: AC
  Administered 2018-04-17: 2000 mg via INTRAVESICAL
  Filled 2018-04-17: qty 52.6

## 2018-04-17 NOTE — Progress Notes (Signed)
Patient here for intravesical chemo. 16 FR foley foley inserted without difficulty and patient tolerated treatment well.

## 2018-04-23 ENCOUNTER — Other Ambulatory Visit: Payer: Self-pay | Admitting: *Deleted

## 2018-04-23 DIAGNOSIS — C679 Malignant neoplasm of bladder, unspecified: Secondary | ICD-10-CM

## 2018-04-25 ENCOUNTER — Inpatient Hospital Stay: Payer: Medicare Other

## 2018-04-25 VITALS — BP 122/82 | HR 111 | Temp 97.4°F | Resp 20

## 2018-04-25 DIAGNOSIS — Z5111 Encounter for antineoplastic chemotherapy: Secondary | ICD-10-CM | POA: Diagnosis not present

## 2018-04-25 DIAGNOSIS — C679 Malignant neoplasm of bladder, unspecified: Secondary | ICD-10-CM

## 2018-04-25 LAB — URINALYSIS, COMPLETE (UACMP) WITH MICROSCOPIC
Bilirubin Urine: NEGATIVE
Glucose, UA: NEGATIVE mg/dL
Ketones, ur: NEGATIVE mg/dL
Nitrite: NEGATIVE
Protein, ur: NEGATIVE mg/dL
SPECIFIC GRAVITY, URINE: 1.008 (ref 1.005–1.030)
pH: 6 (ref 5.0–8.0)

## 2018-04-25 MED ORDER — BELLADONNA ALKALOIDS-OPIUM 16.2-60 MG RE SUPP: 1.0000 | Freq: Once | RECTAL | Status: DC | PRN

## 2018-04-25 MED ORDER — OXYBUTYNIN CHLORIDE 5 MG PO TABS
5.0000 mg | ORAL_TABLET | Freq: Once | ORAL | Status: DC | PRN
  Filled 2018-04-25: qty 1

## 2018-04-25 MED ORDER — GEMCITABINE CHEMO FOR BLADDER INSTILLATION 2000 MG
2000.0000 mg | Freq: Once | INTRAVENOUS | Status: AC
  Administered 2018-04-25: 2000 mg via INTRAVESICAL
  Filled 2018-04-25: qty 52.6

## 2018-04-25 NOTE — Progress Notes (Signed)
Dr. Janese Banks gave order to continue with today's treatment.  She is aware of lab results.

## 2018-05-02 ENCOUNTER — Encounter: Payer: Self-pay | Admitting: Urology

## 2018-05-02 ENCOUNTER — Ambulatory Visit (INDEPENDENT_AMBULATORY_CARE_PROVIDER_SITE_OTHER): Payer: Medicare Other | Admitting: Urology

## 2018-05-02 VITALS — BP 103/70 | HR 97 | Ht 72.0 in | Wt 189.0 lb

## 2018-05-02 DIAGNOSIS — C679 Malignant neoplasm of bladder, unspecified: Secondary | ICD-10-CM | POA: Diagnosis not present

## 2018-05-02 LAB — MICROSCOPIC EXAMINATION

## 2018-05-02 LAB — URINALYSIS, COMPLETE
Bilirubin, UA: NEGATIVE
Glucose, UA: NEGATIVE
KETONES UA: NEGATIVE
Leukocytes, UA: NEGATIVE
Nitrite, UA: NEGATIVE
Protein, UA: NEGATIVE
SPEC GRAV UA: 1.015 (ref 1.005–1.030)
Urobilinogen, Ur: 1 mg/dL (ref 0.2–1.0)
pH, UA: 5.5 (ref 5.0–7.5)

## 2018-05-02 NOTE — Progress Notes (Signed)
Cystoscopy Procedure Note:  Indication: Hx of HG bladder cancer  Prior treatments: 07/2016: TURBT 3cm HG Ta and CIS BCG induction 3/6 treatments Lost to follow up 12/2017 TURBT 2cm HG Ta (only 10% of tumor HG, primarily LG), no invasion Induction gemcitabine 03/2018 (BCG shortage)  After informed consent and discussion of the procedure and its risks, Kyle Gregory was positioned and prepped in the standard fashion. Cystoscopy was performed with a flexible cystoscope. The urethra, bladder neck and entire bladder was visualized in a standard fashion. The prostate was moderate in size. The ureteral orifices were visualized in their normal location and orientation. Subtle erythematous and papillary change at prior resection site, unclear if early recurrence with inflammation 2/2 gemcitabine induction. Cytology sent.  Findings: Small erythematous patch, unclear if early recurrence v. Inflammation/gemcitabine induction effects  Assessment and Plan: Follow up cytology, if positive proceed with TURBT. If negative, proceed with mBCG  Nickolas Madrid, MD 05/02/2018

## 2018-05-08 ENCOUNTER — Other Ambulatory Visit: Payer: Self-pay | Admitting: Urology

## 2018-05-09 ENCOUNTER — Telehealth: Payer: Self-pay

## 2018-05-09 NOTE — Progress Notes (Signed)
Please let him know his urine sample from cystoscopy did not show any bladder cancer cells. This is great news.  Sarah- please set him up for mBCG in 2 months, then follow up cysto 6 weeks after mBCG completed.  Nickolas Madrid, MD 05/09/2018

## 2018-05-09 NOTE — Telephone Encounter (Signed)
-----   Message from Billey Co, MD sent at 05/09/2018  8:36 AM EST -----   ----- Message ----- From: Delman Cheadle Sent: 05/08/2018   3:16 PM EST To: Billey Co, MD

## 2018-06-20 ENCOUNTER — Emergency Department: Payer: Medicare Other

## 2018-06-20 ENCOUNTER — Encounter: Payer: Self-pay | Admitting: *Deleted

## 2018-06-20 ENCOUNTER — Inpatient Hospital Stay
Admit: 2018-06-20 | Discharge: 2018-06-20 | Disposition: A | Discharge status: Home / Self Care | Payer: Medicare Other | Attending: Internal Medicine | Admitting: Internal Medicine

## 2018-06-20 ENCOUNTER — Other Ambulatory Visit: Payer: Self-pay

## 2018-06-20 ENCOUNTER — Inpatient Hospital Stay
Admission: EM | Admit: 2018-06-20 | Discharge: 2018-06-26 | DRG: 242 | Disposition: A | Discharge status: Home / Self Care | Payer: Medicare Other | Attending: Internal Medicine | Admitting: Internal Medicine

## 2018-06-20 DIAGNOSIS — F0390 Unspecified dementia without behavioral disturbance: Secondary | ICD-10-CM | POA: Diagnosis present

## 2018-06-20 DIAGNOSIS — I5043 Acute on chronic combined systolic (congestive) and diastolic (congestive) heart failure: Secondary | ICD-10-CM | POA: Diagnosis present

## 2018-06-20 DIAGNOSIS — Z950 Presence of cardiac pacemaker: Secondary | ICD-10-CM

## 2018-06-20 DIAGNOSIS — J449 Chronic obstructive pulmonary disease, unspecified: Secondary | ICD-10-CM | POA: Diagnosis present

## 2018-06-20 DIAGNOSIS — I482 Chronic atrial fibrillation, unspecified: Secondary | ICD-10-CM | POA: Diagnosis present

## 2018-06-20 DIAGNOSIS — E875 Hyperkalemia: Secondary | ICD-10-CM | POA: Diagnosis present

## 2018-06-20 DIAGNOSIS — I420 Dilated cardiomyopathy: Secondary | ICD-10-CM | POA: Diagnosis present

## 2018-06-20 DIAGNOSIS — Z8673 Personal history of transient ischemic attack (TIA), and cerebral infarction without residual deficits: Secondary | ICD-10-CM

## 2018-06-20 DIAGNOSIS — I447 Left bundle-branch block, unspecified: Secondary | ICD-10-CM | POA: Diagnosis present

## 2018-06-20 DIAGNOSIS — K219 Gastro-esophageal reflux disease without esophagitis: Secondary | ICD-10-CM | POA: Diagnosis present

## 2018-06-20 DIAGNOSIS — I4510 Unspecified right bundle-branch block: Secondary | ICD-10-CM | POA: Diagnosis present

## 2018-06-20 DIAGNOSIS — N390 Urinary tract infection, site not specified: Secondary | ICD-10-CM | POA: Diagnosis present

## 2018-06-20 DIAGNOSIS — D631 Anemia in chronic kidney disease: Secondary | ICD-10-CM | POA: Diagnosis present

## 2018-06-20 DIAGNOSIS — Z66 Do not resuscitate: Secondary | ICD-10-CM | POA: Diagnosis present

## 2018-06-20 DIAGNOSIS — I4892 Unspecified atrial flutter: Secondary | ICD-10-CM | POA: Diagnosis present

## 2018-06-20 DIAGNOSIS — G9341 Metabolic encephalopathy: Secondary | ICD-10-CM | POA: Diagnosis present

## 2018-06-20 DIAGNOSIS — I248 Other forms of acute ischemic heart disease: Secondary | ICD-10-CM | POA: Diagnosis present

## 2018-06-20 DIAGNOSIS — R791 Abnormal coagulation profile: Secondary | ICD-10-CM | POA: Diagnosis present

## 2018-06-20 DIAGNOSIS — Z7901 Long term (current) use of anticoagulants: Secondary | ICD-10-CM

## 2018-06-20 DIAGNOSIS — Z8551 Personal history of malignant neoplasm of bladder: Secondary | ICD-10-CM

## 2018-06-20 DIAGNOSIS — I350 Nonrheumatic aortic (valve) stenosis: Secondary | ICD-10-CM | POA: Diagnosis present

## 2018-06-20 DIAGNOSIS — Z951 Presence of aortocoronary bypass graft: Secondary | ICD-10-CM

## 2018-06-20 DIAGNOSIS — T45515A Adverse effect of anticoagulants, initial encounter: Secondary | ICD-10-CM | POA: Diagnosis present

## 2018-06-20 DIAGNOSIS — N183 Chronic kidney disease, stage 3 (moderate): Secondary | ICD-10-CM | POA: Diagnosis present

## 2018-06-20 DIAGNOSIS — N179 Acute kidney failure, unspecified: Secondary | ICD-10-CM | POA: Diagnosis present

## 2018-06-20 DIAGNOSIS — Z953 Presence of xenogenic heart valve: Secondary | ICD-10-CM

## 2018-06-20 DIAGNOSIS — Z79899 Other long term (current) drug therapy: Secondary | ICD-10-CM

## 2018-06-20 DIAGNOSIS — I2510 Atherosclerotic heart disease of native coronary artery without angina pectoris: Secondary | ICD-10-CM | POA: Diagnosis present

## 2018-06-20 DIAGNOSIS — R001 Bradycardia, unspecified: Secondary | ICD-10-CM | POA: Diagnosis present

## 2018-06-20 DIAGNOSIS — R55 Syncope and collapse: Secondary | ICD-10-CM | POA: Diagnosis present

## 2018-06-20 DIAGNOSIS — Z888 Allergy status to other drugs, medicaments and biological substances status: Secondary | ICD-10-CM

## 2018-06-20 DIAGNOSIS — I739 Peripheral vascular disease, unspecified: Secondary | ICD-10-CM | POA: Diagnosis present

## 2018-06-20 DIAGNOSIS — Z8249 Family history of ischemic heart disease and other diseases of the circulatory system: Secondary | ICD-10-CM

## 2018-06-20 LAB — CBC
HCT: 42.5 % (ref 39.0–52.0)
Hemoglobin: 13.6 g/dL (ref 13.0–17.0)
MCH: 31.7 pg (ref 26.0–34.0)
MCHC: 32 g/dL (ref 30.0–36.0)
MCV: 99.1 fL (ref 80.0–100.0)
NRBC: 0 % (ref 0.0–0.2)
Platelets: 149 10*3/uL — ABNORMAL LOW (ref 150–400)
RBC: 4.29 MIL/uL (ref 4.22–5.81)
RDW: 15.3 % (ref 11.5–15.5)
WBC: 8.6 10*3/uL (ref 4.0–10.5)

## 2018-06-20 LAB — BASIC METABOLIC PANEL
Anion gap: 11 (ref 5–15)
BUN: 35 mg/dL — ABNORMAL HIGH (ref 8–23)
CHLORIDE: 103 mmol/L (ref 98–111)
CO2: 19 mmol/L — ABNORMAL LOW (ref 22–32)
CREATININE: 1.66 mg/dL — AB (ref 0.61–1.24)
Calcium: 9 mg/dL (ref 8.9–10.3)
GFR calc Af Amer: 45 mL/min — ABNORMAL LOW (ref >60)
GFR calc non Af Amer: 39 mL/min — ABNORMAL LOW (ref >60)
Glucose, Bld: 123 mg/dL — ABNORMAL HIGH (ref 70–99)
Potassium: 5.3 mmol/L — ABNORMAL HIGH (ref 3.5–5.1)
Sodium: 133 mmol/L — ABNORMAL LOW (ref 135–145)

## 2018-06-20 LAB — URINALYSIS, COMPLETE (UACMP) WITH MICROSCOPIC
Bacteria, UA: NONE SEEN
Bilirubin Urine: NEGATIVE
Glucose, UA: NEGATIVE mg/dL
Ketones, ur: NEGATIVE mg/dL
Nitrite: NEGATIVE
Protein, ur: NEGATIVE mg/dL
Specific Gravity, Urine: 1.018 (ref 1.005–1.030)
pH: 5 (ref 5.0–8.0)

## 2018-06-20 LAB — TROPONIN I
Troponin I: 0.07 ng/mL (ref <0.03)
Troponin I: 0.08 ng/mL (ref <0.03)
Troponin I: 0.09 ng/mL (ref <0.03)

## 2018-06-20 LAB — GLUCOSE, CAPILLARY: Glucose-Capillary: 103 mg/dL — ABNORMAL HIGH (ref 70–99)

## 2018-06-20 LAB — PROTIME-INR
INR: 2.24
Prothrombin Time: 24.5 seconds — ABNORMAL HIGH (ref 11.4–15.2)

## 2018-06-20 LAB — FIBRIN DERIVATIVES D-DIMER (ARMC ONLY): Fibrin derivatives D-dimer (ARMC): 450.08 ng/mL (FEU) (ref 0.00–499.00)

## 2018-06-20 LAB — BRAIN NATRIURETIC PEPTIDE: B NATRIURETIC PEPTIDE 5: 1248 pg/mL — AB (ref 0.0–100.0)

## 2018-06-20 LAB — MRSA PCR SCREENING: MRSA by PCR: POSITIVE — AB

## 2018-06-20 LAB — TSH: TSH: 3.58 u[IU]/mL (ref 0.350–4.500)

## 2018-06-20 MED ORDER — ACETAMINOPHEN 650 MG RE SUPP: 650.0000 mg | Freq: Four times a day (QID) | RECTAL | Status: DC | PRN

## 2018-06-20 MED ORDER — SPIRONOLACTONE 25 MG PO TABS
25.0000 mg | ORAL_TABLET | Freq: Every day | ORAL | Status: DC
  Filled 2018-06-20: qty 1

## 2018-06-20 MED ORDER — ORAL CARE MOUTH RINSE
15.0000 mL | Freq: Two times a day (BID) | OROMUCOSAL | Status: DC
  Administered 2018-06-20 – 2018-06-26 (×6): 15 mL via OROMUCOSAL

## 2018-06-20 MED ORDER — ATROPINE SULFATE 1 MG/10ML IJ SOSY
1.0000 mg | PREFILLED_SYRINGE | Freq: Once | INTRAMUSCULAR | Status: AC
  Administered 2018-06-20: 1 mg via INTRAVENOUS

## 2018-06-20 MED ORDER — ATROPINE SULFATE 1 MG/10ML IJ SOSY
0.5000 mg | PREFILLED_SYRINGE | Freq: Once | INTRAMUSCULAR | Status: AC
  Administered 2018-06-20: 0.5 mg via INTRAVENOUS

## 2018-06-20 MED ORDER — HYDROCODONE-ACETAMINOPHEN 5-325 MG PO TABS: 1.0000 | ORAL_TABLET | ORAL | Status: DC | PRN

## 2018-06-20 MED ORDER — ENOXAPARIN SODIUM 40 MG/0.4ML ~~LOC~~ SOLN
40.0000 mg | SUBCUTANEOUS | Status: DC
  Filled 2018-06-20: qty 0.4

## 2018-06-20 MED ORDER — NITROGLYCERIN 0.4 MG SL SUBL: 0.4000 mg | SUBLINGUAL_TABLET | SUBLINGUAL | Status: DC | PRN

## 2018-06-20 MED ORDER — WARFARIN SODIUM 4 MG PO TABS
4.0000 mg | ORAL_TABLET | Freq: Once | ORAL | Status: DC
  Filled 2018-06-20: qty 1

## 2018-06-20 MED ORDER — PANTOPRAZOLE SODIUM 40 MG PO TBEC
40.0000 mg | DELAYED_RELEASE_TABLET | Freq: Every day | ORAL | Status: DC
  Administered 2018-06-22 – 2018-06-26 (×3): 40 mg via ORAL
  Filled 2018-06-20 (×3): qty 1

## 2018-06-20 MED ORDER — ONDANSETRON HCL 4 MG/2ML IJ SOLN
4.0000 mg | Freq: Four times a day (QID) | INTRAMUSCULAR | Status: DC | PRN
  Administered 2018-06-20: 4 mg via INTRAVENOUS
  Filled 2018-06-20: qty 2

## 2018-06-20 MED ORDER — CALCIUM CARBONATE-VITAMIN D 500-200 MG-UNIT PO TABS
1.0000 | ORAL_TABLET | Freq: Every day | ORAL | Status: DC
  Administered 2018-06-22 – 2018-06-26 (×3): 1 via ORAL
  Filled 2018-06-20 (×4): qty 1

## 2018-06-20 MED ORDER — MELATONIN 5 MG PO TABS
1.0000 | ORAL_TABLET | Freq: Every day | ORAL | Status: DC
  Administered 2018-06-21 – 2018-06-25 (×4): 5 mg via ORAL
  Filled 2018-06-20 (×8): qty 1

## 2018-06-20 MED ORDER — POLYETHYLENE GLYCOL 3350 17 G PO PACK: 17.0000 g | PACK | Freq: Every day | ORAL | Status: DC | PRN

## 2018-06-20 MED ORDER — PRAVASTATIN SODIUM 40 MG PO TABS
40.0000 mg | ORAL_TABLET | Freq: Every day | ORAL | Status: DC
  Administered 2018-06-21 – 2018-06-25 (×4): 40 mg via ORAL
  Filled 2018-06-20: qty 1
  Filled 2018-06-20 (×2): qty 2
  Filled 2018-06-20: qty 1
  Filled 2018-06-20: qty 2
  Filled 2018-06-20: qty 1

## 2018-06-20 MED ORDER — ATROPINE SULFATE 1 MG/10ML IJ SOSY
PREFILLED_SYRINGE | INTRAMUSCULAR | Status: AC
  Filled 2018-06-20: qty 10

## 2018-06-20 MED ORDER — SODIUM CHLORIDE 0.9 % IV SOLN: INTRAVENOUS | Status: DC

## 2018-06-20 MED ORDER — ACETAMINOPHEN 325 MG PO TABS: 650.0000 mg | ORAL_TABLET | Freq: Four times a day (QID) | ORAL | Status: DC | PRN

## 2018-06-20 MED ORDER — SODIUM ZIRCONIUM CYCLOSILICATE 5 G PO PACK
5.0000 g | PACK | Freq: Once | ORAL | Status: DC
  Filled 2018-06-20: qty 1

## 2018-06-20 MED ORDER — SODIUM CHLORIDE 0.9% FLUSH
3.0000 mL | Freq: Once | INTRAVENOUS | Status: AC
  Administered 2018-06-20: 3 mL via INTRAVENOUS

## 2018-06-20 MED ORDER — FLUOXETINE HCL 20 MG PO CAPS
20.0000 mg | ORAL_CAPSULE | Freq: Every day | ORAL | Status: DC
  Administered 2018-06-22 – 2018-06-26 (×3): 20 mg via ORAL
  Filled 2018-06-20 (×6): qty 1

## 2018-06-20 MED ORDER — WARFARIN - PHARMACIST DOSING INPATIENT
Freq: Every day | Status: DC
  Administered 2018-06-20: 18:00:00
  Filled 2018-06-20: qty 1

## 2018-06-20 MED ORDER — FERROUS SULFATE 325 (65 FE) MG PO TABS
325.0000 mg | ORAL_TABLET | Freq: Two times a day (BID) | ORAL | Status: DC
  Administered 2018-06-21 – 2018-06-26 (×5): 325 mg via ORAL
  Filled 2018-06-20 (×7): qty 1

## 2018-06-20 MED ORDER — ONDANSETRON HCL 4 MG PO TABS: 4.0000 mg | ORAL_TABLET | Freq: Four times a day (QID) | ORAL | Status: DC | PRN

## 2018-06-20 MED ORDER — BISACODYL 5 MG PO TBEC
5.0000 mg | DELAYED_RELEASE_TABLET | Freq: Every day | ORAL | Status: DC | PRN
  Administered 2018-06-25 – 2018-06-26 (×2): 5 mg via ORAL
  Filled 2018-06-20 (×2): qty 1

## 2018-06-20 MED ORDER — FUROSEMIDE 10 MG/ML IJ SOLN
20.0000 mg | Freq: Once | INTRAMUSCULAR | Status: AC
  Administered 2018-06-20: 20 mg via INTRAVENOUS
  Filled 2018-06-20: qty 2

## 2018-06-20 NOTE — Consult Note (Signed)
ANTICOAGULATION CONSULT NOTE - Initial Consult  Pharmacy Consult for warfarin dose management Indication: atrial fibrillation  Patient Measurements: Weight: 188 lb 15 oz (85.7 kg)  Vital Signs: Temp: 97.6 F (36.4 C) (02/20 1143) Temp Source: Oral (02/20 1143) BP: 102/42 (02/20 1355) Pulse Rate: 61 (02/20 1400)  Labs: Recent Labs    06/20/18 1154 06/20/18 1233  HGB 13.6  --   HCT 42.5  --   PLT 149*  --   LABPROT  --  24.5*  INR  --  2.24  CREATININE 1.66*  --   TROPONINI 0.07*  --     Estimated Creatinine Clearance: 40.9 mL/min (A) (by C-G formula based on SCr of 1.66 mg/dL (H)).   Medical History: Past Medical History:  Diagnosis Date  . Anemia   . Aortic stenosis   . Arrhythmia   . Atrial fibrillation ([place])   . Bladder cancer ([place])   . Cardiomyopathy ([place])   . CHF (congestive heart failure) ([place])   . COPD (chronic obstructive pulmonary disease) ([place])   . Heartburn   . Stroke ([place])   . VRE (vancomycin resistant enterococcus) culture positive     Medications:  Scheduled:  . calcium-vitamin D  1 tablet Oral Daily  . enoxaparin (LOVENOX) injection  40 mg Subcutaneous Q24H  . ferrous sulfate  325 mg Oral BID WC  . FLUoxetine  20 mg Oral Daily  . furosemide  20 mg Intravenous Once  . Melatonin  1 tablet Oral QHS  . pantoprazole  40 mg Oral Daily  . pravastatin  40 mg Oral QHS  . sodium zirconium cyclosilicate  5 g Oral Once  . spironolactone  25 mg Oral Daily    Assessment: 78 y.o. male with a h/o atrial fibrillation/aortic valve replacement with bioprosthetic valve on anticoagulation, COPD, CAD status post CABG and dilated cardiomyopathy who presented from home due to shortness of breath. The patient takes 4 mg warfarin daily and INR is therapeutic on admission. There are no new DDIs to report.  Goal of Therapy:  INR 2-3 Monitor platelets by anticoagulation protocol: Yes   Plan: Continue home dose of warfarin 4 mg today. Monitor INR daily to assist  dosing.  Dallie Piles, PharmD 06/20/2018,3:04 PM

## 2018-06-20 NOTE — H&P (Signed)
PATIENT NAME: Kyle Gregory    MR#:  [id number]  DATE OF BIRTH:  1941-02-28  DATE OF ADMISSION:  06/20/2018  PRIMARY CARE PHYSICIAN: Rusty Aus, MD   REQUESTING/REFERRING PHYSICIAN: Dr. Rip Harbour  CHIEF COMPLAINT:   +SOB and weakness HISTORY OF PRESENT ILLNESS:  Kyle Gregory  is a 78 y.o. 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 Anemia   . Aortic stenosis   . Arrhythmia   . Atrial fibrillation ([place])   . Bladder cancer ([place])   . Cardiomyopathy ([place])   . CHF (congestive heart failure) ([place])   . COPD (chronic obstructive pulmonary disease) ([place])   . Heartburn   . Stroke ([place])   . VRE (vancomycin resistant enterococcus) culture positive     PAST SURGICAL HISTORY:   Past Surgical History:  Procedure Laterality Date  . 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 CYSTOSCOPY W/ RETROGRADES Bilateral 08/14/2016   Procedure: CYSTOSCOPY WITH RETROGRADE PYELOGRAM;  Surgeon: Hollice Espy, MD;  Location: [facility];  Service: Urology;  Laterality: Bilateral;  . LEFT HEART CATH AND CORONARY ANGIOGRAPHY Left 09/05/2016   Procedure: Left Heart Cath and Coronary Angiography;  Surgeon: Teodoro Spray, MD;  Location: [place] [facility];  Service: Cardiovascular;  Laterality: Left;  . neck growth removal     1970's  . TONSILLECTOMY    . TRANSURETHRAL RESECTION OF BLADDER TUMOR N/A 08/14/2016   Procedure: TRANSURETHRAL RESECTION OF BLADDER TUMOR (TURBT);  Surgeon: Hollice Espy, MD;  Location: [facility];  Service: Urology;  Laterality:  N/A;  . TRANSURETHRAL RESECTION OF BLADDER TUMOR N/A 01/25/2018   Procedure: TRANSURETHRAL RESECTION OF BLADDER TUMOR (TURBT);  Surgeon: Billey Co, MD;  Location: [facility];  Service: Urology;  Laterality: N/A;    SOCIAL HISTORY:   Social History   Tobacco Use  . Smoking status: Never Smoker  . Smokeless tobacco: Never Used  Substance Use Topics  . Alcohol use: No    FAMILY HISTORY:   Family History  Problem Relation Age of Onset  . Heart failure Father   . Heart failure Brother   . Atrial fibrillation Sister   . Prostate cancer Neg Hx   . Kidney cancer Neg Hx   . Bladder Cancer Neg Hx     DRUG ALLERGIES:   Allergies  Allergen Reactions  . Amiodarone     Other reaction(s): Dizziness  . Donepezil Palpitations    Severe bradycardia    REVIEW OF SYSTEMS:   Review of Systems  Constitutional: Positive for malaise/fatigue. Negative for chills and fever.  HENT: Negative.  Negative for ear discharge, ear pain, hearing loss, nosebleeds and sore throat.   Eyes: Negative.  Negative for blurred vision and pain.  Respiratory: Positive for shortness of breath. Negative for cough, hemoptysis and wheezing.   Cardiovascular: Negative.  Negative for chest pain, palpitations and leg swelling.  Gastrointestinal: Negative.  Negative for abdominal pain, blood in stool, diarrhea, nausea and vomiting.  Genitourinary: Negative.  Negative for dysuria.  Musculoskeletal:  Negative.  Negative for back pain.  Skin: Negative.   Neurological: Negative for dizziness, tremors, speech change, focal weakness, seizures and headaches.  Endo/Heme/Allergies: Negative.  Does not bruise/bleed easily.  Psychiatric/Behavioral: Negative.  Negative for depression, hallucinations and suicidal ideas.    MEDICATIONS AT HOME:   Prior to Admission medications   Medication Sig Start Date End Date Taking? Authorizing Provider  Calcium Carbonate-Vitamin D (OYSTER SHELL CALCIUM 500 + D) 500-125 MG-UNIT TABS  Take 1 tablet by mouth daily.     [provider]  ferrous sulfate 325 (65 FE) MG tablet Take 325 mg by mouth 2 (two) times daily with a meal.    [provider]  FLUoxetine (PROZAC) 20 MG capsule Take 20 mg by mouth daily.     [provider]  furosemide (LASIX) 40 MG tablet Take 1 tablet (40 mg total) by mouth daily. 11/11/17   Fritzi Mandes, MD  Melatonin 3 MG TABS Take 1 tablet by mouth at bedtime.    [provider]  metoprolol tartrate (LOPRESSOR) 25 MG tablet Take 12.5 mg by mouth 3 (three) times daily. 01/21/18   [provider]  nitroGLYCERIN (NITROSTAT) 0.4 MG SL tablet Place 0.4 mg under the tongue every 5 (five) minutes as needed for chest pain (Up to 3 doses).  02/22/17 03/13/18  [provider]  omeprazole (PRILOSEC) 20 MG capsule Take 20 mg by mouth daily as needed (Heartburn or acid reflux).     [provider]  pravastatin (PRAVACHOL) 40 MG tablet Take 1 tablet by mouth at bedtime.  04/13/17   [provider]  spironolactone (ALDACTONE) 25 MG tablet Take 1 tablet by mouth daily. 05/21/17 05/21/18  [provider]  warfarin (COUMADIN) 2 MG tablet 4 mg as directed. 03/19/18   [provider]  zinc oxide ([employer] ZINC OXIDE) 20 % ointment Apply 1 application topically 2 (two) times daily as needed for irritation.     [provider]      VITAL SIGNS:  Blood pressure (!) 119/52, pulse (!) 31, temperature 97.6 F (36.4 C), temperature source Oral, resp. rate 15, weight 85.7 kg, SpO2 100 %.  PHYSICAL EXAMINATION:   Physical Exam Constitutional:      General: He is not in acute distress. HENT:     Head: Normocephalic.  Eyes:     General: No scleral icterus. Neck:     Musculoskeletal: Normal range of motion and neck supple.     Vascular: No JVD.     Trachea: No tracheal deviation.  Cardiovascular:     Rate and Rhythm: Bradycardia present. Rhythm irregular.     Heart sounds: Normal heart  sounds. No murmur. No friction rub. No gallop.      Comments: Bradycardic heart rate 20s Pulmonary:     Effort: Pulmonary effort is normal. No respiratory distress.     Breath sounds: Examination of the right-middle field reveals rhonchi. Examination of the left-middle field reveals rhonchi. Rhonchi present. No wheezing or rales.  Chest:     Chest wall: No tenderness.  Abdominal:     General: Bowel sounds are normal. There is no distension.     Palpations: Abdomen is soft. There is no mass.     Tenderness: There is no abdominal tenderness. There is no guarding or rebound.  Musculoskeletal: Normal range of motion.  Skin:    General: Skin is warm.     Findings: No erythema or rash.  Neurological:     Mental Status: He  is alert and oriented to person, place, and time.  Psychiatric:        Judgment: Judgment normal.       LABORATORY PANEL:   CBC Recent Labs  Lab 06/20/18 1154  WBC 8.6  HGB 13.6  HCT 42.5  PLT 149*   ------------------------------------------------------------------------------------------------------------------  Chemistries  Recent Labs  Lab 06/20/18 1154  NA 133*  K 5.3*  CL 103  CO2 19*  GLUCOSE 123*  BUN 35*  CREATININE 1.66*  CALCIUM 9.0   ------------------------------------------------------------------------------------------------------------------  Cardiac Enzymes Recent Labs  Lab 06/20/18 1154  TROPONINI 0.07*   ------------------------------------------------------------------------------------------------------------------  RADIOLOGY:  Dg Chest 2 View  Result Date: 06/20/2018 CLINICAL DATA:  Dyspnea EXAM: CHEST - 2 VIEW COMPARISON:  01/10/2018 chest radiograph. FINDINGS: Cardiac valve prosthesis is in place. CABG clips overlie the mediastinum. Stable cardiomediastinal silhouette with moderate cardiomegaly. No pneumothorax. No right pleural effusion. Stable mild elevation of the left hemidiaphragm. Chronic mild blunting of left  costophrenic angle, unchanged. No overt pulmonary edema. Mild left lung base atelectasis. No acute consolidative airspace disease. IMPRESSION: 1. Stable cardiomegaly without overt pulmonary edema. 2. Stable chronic blunting of the left costophrenic angle, probably due to pleural-parenchymal scarring, can not exclude a small left pleural effusion. Electronically Signed   By: Ilona Sorrel M.D.   On: 06/20/2018 12:18     IMPRESSION AND PLAN:   Acute symptomatic bradycardia H/o sCHF and dCHF EF 25% ICU monitoring Hold beta blocker Check TSH transcutaneous pacer pads placed Follow up CE and ECHO Hold AC for heart valve  Patient is DNR/DNI    Critical Care Time devoted to patient care services described in this note is 34 minutes.   Overall, patient is critically ill, prognosis is guarded.   high risk for cardiac arrest and death.    Corrin Parker, M.D.  [facility]  Medical Director [place] Director [facility] Cardio-Pulmonary Department

## 2018-06-20 NOTE — H&P (Signed)
[facility] [place] NAME: Rawley Harju    MR#:  [id number]  DATE OF BIRTH:  1940/07/02  DATE OF ADMISSION:  06/20/2018  PRIMARY CARE PHYSICIAN: Rusty Aus, MD   REQUESTING/REFERRING PHYSICIAN: Dr. Rip Harbour  CHIEF COMPLAINT:   Shortness of breath and weakness HISTORY OF PRESENT ILLNESS:  Lliam Hoh  is a 78 y.o. male with a known history of chronic atrial fibrillation/aortic valve replacement with bioprosthetic valve on anticoagulation, COPD, CAD status post CABG and dilated cardiomyopathy Who presents from home due to shortness of breath.  Apparently patient walked with physical therapy this morning and did okay however after an hour or so wife noted that he was pale and had blue fingertips.  He was also complaining of shortness of breath so he was sent via EMS to the emergency room for further evaluation.  In the emergency room chest x-ray does not show evidence of pneumonia or CHF however he is noted to have bradycardia with heart rates in the 20s to 30s.  He was given atropine. Patient appears to have flutter waves with slow ventricular response. PAST MEDICAL HISTORY:   Past Medical History:  Diagnosis Date  . Anemia   . Aortic stenosis   . Arrhythmia   . Atrial fibrillation ([place])   . Bladder cancer ([place])   . Cardiomyopathy ([place])   . CHF (congestive heart failure) ([place])   . COPD (chronic obstructive pulmonary disease) ([place])   . Heartburn   . Stroke ([place])   . VRE (vancomycin resistant enterococcus) culture positive     PAST SURGICAL HISTORY:   Past Surgical History:  Procedure Laterality Date  . 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 CYSTOSCOPY W/ RETROGRADES Bilateral 08/14/2016   Procedure: CYSTOSCOPY WITH RETROGRADE PYELOGRAM;  Surgeon: Hollice Espy, MD;  Location: [facility];  Service: Urology;  Laterality: Bilateral;  . LEFT HEART CATH AND CORONARY ANGIOGRAPHY Left 09/05/2016    Procedure: Left Heart Cath and Coronary Angiography;  Surgeon: Teodoro Spray, MD;  Location: [place] [facility];  Service: Cardiovascular;  Laterality: Left;  . neck growth removal     1970's  . TONSILLECTOMY    . TRANSURETHRAL RESECTION OF BLADDER TUMOR N/A 08/14/2016   Procedure: TRANSURETHRAL RESECTION OF BLADDER TUMOR (TURBT);  Surgeon: Hollice Espy, MD;  Location: [facility];  Service: Urology;  Laterality: N/A;  . TRANSURETHRAL RESECTION OF BLADDER TUMOR N/A 01/25/2018   Procedure: TRANSURETHRAL RESECTION OF BLADDER TUMOR (TURBT);  Surgeon: Billey Co, MD;  Location: [facility];  Service: Urology;  Laterality: N/A;    SOCIAL HISTORY:   Social History   Tobacco Use  . Smoking status: Never Smoker  . Smokeless tobacco: Never Used  Substance Use Topics  . Alcohol use: No    FAMILY HISTORY:   Family History  Problem Relation Age of Onset  . Heart failure Father   . Heart failure Brother   . Atrial fibrillation Sister   . Prostate cancer Neg Hx   . Kidney cancer Neg Hx   . Bladder Cancer Neg Hx     DRUG ALLERGIES:   Allergies  Allergen Reactions  . Amiodarone     Other reaction(s): Dizziness  . Donepezil Palpitations    Severe bradycardia    REVIEW OF SYSTEMS:   Review of Systems  Constitutional: Positive for malaise/fatigue. Negative for chills and fever.  HENT: Negative.  Negative for ear discharge, ear  pain, hearing loss, nosebleeds and sore throat.   Eyes: Negative.  Negative for blurred vision and pain.  Respiratory: Positive for shortness of breath. Negative for cough, hemoptysis and wheezing.   Cardiovascular: Negative.  Negative for chest pain, palpitations and leg swelling.  Gastrointestinal: Negative.  Negative for abdominal pain, blood in stool, diarrhea, nausea and vomiting.  Genitourinary: Negative.  Negative for dysuria.  Musculoskeletal: Negative.  Negative for back pain.  Skin: Negative.   Neurological: Negative for dizziness, tremors,  speech change, focal weakness, seizures and headaches.  Endo/Heme/Allergies: Negative.  Does not bruise/bleed easily.  Psychiatric/Behavioral: Negative.  Negative for depression, hallucinations and suicidal ideas.    MEDICATIONS AT HOME:   Prior to Admission medications   Medication Sig Start Date End Date Taking? Authorizing Provider  Calcium Carbonate-Vitamin D (OYSTER SHELL CALCIUM 500 + D) 500-125 MG-UNIT TABS Take 1 tablet by mouth daily.     [provider]  ferrous sulfate 325 (65 FE) MG tablet Take 325 mg by mouth 2 (two) times daily with a meal.    [provider]  FLUoxetine (PROZAC) 20 MG capsule Take 20 mg by mouth daily.     [provider]  furosemide (LASIX) 40 MG tablet Take 1 tablet (40 mg total) by mouth daily. 11/11/17   Fritzi Mandes, MD  Melatonin 3 MG TABS Take 1 tablet by mouth at bedtime.    [provider]  metoprolol tartrate (LOPRESSOR) 25 MG tablet Take 12.5 mg by mouth 3 (three) times daily. 01/21/18   [provider]  nitroGLYCERIN (NITROSTAT) 0.4 MG SL tablet Place 0.4 mg under the tongue every 5 (five) minutes as needed for chest pain (Up to 3 doses).  02/22/17 03/13/18  [provider]  omeprazole (PRILOSEC) 20 MG capsule Take 20 mg by mouth daily as needed (Heartburn or acid reflux).     [provider]  pravastatin (PRAVACHOL) 40 MG tablet Take 1 tablet by mouth at bedtime.  04/13/17   [provider]  spironolactone (ALDACTONE) 25 MG tablet Take 1 tablet by mouth daily. 05/21/17 05/21/18  [provider]  warfarin (COUMADIN) 2 MG tablet 4 mg as directed. 03/19/18   [provider]  zinc oxide ([employer]S ZINC OXIDE) 20 % ointment Apply 1 application topically 2 (two) times daily as needed for irritation.     [provider]      VITAL SIGNS:  Blood pressure (!) 102/42, pulse 61, temperature 97.6 F (36.4 C), temperature source Oral, resp. rate 18, weight 85.7 kg, SpO2  100 %.  PHYSICAL EXAMINATION:   Physical Exam Constitutional:      General: He is not in acute distress. HENT:     Head: Normocephalic.  Eyes:     General: No scleral icterus. Neck:     Musculoskeletal: Normal range of motion and neck supple.     Vascular: No JVD.     Trachea: No tracheal deviation.  Cardiovascular:     Rate and Rhythm: Bradycardia present. Rhythm irregular.     Heart sounds: Normal heart sounds. No murmur. No friction rub. No gallop.      Comments: Bradycardic heart rate 20s Pulmonary:     Effort: Pulmonary effort is normal. No respiratory distress.     Breath sounds: Examination of the right-middle field reveals rhonchi. Examination of the left-middle field reveals rhonchi. Rhonchi present. No wheezing or rales.  Chest:     Chest wall: No tenderness.  Abdominal:     General: Bowel sounds  are normal. There is no distension.     Palpations: Abdomen is soft. There is no mass.     Tenderness: There is no abdominal tenderness. There is no guarding or rebound.  Musculoskeletal: Normal range of motion.  Skin:    General: Skin is warm.     Findings: No erythema or rash.  Neurological:     Mental Status: He is alert and oriented to person, place, and time.  Psychiatric:        Judgment: Judgment normal.       LABORATORY PANEL:   CBC Recent Labs  Lab 06/20/18 1154  WBC 8.6  HGB 13.6  HCT 42.5  PLT 149*   ------------------------------------------------------------------------------------------------------------------  Chemistries  Recent Labs  Lab 06/20/18 1154  NA 133*  K 5.3*  CL 103  CO2 19*  GLUCOSE 123*  BUN 35*  CREATININE 1.66*  CALCIUM 9.0   ------------------------------------------------------------------------------------------------------------------  Cardiac Enzymes Recent Labs  Lab 06/20/18 1154  TROPONINI 0.07*    ------------------------------------------------------------------------------------------------------------------  RADIOLOGY:  Dg Chest 2 View  Result Date: 06/20/2018 CLINICAL DATA:  Dyspnea EXAM: CHEST - 2 VIEW COMPARISON:  01/10/2018 chest radiograph. FINDINGS: Cardiac valve prosthesis is in place. CABG clips overlie the mediastinum. Stable cardiomediastinal silhouette with moderate cardiomegaly. No pneumothorax. No right pleural effusion. Stable mild elevation of the left hemidiaphragm. Chronic mild blunting of left costophrenic angle, unchanged. No overt pulmonary edema. Mild left lung base atelectasis. No acute consolidative airspace disease. IMPRESSION: 1. Stable cardiomegaly without overt pulmonary edema. 2. Stable chronic blunting of the left costophrenic angle, probably due to pleural-parenchymal scarring, can not exclude a small left pleural effusion. Electronically Signed   By: Ilona Sorrel M.D.   On: 06/20/2018 12:18    EKG:  Heart rate 37 with a flutter right bundle branch block  IMPRESSION AND PLAN:   78 year old male with history of chronic atrial fibrillation and aortic valve replacement on Coumadin who presents from home with shortness of breath and found to have bradycardia.  1.  Symptomatic bradycardia: Admit to ICU Intensivist consult and [facility] cardiology consultation placed via epic Continue telemetry Hold beta-blocker and check TSH Continue Zoll pads PRN atropine Check echo  2.  Elevated troponin: Likely due to demand ischemia from symptomatic bradycardia Follow telemetry and troponins Follow-up on echo  3.  Mild hyperkalemia: One-time dose of lokelma  4.  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  History of chronic atrial fibrillation: Patient is now in a flutter  with slow heart rate Holding metoprolol Coumadin dosing as per pharmacy  6.  Heart valve replacement: Continue Coumadin as per pharmacy  7.  CAD: Continue pravastatin   All the records are reviewed and case discussed with ED provider. Management plans discussed with the patient and he is in agreement  CODE STATUS:  dnr  Critical care TOTAL TIME TAKING CARE OF THIS PATIENT: 55 minutes.    Sokhna Christoph M.D on 06/20/2018 at 2:16 PM  Between 7am to 6pm - Pager - [phone]  After 6pm go to [email or web address] - password [email or web address] [facility]  Office  [phone]  CC: Primary care physician; Rusty Aus, MD

## 2018-06-20 NOTE — Progress Notes (Signed)
Dr. Clayborn Bigness paged d/t patient's HR in 20's, bp 80's/40's, sob, nausea, and agitation. Patient already being paced per Dr. Mortimer Fries. Orders per Dr. Clayborn Bigness included continue pacing as needed, d/c ivf, give 1 dose of 20 mg IV lasix, d/c all anticoagulation incase patient needs permanent pacemaker. Orders placed. Wilnette Kales

## 2018-06-20 NOTE — ED Notes (Signed)
Unable to obtain monitor blood pressure. Obtained manually and is wnl. Pt still has no complaints. Seems sob but denies. Placed on 2 liters [place]. Given atropine for hr 28 as ordered with no change. Dr Cinda Quest is aware.

## 2018-06-20 NOTE — ED Provider Notes (Signed)
[facility] Emergency Department Provider Note   ____________________________________________   First MD Initiated Contact with Patient 06/20/18 1149     (approximate)  I have reviewed the triage vital signs and the nursing notes.   HISTORY  Chief Complaint Atrial Flutter and Shortness of Breath    HPI JHAYDEN DEMURO is a 78 y.o. male who had an episode today of near syncope and turning blue with difficulty breathing.  It is since resolved.  Patient came to the emergency room for check.  Is bradycardic with a flutter slow ventricular response he now has a right bundle branch block and right axis where previously he had a left bundle branch block and left axis.  Does appear to have an [id number]   Past Medical History:  Diagnosis Date  . Anemia   . Aortic stenosis   . Arrhythmia   . Atrial fibrillation ([place])   . Bladder cancer ([place])   . Cardiomyopathy ([place])   . CHF (congestive heart failure) ([employer])   . COPD (chronic obstructive pulmonary disease) ([place])   . Heartburn   . Stroke ([place])   . VRE (vancomycin resistant enterococcus) culture positive     Patient Active Problem List   Diagnosis Date Noted  . Bladder cancer ([place]) 03/10/2018  . Bradycardia 11/10/2017  . Vascular dementia without behavioral disturbance ([place]) 10/09/2017  . Syncope 09/03/2017  . Pressure injury of skin 09/03/2017  . Medicare annual wellness visit, initial 06/26/2017  . Chronic systolic heart failure ([place]) 05/04/2017  . Lymphedema 05/04/2017  . Morbid obesity with body mass index of 40.0-49.9 ([place]) 07/12/2016  . Aortic stenosis, mild 07/11/2016  . 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 Aortic atherosclerosis ([place]) 06/12/2016  . B12 deficiency 06/12/2016  . Iron deficiency anemia due to chronic blood loss 06/12/2016  . Atrial fibrillation, chronic 06/09/2016    Past Surgical History:  Procedure Laterality Date  . CORONARY ARTERY BYPASS GRAFT     triple bypass with  aortic valve replacement  . CYSTOSCOPY W/ RETROGRADES Bilateral 08/14/2016   Procedure: CYSTOSCOPY WITH RETROGRADE PYELOGRAM;  Surgeon: Hollice Espy, MD;  Location: [facility] ORS;  Service: Urology;  Laterality: Bilateral;  . LEFT HEART CATH AND CORONARY ANGIOGRAPHY Left 09/05/2016   Procedure: Left Heart Cath and Coronary Angiography;  Surgeon: Teodoro Spray, MD;  Location: [place] CV LAB;  Service: Cardiovascular;  Laterality: Left;  . neck growth removal     1970's  . TONSILLECTOMY    . TRANSURETHRAL RESECTION OF BLADDER TUMOR N/A 08/14/2016   Procedure: TRANSURETHRAL RESECTION OF BLADDER TUMOR (TURBT);  Surgeon: Hollice Espy, MD;  Location: [facility] ORS;  Service: Urology;  Laterality: N/A;  . TRANSURETHRAL RESECTION OF BLADDER TUMOR N/A 01/25/2018   Procedure: TRANSURETHRAL RESECTION OF BLADDER TUMOR (TURBT);  Surgeon: Billey Co, MD;  Location: [facility] ORS;  Service: Urology;  Laterality: N/A;    Prior to Admission medications   Medication Sig Start Date End Date Taking? Authorizing Provider  Calcium Carbonate-Vitamin D (OYSTER SHELL CALCIUM 500 + D) 500-125 MG-UNIT TABS Take 1 tablet by mouth daily.    Yes [provider]  ferrous sulfate 325 (65 FE) MG tablet Take 325 mg by mouth 2 (two) times daily with a meal.   Yes [provider]  FLUoxetine (PROZAC) 20 MG capsule Take 20 mg by mouth daily.    Yes [provider]  furosemide (LASIX) 40 MG tablet Take 1 tablet (40 mg total) by mouth daily. 11/11/17  Yes Posey Pronto,  Sona, MD  Melatonin 3 MG TABS Take 1 tablet by mouth at bedtime.   Yes [provider]  nitroGLYCERIN (NITROSTAT) 0.4 MG SL tablet Place 0.4 mg under the tongue every 5 (five) minutes as needed for chest pain (Up to 3 doses).  02/22/17 06/20/18 Yes [provider]  omeprazole (PRILOSEC) 20 MG capsule Take 20 mg by mouth daily as needed (Heartburn or acid reflux).    Yes [provider]  pravastatin (PRAVACHOL) 40 MG tablet  Take 1 tablet by mouth at bedtime.  04/13/17  Yes [provider]  spironolactone (ALDACTONE) 25 MG tablet Take 1 tablet by mouth daily. 05/21/17 06/20/18 Yes [provider]  warfarin (COUMADIN) 2 MG tablet Take 4 mg by mouth as directed. Take 4 mg (2 tablets) daily 03/19/18  Yes [provider]  zinc oxide ([employer] ZINC OXIDE) 20 % ointment Apply 1 application topically 2 (two) times daily as needed for irritation.     [provider]    Allergies Amiodarone and Donepezil  Family History  Problem Relation Age of Onset  . Heart failure Father   . Heart failure Brother   . Atrial fibrillation Sister   . Prostate cancer Neg Hx   . Kidney cancer Neg Hx   . Bladder Cancer Neg Hx     Social History Social History   Tobacco Use  . Smoking status: Never Smoker  . Smokeless tobacco: Never Used  Substance Use Topics  . Alcohol use: No  . Drug use: No    Review of Systems  Constitutional: No fever/chills Eyes: No visual changes. ENT: No sore throat. Cardiovascular: Denies chest pain. Respiratory: Denies shortness of breath but has dementia.  Family reports he was short of breath.. Gastrointestinal: No abdominal pain.  No nausea, no vomiting.  No diarrhea.  No constipation. Genitourinary: Negative for dysuria. Musculoskeletal: Negative for back pain. Skin: Negative for rash. Neurological: Negative for headaches, focal weakness or numbness.  ____________________________________________   PHYSICAL EXAM:  VITAL SIGNS: ED Triage Vitals  Enc Vitals Group     BP 06/20/18 1143 129/60     Pulse Rate 06/20/18 1143 (!) 36     Resp 06/20/18 1143 20     Temp 06/20/18 1143 97.6 F (36.4 C)     Temp Source 06/20/18 1143 Oral     SpO2 06/20/18 1143 99 %     Weight 06/20/18 1144 188 lb 15 oz (85.7 kg)     Height --      Head Circumference --      Peak Flow --      Pain Score --      Pain Loc --      Pain Edu? --      Excl. in [place]? --      Constitutional: Alert and oriented to person and hospital. Well appearing and in no acute distress. Eyes: Conjunctivae are normal. PERRL. EOMI. Head: Atraumatic. Nose: No congestion/rhinnorhea. Mouth/Throat: Mucous membranes are moist.  Oropharynx non-erythematous. Neck: No stridor.  Cardiovascular: Slow rate, irregular rhythm. Grossly normal heart sounds.  Good peripheral circulation. Respiratory: Normal respiratory effort.  No retractions. Lungs CTAB. Gastrointestinal: Soft and nontender. No distention. No abdominal bruits. No CVA tenderness. Musculoskeletal: No lower extremity tenderness nor edema.  . Neurologic:  Normal speech and language. No gross focal neurologic deficits are appreciated.  Skin:  Skin is warm, dry and intact. No rash noted. Psychiatric: Mood and affect are normal. Speech and behavior are normal.  ____________________________________________  LABS (all labs ordered are listed, but only abnormal results are displayed)  Labs Reviewed  MRSA PCR SCREENING - Abnormal; Notable for the following components:      Result Value   MRSA by PCR POSITIVE (*)    All other components within normal limits  BASIC METABOLIC PANEL - Abnormal; Notable for the following components:   Sodium 133 (*)    Potassium 5.3 (*)    CO2 19 (*)    Glucose, Bld 123 (*)    BUN 35 (*)    Creatinine, Ser 1.66 (*)    GFR calc non Af Amer 39 (*)    GFR calc Af Amer 45 (*)    All other components within normal limits  CBC - Abnormal; Notable for the following components:   Platelets 149 (*)    All other components within normal limits  TROPONIN I - Abnormal; Notable for the following components:   Troponin I 0.07 (*)    All other components within normal limits  PROTIME-INR - Abnormal; Notable for the following components:   Prothrombin Time 24.5 (*)    All other components within normal limits  TROPONIN I - Abnormal; Notable for the following components:   Troponin I 0.08 (*)     All other components within normal limits  TROPONIN I - Abnormal; Notable for the following components:   Troponin I 0.09 (*)    All other components within normal limits  BRAIN NATRIURETIC PEPTIDE - Abnormal; Notable for the following components:   B Natriuretic Peptide 1,248.0 (*)    All other components within normal limits  GLUCOSE, CAPILLARY - Abnormal; Notable for the following components:   Glucose-Capillary 103 (*)    All other components within normal limits  URINALYSIS, COMPLETE (UACMP) WITH MICROSCOPIC - Abnormal; Notable for the following components:   Color, Urine AMBER (*)    APPearance HAZY (*)    Hgb urine dipstick MODERATE (*)    Leukocytes,Ua TRACE (*)    Non Squamous Epithelial PRESENT (*)    All other components within normal limits  URINE CULTURE  FIBRIN DERIVATIVES D-DIMER ([facility] ONLY)  TSH  TROPONIN I  BASIC METABOLIC PANEL  PROTIME-INR   ____________________________________________  EKG  EKG read interpreted by me shows bradycardia at a rate of 37 with a flutter now has right axis and right bundle branch block which is new ____________________________________________  RADIOLOGY  ED MD interpretation:    Official radiology report(s): Dg Chest 2 View  Result Date: 06/20/2018 CLINICAL DATA:  Dyspnea EXAM: CHEST - 2 VIEW COMPARISON:  01/10/2018 chest radiograph. FINDINGS: Cardiac valve prosthesis is in place. CABG clips overlie the mediastinum. Stable cardiomediastinal silhouette with moderate cardiomegaly. No pneumothorax. No right pleural effusion. Stable mild elevation of the left hemidiaphragm. Chronic mild blunting of left costophrenic angle, unchanged. No overt pulmonary edema. Mild left lung base atelectasis. No acute consolidative airspace disease. IMPRESSION: 1. Stable cardiomegaly without overt pulmonary edema. 2. Stable chronic blunting of the left costophrenic angle, probably due to pleural-parenchymal scarring, can not exclude a small left pleural  effusion. Electronically Signed   By: Ilona Sorrel M.D.   On: 06/20/2018 12:18    ____________________________________________   PROCEDURES  Procedure(s) performed:   Procedures  Critical Care performed:   ____________________________________________   INITIAL IMPRESSION / ASSESSMENT AND PLAN / ED COURSE    Patient with an episode of bradycardia and near syncope with turning blue.  He is very bradycardic here as well.  We will get him  in the hospital possibly he would benefit from pacemaker.       ____________________________________________   FINAL CLINICAL IMPRESSION(S) / ED DIAGNOSES  Final diagnoses:  Bradycardia  Near syncope     ED Discharge Orders    None       Note:  This document was prepared using Dragon voice recognition software and may include unintentional dictation errors.    Nena Polio, MD 06/20/18 2132

## 2018-06-20 NOTE — Consult Note (Signed)
Reason for Consult: Bradycardia atrial fibrillation Referring Physician: Dr. Genia Harold Dr. Johna Sheriff is an 78 y.o. male.  HPI: Patient presented with severe bradycardia rates in the 40s patient had been on Lopressor therapy.  Patient's had atrial fibrillation atrial flutter with bradycardia patient had cardiomyopathy congestive heart failure previous stroke.  Multivessel coronary disease with coronary bypass surgery aortic valve replacement bioprosthetic.  Patient has significant dementia so history is difficult but heart rate still in the 40s now here for further evaluation.  Patient has been assessed in the past for possible pacemaker been deferred.  And has been treated conservatively up until this point.  Past Medical History:  Diagnosis Date  . Anemia   . Aortic stenosis   . Arrhythmia   . Atrial fibrillation ([place])   . Bladder cancer ([place])   . Cardiomyopathy ([place])   . CHF (congestive heart failure) ([place])   . COPD (chronic obstructive pulmonary disease) ([place])   . Heartburn   . Stroke ([place])   . VRE (vancomycin resistant enterococcus) culture positive     Past Surgical History:  Procedure Laterality Date  . 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 CYSTOSCOPY W/ RETROGRADES Bilateral 08/14/2016   Procedure: CYSTOSCOPY WITH RETROGRADE PYELOGRAM;  Surgeon: Hollice Espy, MD;  Location: [facility] ORS;  Service: Urology;  Laterality: Bilateral;  . LEFT HEART CATH AND CORONARY ANGIOGRAPHY Left 09/05/2016   Procedure: Left Heart Cath and Coronary Angiography;  Surgeon: Teodoro Spray, MD;  Location: [place] CV LAB;  Service: Cardiovascular;  Laterality: Left;  . neck growth removal     1970's  . TONSILLECTOMY    . TRANSURETHRAL RESECTION OF BLADDER TUMOR N/A 08/14/2016   Procedure: TRANSURETHRAL RESECTION OF BLADDER TUMOR (TURBT);  Surgeon: Hollice Espy, MD;  Location: [facility] ORS;  Service: Urology;  Laterality: N/A;  . TRANSURETHRAL RESECTION  OF BLADDER TUMOR N/A 01/25/2018   Procedure: TRANSURETHRAL RESECTION OF BLADDER TUMOR (TURBT);  Surgeon: Billey Co, MD;  Location: [facility] ORS;  Service: Urology;  Laterality: N/A;    Family History  Problem Relation Age of Onset  . Heart failure Father   . Heart failure Brother   . Atrial fibrillation Sister   . Prostate cancer Neg Hx   . Kidney cancer Neg Hx   . Bladder Cancer Neg Hx     Social History:  reports that he has never smoked. He has never used smokeless tobacco. He reports that he does not drink alcohol or use drugs.  Allergies:  Allergies  Allergen Reactions  . Amiodarone     Other reaction(s): Dizziness  . Donepezil Palpitations    Severe bradycardia    Medications: I have reviewed the patient's current medications.  Results for orders placed or performed during the hospital encounter of 06/20/18 (from the past 48 hour(s))  Basic metabolic panel     Status: Abnormal   Collection Time: 06/20/18 11:54 AM  Result Value Ref Range   Sodium 133 (L) 135 - 145 mmol/L   Potassium 5.3 (H) 3.5 - 5.1 mmol/L   Chloride 103 98 - 111 mmol/L   CO2 19 (L) 22 - 32 mmol/L   Glucose, Bld 123 (H) 70 - 99 mg/dL   BUN 35 (H) 8 - 23 mg/dL   Creatinine, Ser 1.66 (H) 0.61 - 1.24 mg/dL   Calcium 9.0 8.9 - 10.3 mg/dL   GFR calc non Af Amer 39 (L) >60 mL/min   GFR  calc Af Amer 45 (L) >60 mL/min   Anion gap 11 5 - 15    Comment: Performed at [facility], [address] [place], [place] [address]  CBC     Status: Abnormal   Collection Time: 06/20/18 11:54 AM  Result Value Ref Range   WBC 8.6 4.0 - 10.5 K/uL   RBC 4.29 4.22 - 5.81 MIL/uL   Hemoglobin 13.6 13.0 - 17.0 g/dL   HCT 42.5 39.0 - 52.0 %   MCV 99.1 80.0 - 100.0 fL   MCH 31.7 26.0 - 34.0 pg   MCHC 32.0 30.0 - 36.0 g/dL   RDW 15.3 11.5 - 15.5 %   Platelets 149 (L) 150 - 400 K/uL   nRBC 0.0 0.0 - 0.2 %    Comment: Performed at [facility], [address] [place], [place] [address]  Troponin I  - ONCE - STAT     Status: Abnormal   Collection Time: 06/20/18 11:54 AM  Result Value Ref Range   Troponin I 0.07 (HH) <0.03 ng/mL    Comment: CRITICAL RESULT CALLED TO, READ BACK BY AND VERIFIED WITH SUSAN NEAL 06/20/18 1245 KLW Performed at [facility], [address] [place], [place] [address]   Protime-INR     Status: Abnormal   Collection Time: 06/20/18 12:33 PM  Result Value Ref Range   Prothrombin Time 24.5 (H) 11.4 - 15.2 seconds   INR 2.24     Comment: Performed at [facility], [address], [place], [place] [address]  Fibrin derivatives D-Dimer     Status: None   Collection Time: 06/20/18 12:33 PM  Result Value Ref Range   Fibrin derivatives D-dimer ([facility]) 450.08 0.00 - 499.00 ng/mL (FEU)    Comment: (NOTE) <> Exclusion of Venous Thromboembolism (VTE) - OUTPATIENT ONLY   (Emergency Department or [place])   0-499 ng/ml (FEU): With a low to intermediate pretest probability                      for VTE this test result excludes the diagnosis                      of VTE.   >499 ng/ml (FEU) : VTE not excluded; additional work up for VTE is                      required. <> Testing on Inpatients and Evaluation of Disseminated Intravascular   Coagulation (DIC) Reference Range:   0-499 ng/ml (FEU) Performed at [facility], [address] [place], [place] [address]   Glucose, capillary     Status: Abnormal   Collection Time: 06/20/18  3:58 PM  Result Value Ref Range   Glucose-Capillary 103 (H) 70 - 99 mg/dL  TSH     Status: None   Collection Time: 06/20/18  4:05 PM  Result Value Ref Range   TSH 3.580 0.350 - 4.500 uIU/mL    Comment: Performed by a 3rd Generation assay with a functional sensitivity of <=0.01 uIU/mL. Performed at [facility], [address] [place],  [address]   Troponin I - Now Then Q6H     Status: Abnormal   Collection Time: 06/20/18  4:05 PM  Result Value Ref Range   Troponin I 0.08 (HH) <0.03 ng/mL     Comment: CRITICAL VALUE NOTED. VALUE IS CONSISTENT WITH PREVIOUSLY REPORTED/CALLED VALUE.  JUW Performed at [facility], [phone]  [address] [place], [place] [address]   Brain natriuretic peptide     Status: Abnormal   Collection Time: 06/20/18  4:05 PM  Result Value Ref Range   B Natriuretic Peptide 1,248.0 (H) 0.0 - 100.0 pg/mL    Comment: Performed at [facility], [address] [place], [place] [address]    Dg Chest 2 View  Result Date: 06/20/2018 CLINICAL DATA:  Dyspnea EXAM: CHEST - 2 VIEW COMPARISON:  01/10/2018 chest radiograph. FINDINGS: Cardiac valve prosthesis is in place. CABG clips overlie the mediastinum. Stable cardiomediastinal silhouette with moderate cardiomegaly. No pneumothorax. No right pleural effusion. Stable mild elevation of the left hemidiaphragm. Chronic mild blunting of left costophrenic angle, unchanged. No overt pulmonary edema. Mild left lung base atelectasis. No acute consolidative airspace disease. IMPRESSION: 1. Stable cardiomegaly without overt pulmonary edema. 2. Stable chronic blunting of the left costophrenic angle, probably due to pleural-parenchymal scarring, can not exclude a small left pleural effusion. Electronically Signed   By: Ilona Sorrel M.D.   On: 06/20/2018 12:18    Review of Systems  Constitutional: Positive for malaise/fatigue.  HENT: Negative.   Eyes: Negative.   Respiratory: Negative.   Cardiovascular: Positive for palpitations.  Gastrointestinal: Negative.   Genitourinary: Negative.   Musculoskeletal: Negative.   Skin: Negative.   Neurological: Positive for weakness.  Endo/Heme/Allergies: Negative.   Psychiatric/Behavioral: Negative.    Blood pressure (!) 119/52, pulse (!) 31, temperature 97.6 F (36.4 C), temperature source Oral, resp. rate 15, weight 85.7 kg, SpO2 100 %. Physical Exam  Nursing note and vitals reviewed. Constitutional: He is oriented to person, place, and time. He appears well-developed  and well-nourished.  HENT:  Head: Normocephalic and atraumatic.  Eyes: Pupils are equal, round, and reactive to light. Conjunctivae and EOM are normal.  Neck: Normal range of motion. Neck supple.  Cardiovascular: S1 normal, S2 normal and normal pulses. An irregularly irregular rhythm present. Bradycardia present. PMI is displaced. Exam reveals gallop and S3.  Murmur heard. Respiratory: Effort normal and breath sounds normal.  GI: Soft. Bowel sounds are normal.  Musculoskeletal: Normal range of motion.  Neurological: He is alert and oriented to person, place, and time. He has normal reflexes.  Skin: Skin is warm and dry.  Psychiatric: He has a normal mood and affect.    Assessment/Plan: Bradycardia probably secondary to beta-blockade therapy Atrial fibrillation atrial flutter with bradycardia Cardiomyopathy Dementia Shortness of breath Weakness COPD Coronary artery disease CABG Aortic valve replacement bioprosthetic valve Coagulopathy secondary to Coumadin GERD Abnormal EKG . Plan Recommend admit to ICU and external plans Defer temporary pacemaker for bradycardia Hold Coumadin therapy in anticipation of  permanent pacemaker Recommend single-chamber pacemaker We will wait for Coumadin level and PT to return to normal prior to permanent pacemaker Heart failure therapy be continued including diuretics Hold beta-blockade therapy Lopressor hopefully heart rate will increase slowly Continue Pravachol therapy Agree with omeprazole therapy for reflux symptoms   Rees Matura D Amari Burnsworth 06/20/2018, 5:44 PM

## 2018-06-20 NOTE — ED Triage Notes (Signed)
PT to ED reporting an episode of SOB at home that has since subsided. Pt denies chest pain or dizziness throughout the episode but family reports pt was pale and had blue fingertips during the episode. PCP told pt to come to ED for evaluation. Pt continues to have increased WOB but denies feeling SOB. Hx of Afib , no known hx of Aflutter.

## 2018-06-21 DIAGNOSIS — N179 Acute kidney failure, unspecified: Secondary | ICD-10-CM

## 2018-06-21 DIAGNOSIS — E875 Hyperkalemia: Secondary | ICD-10-CM

## 2018-06-21 LAB — TROPONIN I: Troponin I: 0.09 ng/mL (ref <0.03)

## 2018-06-21 LAB — BASIC METABOLIC PANEL
Anion gap: 18 — ABNORMAL HIGH (ref 5–15)
Anion gap: 14 (ref 5–15)
BUN: 47 mg/dL — ABNORMAL HIGH (ref 8–23)
BUN: 50 mg/dL — ABNORMAL HIGH (ref 8–23)
CO2: 17 mmol/L — ABNORMAL LOW (ref 22–32)
CO2: 19 mmol/L — ABNORMAL LOW (ref 22–32)
Calcium: 9.5 mg/dL (ref 8.9–10.3)
Calcium: 9.3 mg/dL (ref 8.9–10.3)
Chloride: 101 mmol/L (ref 98–111)
Chloride: 104 mmol/L (ref 98–111)
Creatinine, Ser: 2.78 mg/dL — ABNORMAL HIGH (ref 0.61–1.24)
Creatinine, Ser: 2.8 mg/dL — ABNORMAL HIGH (ref 0.61–1.24)
GFR calc Af Amer: 24 mL/min — ABNORMAL LOW (ref >60)
GFR calc non Af Amer: 21 mL/min — ABNORMAL LOW (ref >60)
GFR, EST AFRICAN AMERICAN: 24 mL/min — AB (ref >60)
GFR, EST NON AFRICAN AMERICAN: 21 mL/min — AB (ref >60)
GLUCOSE: 97 mg/dL (ref 70–99)
Glucose, Bld: 109 mg/dL — ABNORMAL HIGH (ref 70–99)
Potassium: 6.8 mmol/L (ref 3.5–5.1)
Potassium: 5.1 mmol/L (ref 3.5–5.1)
Sodium: 136 mmol/L (ref 135–145)
Sodium: 137 mmol/L (ref 135–145)

## 2018-06-21 LAB — CBC
HCT: 44 % (ref 39.0–52.0)
Hemoglobin: 14.1 g/dL (ref 13.0–17.0)
MCH: 31.1 pg (ref 26.0–34.0)
MCHC: 32 g/dL (ref 30.0–36.0)
MCV: 97.1 fL (ref 80.0–100.0)
Platelets: 191 10*3/uL (ref 150–400)
RBC: 4.53 MIL/uL (ref 4.22–5.81)
RDW: 15.5 % (ref 11.5–15.5)
WBC: 13.1 10*3/uL — ABNORMAL HIGH (ref 4.0–10.5)
nRBC: 0 % (ref 0.0–0.2)

## 2018-06-21 LAB — URINE CULTURE
Culture: NO GROWTH
Special Requests: NORMAL

## 2018-06-21 LAB — PROTIME-INR
INR: 2.55
Prothrombin Time: 27.1 seconds — ABNORMAL HIGH (ref 11.4–15.2)

## 2018-06-21 LAB — BRAIN NATRIURETIC PEPTIDE: B Natriuretic Peptide: 1228 pg/mL — ABNORMAL HIGH (ref 0.0–100.0)

## 2018-06-21 MED ORDER — DEXTROSE 50 % IV SOLN
1.0000 | Freq: Once | INTRAVENOUS | Status: AC
  Administered 2018-06-21: 50 mL via INTRAVENOUS
  Filled 2018-06-21: qty 50

## 2018-06-21 MED ORDER — CALCIUM GLUCONATE-NACL 1-0.675 GM/50ML-% IV SOLN
1.0000 g | Freq: Once | INTRAVENOUS | Status: AC
  Administered 2018-06-21: 1000 mg via INTRAVENOUS
  Filled 2018-06-21: qty 50

## 2018-06-21 MED ORDER — INSULIN REGULAR HUMAN 100 UNIT/ML IJ SOLN
10.0000 [IU] | Freq: Once | INTRAMUSCULAR | Status: AC
  Administered 2018-06-21: 10 [IU] via INTRAVENOUS
  Filled 2018-06-21: qty 10

## 2018-06-21 MED ORDER — SODIUM CHLORIDE 0.9 % IV SOLN
1.0000 g | INTRAVENOUS | Status: DC
  Administered 2018-06-21 – 2018-06-22 (×2): 1 g via INTRAVENOUS
  Filled 2018-06-21: qty 1
  Filled 2018-06-21: qty 10
  Filled 2018-06-21: qty 1

## 2018-06-21 MED ORDER — SODIUM BICARBONATE 8.4 % IV SOLN
50.0000 meq | Freq: Once | INTRAVENOUS | Status: AC
  Administered 2018-06-21: 50 meq via INTRAVENOUS
  Filled 2018-06-21: qty 50

## 2018-06-21 MED ORDER — SODIUM POLYSTYRENE SULFONATE 15 GM/60ML PO SUSP
60.0000 g | Freq: Once | ORAL | Status: AC
  Administered 2018-06-21: 60 g via RECTAL
  Filled 2018-06-21: qty 240

## 2018-06-21 MED ORDER — SODIUM CHLORIDE 0.45 % IV SOLN
INTRAVENOUS | Status: DC
  Administered 2018-06-21 – 2018-06-23 (×3): via INTRAVENOUS

## 2018-06-21 NOTE — Progress Notes (Signed)
*  PRELIMINARY RESULTS* Echocardiogram 2D Echocardiogram has been performed.  Kyle Gregory 06/21/2018, 7:50 AM

## 2018-06-21 NOTE — Progress Notes (Signed)
CRITICAL VALUE ALERT  Critical Value:  Potassium 6.8  Date & Time Notied:  06/21/2018 at [place]  Provider Notified: Darel Hong, NP  Orders Received/Actions taken: Kayexalate, calcium gluconate, insulin/dextrose 50%, and sodium bicarbonate.  Cameron Ali, RN

## 2018-06-21 NOTE — Consult Note (Signed)
Date: 06/21/2018                  Patient Name:  Kyle Gregory  MRN: [id number]  DOB: 11-19-1940  Age / Sex: 78 y.o., male         PCP: Rusty Aus, MD                 Service Requesting Consult: IM/ Vaughan Basta, *                 Reason for Consult: ARF, hyperkalemia            History of Present Illness: Patient is a 78 y.o. male with medical problems of atrial fibrillation, aortic valve replacement with bioprosthetic valve, requiring chronic anticoagulation, COPD, CAD post CABG, dilated cardiomyopathy, who was admitted to [facility] on 06/20/2018 for evaluation of shortness of breath at home.  Evaluation in the emergency room showed severe bradycardia with a heart rate in the 20s to 30s.  He was given medical treatment with atropine and temporary pacemaker was placed Nephrology consult requested for increased creatinine as noted below  Baseline Cr 1.16/GFR > 60 from Apr 10, 2018 Results for DANZEL, MARSZALEK (MRN [id number]) as of 06/21/2018 08:51  Ref. Range 06/20/2018 11:54 06/21/2018 02:00 06/21/2018 05:59  Creatinine Latest Ref Range: 0.61 - 1.24 mg/dL 1.66 (H) 2.78 (H) 2.80 (H)    Medications: Outpatient medications: Medications Prior to Admission  Medication Sig Dispense Refill Last Dose  . Calcium Carbonate-Vitamin D (OYSTER SHELL CALCIUM 500 + D) 500-125 MG-UNIT TABS Take 1 tablet by mouth daily.    06/20/2018 at 0800  . ferrous sulfate 325 (65 FE) MG tablet Take 325 mg by mouth 2 (two) times daily with a meal.   06/20/2018 at 0800  . FLUoxetine (PROZAC) 20 MG capsule Take 20 mg by mouth daily.    06/20/2018 at 0800  . furosemide (LASIX) 40 MG tablet Take 1 tablet (40 mg total) by mouth daily. 30 tablet 0 06/20/2018 at 0800  . Melatonin 3 MG TABS Take 1 tablet by mouth at bedtime.   06/19/2018 at 2000  . nitroGLYCERIN (NITROSTAT) 0.4 MG SL tablet Place 0.4 mg under the tongue every 5 (five) minutes as needed for chest pain (Up to 3 doses).    prn at prn  . omeprazole  (PRILOSEC) 20 MG capsule Take 20 mg by mouth daily as needed (Heartburn or acid reflux).    06/20/2018 at 0800  . pravastatin (PRAVACHOL) 40 MG tablet Take 1 tablet by mouth at bedtime.   11 06/19/2018 at 2000  . spironolactone (ALDACTONE) 25 MG tablet Take 1 tablet by mouth daily.   06/19/2018 at 2000  . warfarin (COUMADIN) 2 MG tablet Take 4 mg by mouth as directed. Take 4 mg (2 tablets) daily  1 06/19/2018 at 2000  . zinc oxide ([employer] ZINC OXIDE) 20 % ointment Apply 1 application topically 2 (two) times daily as needed for irritation.    prn at prn    Current medications: Current Facility-Administered Medications  Medication Dose Route Frequency Provider Last Rate Last Dose  . acetaminophen (TYLENOL) tablet 650 mg  650 mg Oral Q6H PRN Bettey Costa, MD       Or  . acetaminophen (TYLENOL) suppository 650 mg  650 mg Rectal Q6H PRN Mody, Sital, MD      . bisacodyl (DULCOLAX) EC tablet 5 mg  5 mg Oral Daily PRN Bettey Costa, MD      .  calcium-vitamin D (OSCAL WITH D) 500-200 MG-UNIT per tablet 1 tablet  1 tablet Oral Daily Mody, Sital, MD      . ferrous sulfate tablet 325 mg  325 mg Oral BID WC Bettey Costa, MD   Stopped at 06/20/18 2034  . FLUoxetine (PROZAC) capsule 20 mg  20 mg Oral Daily Mody, Sital, MD      . HYDROcodone-acetaminophen (NORCO/VICODIN) 5-325 MG per tablet 1-2 tablet  1-2 tablet Oral Q4H PRN Bettey Costa, MD      . MEDLINE mouth rinse  15 mL Mouth Rinse BID Darel Hong D, NP   15 mL at 06/20/18 2136  . Melatonin TABS 5 mg  1 tablet Oral QHS Mody, Sital, MD      . nitroGLYCERIN (NITROSTAT) SL tablet 0.4 mg  0.4 mg Sublingual Q5 min PRN Mody, Sital, MD      . ondansetron (ZOFRAN) tablet 4 mg  4 mg Oral Q6H PRN Mody, Sital, MD       Or  . ondansetron (ZOFRAN) injection 4 mg  4 mg Intravenous Q6H PRN Bettey Costa, MD   4 mg at 06/20/18 1751  . pantoprazole (PROTONIX) EC tablet 40 mg  40 mg Oral Daily Mody, Sital, MD      . polyethylene glycol (MIRALAX / GLYCOLAX) packet 17 g  17 g Oral  Daily PRN Mody, Sital, MD      . pravastatin (PRAVACHOL) tablet 40 mg  40 mg Oral QHS Mody, Sital, MD      . sodium zirconium cyclosilicate (LOKELMA) packet 5 g  5 g Oral Once Mody, Sital, MD      . spironolactone (ALDACTONE) tablet 25 mg  25 mg Oral Daily Bettey Costa, MD          Allergies: Allergies  Allergen Reactions  . Amiodarone     Other reaction(s): Dizziness  . Donepezil Palpitations    Severe bradycardia      Past Medical History: Past Medical History:  Diagnosis Date  . Anemia   . Aortic stenosis   . Arrhythmia   . Atrial fibrillation ([place])   . Bladder cancer ([place])   . Cardiomyopathy ([place])   . CHF (congestive heart failure) ([place])   . COPD (chronic obstructive pulmonary disease) ([place])   . Heartburn   . Stroke ([place])   . VRE (vancomycin resistant enterococcus) culture positive      Past Surgical History: Past Surgical History:  Procedure Laterality Date  . 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 CYSTOSCOPY W/ RETROGRADES Bilateral 08/14/2016   Procedure: CYSTOSCOPY WITH RETROGRADE PYELOGRAM;  Surgeon: Hollice Espy, MD;  Location: [facility] ORS;  Service: Urology;  Laterality: Bilateral;  . LEFT HEART CATH AND CORONARY ANGIOGRAPHY Left 09/05/2016   Procedure: Left Heart Cath and Coronary Angiography;  Surgeon: Teodoro Spray, MD;  Location: [facility];  Service: Cardiovascular;  Laterality: Left;  . neck growth removal     1970's  . TONSILLECTOMY    . TRANSURETHRAL RESECTION OF BLADDER TUMOR N/A 08/14/2016   Procedure: TRANSURETHRAL RESECTION OF BLADDER TUMOR (TURBT);  Surgeon: Hollice Espy, MD;  Location: [facility] ORS;  Service: Urology;  Laterality: N/A;  . TRANSURETHRAL RESECTION OF BLADDER TUMOR N/A 01/25/2018   Procedure: TRANSURETHRAL RESECTION OF BLADDER TUMOR (TURBT);  Surgeon: Billey Co, MD;  Location: [facility] ORS;  Service: Urology;  Laterality: N/A;     Family History: Family History  Problem  Relation Age of Onset  .  Heart failure Father   . Heart failure Brother   . Atrial fibrillation Sister   . Prostate cancer Neg Hx   . Kidney cancer Neg Hx   . Bladder Cancer Neg Hx      Social History: Social History   Socioeconomic History  . Marital status: Married    Spouse name: Not on file  . Number of children: Not on file  . Years of education: Not on file  . Highest education level: High school graduate  Occupational History  . Occupation: retired  Scientific laboratory technician  . Financial resource strain: Not hard at all  . Food insecurity:    Worry: Never true    Inability: Never true  . Transportation needs:    Medical: No    Non-medical: No  Tobacco Use  . Smoking status: Never Smoker  . Smokeless tobacco: Never Used  Substance and Sexual Activity  . Alcohol use: No  . Drug use: No  . Sexual activity: Not Currently  Lifestyle  . Physical activity:    Days per week: 2 days    Minutes per session: 30 min  . Stress: Not at all  Relationships  . Social connections:    Talks on phone: Patient refused    Gets together: Patient refused    Attends religious service: Patient refused    Active member of club or organization: Patient refused    Attends meetings of clubs or organizations: Patient refused    Relationship status: Patient refused  . Intimate partner violence:    Fear of current or ex partner: Not on file    Emotionally abused: Not on file    Physically abused: Not on file    Forced sexual activity: Not on file  Other Topics Concern  . Not on file  Social History Narrative  . Not on file     Review of Systems: Patient is confused and not able to provide any meaningful information Gen:  HEENT:  CV:  Resp:  GI: GU :  MS:  Derm:   Psych: Heme:  Neuro:  Endocrine  Vital Signs: Blood pressure (!) 123/47, pulse (!) 32, temperature 98 F (36.7 C), temperature source Axillary, resp. rate 20, weight 85.7 kg, SpO2 95 %.   Intake/Output Summary (Last  24 hours) at 06/21/2018 0851 Last data filed at 06/21/2018 1914 Gross per 24 hour  Intake 442.89 ml  Output 260 ml  Net 182.89 ml    Weight trends: [employer]   06/20/18 1144  Weight: 85.7 kg    Physical Exam: General:  Frail, elderly gentleman, laying in the bed  HEENT  moist oral mucous membranes, decreased hearing  Neck:  No masses or JVD  Lungs:  Coarse breath sounds bilaterally, O2 [place]  Heart::  Irregular rhythm, temporary pacemaker  Abdomen:  Soft, nontender  Extremities:  Trace edema  Neurologic:  Alert, confused, able to follow few simple commands  Skin:  Scattered ecchymosis  Foley:  Catheter in place       Lab results: Basic Metabolic Panel: Recent Labs  Lab 06/20/18 1154 06/21/18 0200 06/21/18 0559  NA 133* 136 137  K 5.3* 6.8* 5.1  CL 103 101 104  CO2 19* 17* 19*  GLUCOSE 123* 109* 97  BUN 35* 47* 50*  CREATININE 1.66* 2.78* 2.80*  CALCIUM 9.0 9.5 9.3    Liver Function Tests: No results for input(s): AST, ALT, ALKPHOS, BILITOT, PROT, ALBUMIN in the last 168 hours. No results for input(s): LIPASE, AMYLASE in  the last 168 hours. No results for input(s): AMMONIA in the last 168 hours.  CBC: Recent Labs  Lab 06/20/18 1154 06/21/18 0200  WBC 8.6 13.1*  HGB 13.6 14.1  HCT 42.5 44.0  MCV 99.1 97.1  PLT 149* 191    Cardiac Enzymes: Recent Labs  Lab 06/21/18 0200  TROPONINI 0.09*    BNP: Invalid input(s): POCBNP  CBG: Recent Labs  Lab 06/20/18 1558  GLUCAP 103*    Microbiology: Recent Results (from the past 720 hour(s))  MRSA PCR Screening     Status: Abnormal   Collection Time: 06/20/18  4:52 PM  Result Value Ref Range Status   MRSA by PCR POSITIVE (A) NEGATIVE Final    Comment:        The GeneXpert MRSA Assay (FDA approved for NASAL specimens only), is one component of a comprehensive MRSA colonization surveillance program. It is not intended to diagnose MRSA infection nor to guide or monitor treatment for MRSA  infections. CRITICAL RESULT CALLED TO, READ BACK BY AND VERIFIED WITH: CALLED TO BRANDY MANSFIELD @1900  06/20/2018 [facility] Performed at [facility], [address] [place],  [address]      Coagulation Studies: Recent Labs    06/20/18 1233 06/21/18 0200  LABPROT 24.5* 27.1*  INR 2.24 2.55    Urinalysis: Recent Labs    06/20/18 1826  COLORURINE AMBER*  LABSPEC 1.018  PHURINE 5.0  GLUCOSEU NEGATIVE  HGBUR MODERATE*  BILIRUBINUR NEGATIVE  KETONESUR NEGATIVE  PROTEINUR NEGATIVE  NITRITE NEGATIVE  LEUKOCYTESUR TRACE*        Imaging: Dg Chest 2 View  Result Date: 06/20/2018 CLINICAL DATA:  Dyspnea EXAM: CHEST - 2 VIEW COMPARISON:  01/10/2018 chest radiograph. FINDINGS: Cardiac valve prosthesis is in place. CABG clips overlie the mediastinum. Stable cardiomediastinal silhouette with moderate cardiomegaly. No pneumothorax. No right pleural effusion. Stable mild elevation of the left hemidiaphragm. Chronic mild blunting of left costophrenic angle, unchanged. No overt pulmonary edema. Mild left lung base atelectasis. No acute consolidative airspace disease. IMPRESSION: 1. Stable cardiomegaly without overt pulmonary edema. 2. Stable chronic blunting of the left costophrenic angle, probably due to pleural-parenchymal scarring, can not exclude a small left pleural effusion. Electronically Signed   By: Ilona Sorrel M.D.   On: 06/20/2018 12:18      Assessment & Plan: Pt is a 78 y.o. Caucasian  male with atrial fibrillation, coronary disease, history of CABG May 2018, dilated cardiomyopathy, aortic valve replacement with (#23 Intuity bioprosthetic valve), chronic anticoagulation, history of bladder cancer, history of stroke June 2018 was admitted on 06/20/2018 with shortness of breath, paleness noted at home, blue fingertips, severe bradycardia.   1.  Acute kidney injury Baseline creatinine 1.66 at admission Increased significantly to 2.78.  Stabilizing today. Oliguric  with urine output of 245 cc so far Patient appears clinically dry.  Recommend maintenance IV fluids AKI appears to be secondary to ATN from hemodynamic instability, hypotension  2.  Severe hyperkalemia Patient was on Spironolactone at home.  Agree with discontinuing Treated with medical shifting measures.  Repeat potassium improved this morning Agree with maintenance Lokelma  We will continue to monitor and follow along.  No acute indication for dialysis at present.  3.  Cardiac history- atrial fibrillation, flutter, h/o bradycardia, history of dilated cardiomyopathy, CHF, aortic valve replacement, three-vessel CABG May 2018 at [facility], peripheral arterial disease, post CABG stroke.  Last outpatient echo from August 2019 shows severe LV systolic dysfunction with mild LVH, moderate RV systolic dysfunction, mild valvular  regurgitation, EF 20 to 25%     LOS: [place] 2/21/20208:51 AM  [place], [place]  Note: This note was prepared with Dragon dictation. Any transcription errors are unintentional

## 2018-06-21 NOTE — Care Management Note (Signed)
Case Management Note  Patient Details  Name: Kyle Gregory MRN: [id number] Date of Birth: April 15, 1941  Subjective/Objective: Patient was admitted for hypotension and bradycardia which was noticed by patient home health nurse. Suffers from dementia and lives at home with his wife [name] [phone] and she is his primary caregiver. Patient is open to nursing and physical therapy services via [facility]. Spouse has also hired private care services multiple times a week which she reports has helped her significantly. Patient has a walker in the home. Spouse would like to continue the services and transition of care plan that was set in place. Spouse provides transport. PCP is Dr Emily Filbert. [facility] [address] and has no issues obtaining medications.                  Action/Plan: Notified [facility] hat patient was here. Will look to place resumption orders at discharge. RNCM to continue to follow for any needs.                      Expected Discharge Date:                  Expected Discharge Plan:     In-House Referral:     Discharge planning Services     Post Acute Care Choice:    Choice offered to:     DME Arranged:    DME Agency:     HH Arranged:    HH Agency:     Status of Service:     If discussed at [employer], dates discussed:    Additional Comments:  Latanya Maudlin, RN 06/21/2018, 11:10 AM

## 2018-06-21 NOTE — Progress Notes (Signed)
No distress, no new complaints Percutaneous pacemaker is not capturing or sensing Underlying rhythm is atrial fibrillation with variable conduction. Ventricular rate in high 30s, low 40s.  BP okay with this.  Vitals:   06/21/18 1245 06/21/18 1345 06/21/18 1400 06/21/18 1437  BP: (!) 80/34 (!) 75/35 93/61 (!) 101/53  Pulse: (!) 32 (!) 31 95 77  Resp: 13 15 18 12   Temp:      TempSrc:      SpO2: 100% 97% 100% 100%  Weight:        Gen: NAD HEENT: NCAT, sclera white Neck: No JVD Lungs: breath sounds full, no wheezes or other adventitious sounds Cardiovascular: Bradycardia, irregular, no M noted Abdomen: Soft, nontender, normal BS Ext: without clubbing, cyanosis, edema Neuro: grossly intact Skin: Limited exam, no lesions noted   BMP Latest Ref Rng & Units 06/21/2018 06/21/2018 06/20/2018  Glucose 70 - 99 mg/dL 97 109(H) 123(H)  BUN 8 - 23 mg/dL 50(H) 47(H) 35(H)  Creatinine 0.61 - 1.24 mg/dL 2.80(H) 2.78(H) 1.66(H)  Sodium 135 - 145 mmol/L 137 136 133(L)  Potassium 3.5 - 5.1 mmol/L 5.1 6.8(HH) 5.3(H)  Chloride 98 - 111 mmol/L 104 101 103  CO2 22 - 32 mmol/L 19(L) 17(L) 19(L)  Calcium 8.9 - 10.3 mg/dL 9.3 9.5 9.0   CBC Latest Ref Rng & Units 06/21/2018 06/20/2018 04/10/2018  WBC 4.0 - 10.5 K/uL 13.1(H) 8.6 7.0  Hemoglobin 13.0 - 17.0 g/dL 14.1 13.6 14.3  Hematocrit 39.0 - 52.0 % 44.0 42.5 43.3  Platelets 150 - 400 K/uL 191 149(L) 113(L)   CXR: No acute findings.  Mildly elevated left hemidiaphragm appears to be chronic  IMPRESSION: Chronic atrial fibrillation/flutter Status post prosthetic valve Chronic warfarin High-grade AV block Cardiology following.  Will likely need PPM AKI Hyperkalemia Patient is DNR/DNI Discussed with Dr. Clayborn Bigness.  Okay to transfer to telemetry floor  PLAN/REC: Transfer to telemetry floor Keep transcutaneous pacemaker in standby mode Continue to hold warfarin and monitor prothrombin times Nephrology consultation has been requested Discontinue  spironolactone Holding beta-blocker  After transfer, PCCM will sign off. Please call if we can be of further assistance.  Merton Border, MD PCCM service Mobile [phone] Pager [phone] 06/21/2018 3:50 PM

## 2018-06-21 NOTE — Progress Notes (Signed)
[place] [facility] [place] NAME: Kyle Gregory    MR#:  [id number]  DATE OF BIRTH:  03-07-1941  SUBJECTIVE:  CHIEF COMPLAINT:   Chief Complaint  Patient presents with  . Atrial Flutter  . Shortness of Breath   Brought with weakness and confusion.  Noted to have bradycardia.  Remains stable in ICU with transcutaneous pacer.  Blood pressure is stable.  Patient is more confused and cannot carry on any communications.  His sister was present in the room during my visit. REVIEW OF SYSTEMS:  Patient is confused and cannot give review of systems reliably.  ROS  DRUG ALLERGIES:   Allergies  Allergen Reactions  . Amiodarone     Other reaction(s): Dizziness  . Donepezil Palpitations    Severe bradycardia    VITALS:  Blood pressure (!) 102/49, pulse (!) 31, temperature 98.9 F (37.2 C), temperature source Oral, resp. rate 17, weight 85.7 kg, SpO2 97 %.  PHYSICAL EXAMINATION:  GENERAL:  78 y.o.-year-old patient lying in the bed with no acute distress.  EYES: Pupils equal, round, reactive to light and accommodation. No scleral icterus. Extraocular muscles intact.  HEENT: Head atraumatic, normocephalic. Oropharynx and nasopharynx clear.  NECK:  Supple, no jugular venous distention. No thyroid enlargement, no tenderness.  LUNGS: Normal breath sounds bilaterally, no wheezing, rales,rhonchi or crepitation. No use of accessory muscles of respiration.  CARDIOVASCULAR: S1, S2 slow. No murmurs, rubs, or gallops.  ABDOMEN: Soft, nontender, nondistended. Bowel sounds present. No organomegaly or mass.  EXTREMITIES: No pedal edema, cyanosis, or clubbing.  NEUROLOGIC: Cranial nerves II through XII are intact. Muscle strength 4/5 in all extremities. Sensation intact. Gait not checked.  PSYCHIATRIC: The patient is alert and oriented x 0.  He is talking irrelevant things but not answering the questions properly. SKIN: No obvious rash, lesion, or ulcer.   Physical  Exam LABORATORY PANEL:   CBC Recent Labs  Lab 06/21/18 0200  WBC 13.1*  HGB 14.1  HCT 44.0  PLT 191   ------------------------------------------------------------------------------------------------------------------  Chemistries  Recent Labs  Lab 06/21/18 0559  NA 137  K 5.1  CL 104  CO2 19*  GLUCOSE 97  BUN 50*  CREATININE 2.80*  CALCIUM 9.3   ------------------------------------------------------------------------------------------------------------------  Cardiac Enzymes Recent Labs  Lab 06/20/18 1942 06/21/18 0200  TROPONINI 0.09* 0.09*   ------------------------------------------------------------------------------------------------------------------  RADIOLOGY:  Dg Chest 2 View  Result Date: 06/20/2018 CLINICAL DATA:  Dyspnea EXAM: CHEST - 2 VIEW COMPARISON:  01/10/2018 chest radiograph. FINDINGS: Cardiac valve prosthesis is in place. CABG clips overlie the mediastinum. Stable cardiomediastinal silhouette with moderate cardiomegaly. No pneumothorax. No right pleural effusion. Stable mild elevation of the left hemidiaphragm. Chronic mild blunting of left costophrenic angle, unchanged. No overt pulmonary edema. Mild left lung base atelectasis. No acute consolidative airspace disease. IMPRESSION: 1. Stable cardiomegaly without overt pulmonary edema. 2. Stable chronic blunting of the left costophrenic angle, probably due to pleural-parenchymal scarring, can not exclude a small left pleural effusion. Electronically Signed   By: Ilona Sorrel M.D.   On: 06/20/2018 12:18    ASSESSMENT AND PLAN:   Active Problems:   Bradycardia   78 year old male with history of chronic atrial fibrillation and aortic valve replacement on Coumadin who presents from home with shortness of breath and found to have bradycardia.  1.  Symptomatic bradycardia:  Intensivist consult and [facility] cardi[facility] consultation appreciated Continue telemetry Hold beta-blocker and checked TSH Continue   transcutaneous pacemaker PRN atropine Check echo Per cardiology  plan is to put a permanent pacemaker once his INR is normal.  Holding Coumadin for now.  2.  Elevated troponin: Likely due to demand ischemia from symptomatic bradycardia Follow telemetry and troponins Follow-up on echo  3.   hyperkalemia: One-time dose of lokelma-went high again but finally after interventions came down.  4.  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  History of chronic atrial fibrillation: Patient is now in a flutter with slow heart rate Holding metoprolol Coumadin holding due to need of permanent pacemaker placement.  6.  Heart valve replacement: Holding Coumadin for now.  7.  CAD: Continue pravastatin  8.  Acute worsening on CKD stage III Nephrology consult appreciated, continue to monitor.  9.  Altered mental status-metabolic encephalopathy due to infection and bradycardia Worsening renal function Start on Rocephin for UTI and follow cultures.  10.  UTI Rocephin for now and follow cultures.  All the records are reviewed and case discussed with Care Management/Social Workerr. Management plans discussed with the patient, family and they are in agreement.  CODE STATUS: DNR  TOTAL TIME TAKING CARE OF THIS PATIENT: 35* minutes.   Talk to his sister in the room.  POSSIBLE D/C IN 1-2* DAYS, DEPENDING ON CLINICAL CONDITION.   Vaughan Basta M.D on 06/21/2018   Between 7am to 6pm - Pager - [phone]  After 6pm go to [email or web address] - password [email or web address] [facility]  Office  [phone]  CC: Primary care physician; Rusty Aus, MD  Note: This dictation was prepared with Dragon dictation along with smaller phrase technology. Any transcriptional errors that result  from this process are unintentional.

## 2018-06-22 ENCOUNTER — Encounter
Admission: EM | Disposition: A | Discharge status: Home / Self Care | Payer: Self-pay | Source: Home / Self Care | Attending: Internal Medicine

## 2018-06-22 HISTORY — PX: TEMPORARY PACEMAKER: CATH118268

## 2018-06-22 LAB — CBC
HCT: 37.5 % — ABNORMAL LOW (ref 39.0–52.0)
HEMOGLOBIN: 12.1 g/dL — AB (ref 13.0–17.0)
MCH: 32 pg (ref 26.0–34.0)
MCHC: 32.3 g/dL (ref 30.0–36.0)
MCV: 99.2 fL (ref 80.0–100.0)
Platelets: 129 10*3/uL — ABNORMAL LOW (ref 150–400)
RBC: 3.78 MIL/uL — ABNORMAL LOW (ref 4.22–5.81)
RDW: 15.7 % — ABNORMAL HIGH (ref 11.5–15.5)
WBC: 10.6 10*3/uL — AB (ref 4.0–10.5)
nRBC: 0 % (ref 0.0–0.2)

## 2018-06-22 LAB — GLUCOSE, CAPILLARY
Glucose-Capillary: 92 mg/dL (ref 70–99)
Glucose-Capillary: 118 mg/dL — ABNORMAL HIGH (ref 70–99)

## 2018-06-22 LAB — BASIC METABOLIC PANEL
Anion gap: 12 (ref 5–15)
BUN: 72 mg/dL — ABNORMAL HIGH (ref 8–23)
CALCIUM: 8.7 mg/dL — AB (ref 8.9–10.3)
CO2: 22 mmol/L (ref 22–32)
Chloride: 102 mmol/L (ref 98–111)
Creatinine, Ser: 3.75 mg/dL — ABNORMAL HIGH (ref 0.61–1.24)
GFR calc non Af Amer: 15 mL/min — ABNORMAL LOW (ref >60)
GFR, EST AFRICAN AMERICAN: 17 mL/min — AB (ref >60)
Glucose, Bld: 97 mg/dL (ref 70–99)
Potassium: 4.9 mmol/L (ref 3.5–5.1)
Sodium: 136 mmol/L (ref 135–145)

## 2018-06-22 LAB — PROTIME-INR
INR: 2.76
Prothrombin Time: 28.8 seconds — ABNORMAL HIGH (ref 11.4–15.2)

## 2018-06-22 SURGERY — TEMPORARY PACEMAKER

## 2018-06-22 MED ORDER — SODIUM CHLORIDE 0.9 % IV SOLN
INTRAVENOUS | Status: AC | PRN
  Administered 2018-06-22: 50 mL/h via INTRAVENOUS
  Administered 2018-06-22: 10 mL/h via INTRAVENOUS

## 2018-06-22 MED ORDER — MIDAZOLAM HCL 2 MG/2ML IJ SOLN
INTRAMUSCULAR | Status: AC
  Filled 2018-06-22: qty 2

## 2018-06-22 MED ORDER — FENTANYL CITRATE (PF) 100 MCG/2ML IJ SOLN
INTRAMUSCULAR | Status: AC
  Filled 2018-06-22: qty 2

## 2018-06-22 MED ORDER — DOPAMINE-DEXTROSE 3.2-5 MG/ML-% IV SOLN: 0.0000 ug/kg/min | INTRAVENOUS | Status: DC

## 2018-06-22 MED ORDER — HEPARIN (PORCINE) IN NACL 1000-0.9 UT/500ML-% IV SOLN
INTRAVENOUS | Status: AC
  Filled 2018-06-22: qty 1000

## 2018-06-22 SURGICAL SUPPLY — 7 items
CABLE ADAPT CONN TEMP 6FT (ADAPTER) ×3 IMPLANT
KIT MANI 3VAL PERCEP (MISCELLANEOUS) ×3 IMPLANT
NEEDLE PERC 18GX7CM (NEEDLE) ×3 IMPLANT
PACK CARDIAC CATH (CUSTOM PROCEDURE TRAY) ×3 IMPLANT
SHEATH AVANTI 6FR X 11CM (SHEATH) ×3 IMPLANT
SLEEVE REPOSITIONING LENGTH 30 (MISCELLANEOUS) ×3 IMPLANT
WIRE PACING TEMP ST TIP 5 (CATHETERS) ×3 IMPLANT

## 2018-06-22 NOTE — Progress Notes (Signed)
Patient is floor status awaiting transfer to telemetry bed.  PCCM has not rounded on him officially for this day.  We are available as needed.  Merton Border, MD PCCM service Mobile [phone] Pager [phone] 06/22/2018 1:41 PM

## 2018-06-22 NOTE — Progress Notes (Signed)
Subjective:  Patient resting comfortably in bed denies any complaints no vertigo no dizziness no chest pain no syncope  Objective:  Vital Signs in the last 24 hours: Temp:  [97.6 F (36.4 C)-99.1 F (37.3 C)] 97.6 F (36.4 C) (02/22 0800) Pulse Rate:  [30-113] 30 (02/22 0900) Resp:  [11-23] 18 (02/22 0900) BP: (75-117)/(34-73) 103/52 (02/22 0900) SpO2:  [90 %-100 %] 100 % (02/22 0900)  Intake/Output from previous day: 02/21 0701 - 02/22 0700 In: 1155.4 [P.O.:60; I.V.:995.4; IV Piggyback:100] Out: 717 [Urine:217; Stool:500] Intake/Output from this shift: Total I/O In: 50 [P.O.:50] Out: -   Physical Exam: General appearance: cooperative, appears stated age and slowed mentation Neck: no adenopathy, no carotid bruit, no JVD, supple, symmetrical, trachea midline and thyroid not enlarged, symmetric, no tenderness/mass/nodules Lungs: clear to auscultation bilaterally Heart: Bradycardic irregularly irregular Abdomen: soft, non-tender; bowel sounds normal; no masses,  no organomegaly Extremities: extremities normal, atraumatic, no cyanosis or edema Pulses: 2+ and symmetric Skin: Skin color, texture, turgor normal. No rashes or lesions Neurologic: Mental status: Alert, oriented, thought content appropriate, Patient is alert arousable but slightly confused with chronic dementia  Lab Results: Recent Labs    06/21/18 0200 06/22/18 0424  WBC 13.1* 10.6*  HGB 14.1 12.1*  PLT 191 129*   Recent Labs    06/21/18 0559 06/22/18 0424  NA 137 136  K 5.1 4.9  CL 104 102  CO2 19* 22  GLUCOSE 97 97  BUN 50* 72*  CREATININE 2.80* 3.75*   Recent Labs    06/20/18 1942 06/21/18 0200  TROPONINI 0.09* 0.09*   Hepatic Function Panel No results for input(s): PROT, ALBUMIN, AST, ALT, ALKPHOS, BILITOT, BILIDIR, IBILI in the last 72 hours. No results for input(s): CHOL in the last 72 hours. No results for input(s): PROTIME in the last 72 hours.  Imaging: Imaging results have been  reviewed  Cardiac Studies:  Assessment/Plan:  Bradycardia Arrhythmia Atrial Fibrillation CABG Cardiomyopathy Coronary Artery Disease Palpitations Valvular Disease  Chronic renal insufficiency Borderline troponins Dementia . Plan Agree with ICU level care Continue to hold Coumadin therapy for anticoagulation Recommend permanent pacemaker prior to discharge Single-lead pacer since the patient is in A. fib chronically Continue dementia management and control Continue switching to Eliquis after the procedure instead of Coumadin Have the patient follow-up with nephrology for significant renal sufficiency Not recommend temporary pacemaker at this point since the patient is relatively hemodynamically stable Pacemaker scheduled Monday with Dr. Saralyn Pilar at around noon  LOS: 2 days     D  06/22/2018, 10:36 AM

## 2018-06-22 NOTE — Progress Notes (Signed)
Dr Clayborn Bigness notified of patients HR as low as 17, sustaining in mid 20's. Pt with reported "heaviness to breath". Dr Clayborn Bigness verbalized to place pt on external pacing pads and that he will come see patient. Wife has been updated at bedside.

## 2018-06-22 NOTE — Progress Notes (Signed)
[facility], [place] 06/22/18  Subjective:   Patient is more interactive today although the status is not quite back to baseline.  Able to answer questions.  Denies any shortness of breath. Urine output remains poor at 217 cc.  Serum creatinine has further increased to 3.75 Potassium is normal at 4.9  Objective:  Vital signs in last 24 hours:  Temp:  [97.6 F (36.4 C)-99.1 F (37.3 C)] 97.6 F (36.4 C) (02/22 0800) Pulse Rate:  [29-126] 29 (02/22 1200) Resp:  [12-23] 17 (02/22 1200) BP: (89-117)/(41-73) 103/46 (02/22 1100) SpO2:  [90 %-100 %] 99 % (02/22 1200)  Weight change:  Filed Weights   06/20/18 1144  Weight: 85.7 kg    Intake/Output:    Intake/Output Summary (Last 24 hours) at 06/22/2018 1355 Last data filed at 06/22/2018 0900 Gross per 24 hour  Intake 1205.35 ml  Output 685 ml  Net 520.35 ml     Physical Exam: General:  Frail, elderly gentleman, laying in the bed  HEENT  moist oral mucous membranes, decreased hearing  Neck:  No masses or JVD  Lungs:  Coarse breath sounds bilaterally, O2 [place]  Heart::  Irregular rhythm, atrial flutter, temporary pacemaker  Abdomen:  Soft, nontender  Extremities:  Trace edema  Neurologic:  Alert, oriented to self, able to follow commands  Skin:  Scattered ecchymosis  Foley:  Catheter in place     Basic Metabolic Panel:  Recent Labs  Lab 06/20/18 1154 06/21/18 0200 06/21/18 0559 06/22/18 0424  NA 133* 136 137 136  K 5.3* 6.8* 5.1 4.9  CL 103 101 104 102  CO2 19* 17* 19* 22  GLUCOSE 123* 109* 97 97  BUN 35* 47* 50* 72*  CREATININE 1.66* 2.78* 2.80* 3.75*  CALCIUM 9.0 9.5 9.3 8.7*     CBC: Recent Labs  Lab 06/20/18 1154 06/21/18 0200 06/22/18 0424  WBC 8.6 13.1* 10.6*  HGB 13.6 14.1 12.1*  HCT 42.5 44.0 37.5*  MCV 99.1 97.1 99.2  PLT 149* 191 129*     No results found for: HEPBSAG, HEPBSAB, HEPBIGM    Microbiology:  Recent Results (from the past 240 hour(s))  MRSA  PCR Screening     Status: Abnormal   Collection Time: 06/20/18  4:52 PM  Result Value Ref Range Status   MRSA by PCR POSITIVE (A) NEGATIVE Final    Comment:        The GeneXpert MRSA Assay (FDA approved for NASAL specimens only), is one component of a comprehensive MRSA colonization surveillance program. It is not intended to diagnose MRSA infection nor to guide or monitor treatment for MRSA infections. CRITICAL RESULT CALLED TO, READ BACK BY AND VERIFIED WITH: CALLED TO BRANDY MANSFIELD @1900  06/20/2018 [facility] Performed at [facility], [address], [place], [place] [address]   Urine Culture     Status: None   Collection Time: 06/20/18  6:26 PM  Result Value Ref Range Status   Specimen Description   Final    URINE, CATHETERIZED Performed at [facility], [address], [place], [place] [address]    Special Requests   Final    Normal Performed at [facility], [address], [place], [place] [address]    Culture   Final    NO GROWTH Performed at [facility], [place] [address] [place], [place] [address]    Report Status 06/21/2018 FINAL  Final    Coagulation Studies: Recent Labs    06/20/18 1233  06/21/18 0200 06/22/18 0424  LABPROT 24.5* 27.1* 28.8*  INR 2.24 2.55 2.76    Urinalysis: Recent Labs    06/20/18 1826  COLORURINE AMBER*  LABSPEC 1.018  PHURINE 5.0  GLUCOSEU NEGATIVE  HGBUR MODERATE*  BILIRUBINUR NEGATIVE  KETONESUR NEGATIVE  PROTEINUR NEGATIVE  NITRITE NEGATIVE  LEUKOCYTESUR TRACE*      Imaging: No results found.   Medications:   . sodium chloride 50 mL/hr at 06/22/18 0729  . cefTRIAXone (ROCEPHIN)  IV Stopped (06/21/18 1950)   . calcium-vitamin D  1 tablet Oral Daily  . ferrous sulfate  325 mg Oral BID WC  . FLUoxetine  20 mg Oral Daily  . mouth rinse  15 mL Mouth Rinse BID  . Melatonin  1 tablet Oral QHS  . pantoprazole  40 mg Oral Daily  . pravastatin  40 mg Oral QHS  . sodium zirconium  cyclosilicate  5 g Oral Once   acetaminophen **OR** acetaminophen, bisacodyl, HYDROcodone-acetaminophen, nitroGLYCERIN, [DISCONTINUED] ondansetron **OR** ondansetron (ZOFRAN) IV, polyethylene glycol  Assessment/ Plan:  78 y.o. 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   1.  Acute kidney injury Baseline creatinine 1.66 at admission Patient with oliguric urine output and worsening creatinine to 3.75 today Agree with maintenance IV fluids AKI appears to be secondary to ATN from hemodynamic instability, hypotension No acute indication for dialysis at present but may need it if urine output/renal parameters do not improve  2.  Severe hyperkalemia Patient was on Spironolactone at home.  Agree with discontinuing Treated with medical shifting measures.    Potassium level has now improved to 4.9 Agree with maintenance Lokelma  3.  Cardiac history- atrial fibrillation, flutter, h/o bradycardia, history of dilated cardiomyopathy, CHF, aortic valve replacement, three-vessel CABG May 2018 at [facility], peripheral arterial disease, post CABG stroke.  Last outpatient echo from August 2019 shows severe LV systolic dysfunction with mild LVH, moderate RV systolic dysfunction, mild valvular regurgitation, EF 20 to 25%.  Now admitted for severe bradycardia    LOS: 2 Wai Litt 2/22/20201:55 PM  [place], [place]  Note: This note was prepared with Dragon dictation. Any transcription errors are unintentional

## 2018-06-22 NOTE — CV Procedure (Signed)
Temporary pacemaker placement inpatient indication severe bradycardia with A. Fib Consent was obtained from wife and patient case was discussed with some benefits explained Patient was brought down from ICU because of symptomatic bradycardia he is preop for permanent pacemaker Patient lost capture with external Rates in the teens and 20s highest rate was less than 40 Patient was brought to the cardiac Cath Lab Blood pressure was stable at 100/70 heart rate of 28 respiratory rate of 16 Patient was alert cooperative Initially we anesthetize his right groin area with 20 cc of 1% lidocaine We then inserted a 7 French sheath into the right femoral vein Following that we floated a 5 [place] temporary pacemaker wire into the RV Connected to the temporary pacer and set the rate at 60 Patient never lost capture until 0.4 so he was placed on 2 Pacer was sutured in place OpSite was used to cover the area Patient was then transferred back to ICU to remain in bed until [place] with replacement by Dr. Saralyn Pilar on Monday Conclusion Accessible placement of temporary pacemaker from right groin area

## 2018-06-22 NOTE — Progress Notes (Signed)
[place] at [place] NAME: Kyle Gregory    MR#:  [id number]  DATE OF BIRTH:  11-19-1940  SUBJECTIVE:  CHIEF COMPLAINT:   Chief Complaint  Patient presents with  . Atrial Flutter  . Shortness of Breath   The patient has no complaints.  But heart rate is still at the 30s, down to 29. REVIEW OF SYSTEMS:   Review of Systems  Constitutional: Negative for chills, fever and malaise/fatigue.  HENT: Negative for sore throat.   Eyes: Negative for blurred vision and double vision.  Respiratory: Negative for cough, hemoptysis, shortness of breath, wheezing and stridor.   Cardiovascular: Negative for chest pain, palpitations, orthopnea and leg swelling.  Gastrointestinal: Negative for abdominal pain, blood in stool, diarrhea, melena, nausea and vomiting.  Genitourinary: Negative for dysuria, flank pain and hematuria.  Musculoskeletal: Negative for back pain and joint pain.  Skin: Negative for rash.  Neurological: Negative for dizziness, sensory change, focal weakness, seizures, loss of consciousness, weakness and headaches.  Endo/Heme/Allergies: Negative for polydipsia.  Psychiatric/Behavioral: Negative for depression. The patient is not nervous/anxious.     DRUG ALLERGIES:   Allergies  Allergen Reactions  . Amiodarone     Other reaction(s): Dizziness  . Donepezil Palpitations    Severe bradycardia    VITALS:  Blood pressure (!) 103/46, pulse (!) 29, temperature 97.6 F (36.4 C), temperature source Oral, resp. rate 17, weight 85.7 kg, SpO2 95 %.  PHYSICAL EXAMINATION:  GENERAL:  78 y.o.-year-old patient lying in the bed with no acute distress.   Physical Exam Constitutional:      General: He is not in acute distress.    Appearance: Normal appearance.  HENT:     Head: Normocephalic.     Mouth/Throat:     Mouth: Mucous membranes are moist.  Eyes:     General: No scleral icterus.    Conjunctiva/sclera: Conjunctivae normal.   Pupils: Pupils are equal, round, and reactive to light.  Neck:     Musculoskeletal: Normal range of motion and neck supple.     Vascular: No JVD.     Trachea: No tracheal deviation.  Cardiovascular:     Rate and Rhythm: Bradycardia present.     Heart sounds: Normal heart sounds. No murmur. No gallop.   Pulmonary:     Effort: Pulmonary effort is normal. No respiratory distress.     Breath sounds: Normal breath sounds. No wheezing or rales.  Abdominal:     General: Bowel sounds are normal. There is no distension.     Palpations: Abdomen is soft.     Tenderness: There is no abdominal tenderness. There is no rebound.  Musculoskeletal: Normal range of motion.        General: No tenderness.     Right lower leg: No edema.     Left lower leg: No edema.  Skin:    Findings: No erythema or rash.  Neurological:     General: No focal deficit present.     Mental Status: He is alert and oriented to person, place, and time.     Cranial Nerves: No cranial nerve deficit.  Psychiatric:        Mood and Affect: Mood normal.    LABORATORY PANEL:   CBC Recent Labs  Lab 06/22/18 0424  WBC 10.6*  HGB 12.1*  HCT 37.5*  PLT 129*   ------------------------------------------------------------------------------------------------------------------  Chemistries  Recent Labs  Lab 06/22/18 0424  NA 136  K 4.9  CL 102  CO2 22  GLUCOSE 97  BUN 72*  CREATININE 3.75*  CALCIUM 8.7*   ------------------------------------------------------------------------------------------------------------------  Cardiac Enzymes Recent Labs  Lab 06/20/18 1942 06/21/18 0200  TROPONINI 0.09* 0.09*   ------------------------------------------------------------------------------------------------------------------  RADIOLOGY:  No results found.  ASSESSMENT AND PLAN:   Active Problems:   Bradycardia   78 year old male with history of chronic atrial fibrillation and aortic valve replacement on Coumadin  who presents from home with shortness of breath and found to have bradycardia.  1.  Symptomatic bradycardia:  Intensivist consult and [facility] cardiology consultation appreciated Continue telemetry Hold beta-blocker and continue  transcutaneous pacemaker PRN atropine Echo is pending. Per cardiology plan is to put a permanent pacemaker once his INR is normal.  Holding Coumadin for now. Per Dr. Clayborn Bigness, a decision was made to finally put in a temporary until the permanent is done on Monday.   2.  Elevated troponin: Likely due to demand ischemia from symptomatic bradycardia Follow-up echo.  3.   hyperkalemia: One-time dose of lokelma Improved.  4.  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  History of chronic atrial fibrillation: Patient is now in a flutter with slow heart rate Holding metoprolol Coumadin holding due to need of permanent pacemaker placement.  6.  Heart valve replacement: Holding Coumadin for now.  7.  CAD: Continue pravastatin  8.  Acute worsening on CKD stage III Nephrology consult appreciated, continue to monitor.  9.  Altered mental status-metabolic encephalopathy due to infection and bradycardia. Mental status improved.  10.  UTI Rocephin for now and follow cultures: no growth.no growth..  All the records are reviewed and case discussed with Care Management/Social Workerr. Management plans discussed with the patient, his wife and they are in agreement.  CODE STATUS: DNR  TOTAL TIME TAKING CARE OF THIS PATIENT: 35 minutes.   Talk to his sister in the room.  POSSIBLE D/C IN 3 DAYS, DEPENDING ON CLINICAL CONDITION.   [name] M.D on 06/22/2018   Between 7am to 6pm - Pager - [phone]  After 6pm go to [email or web address] - password [email or web address] [facility]  Office  [phone]  CC: Primary care  physician; Rusty Aus, MD  Note: This dictation was prepared with Dragon dictation along with smaller phrase technology. Any transcriptional errors that result from this process are unintentional.

## 2018-06-22 NOTE — Progress Notes (Signed)
See patient who reportedly has been having slower rates in the teens and 35s.  Patient still remains relatively asymptomatic but with slowing heart rate we replaced the external pacers and a decision was made to finally put in a temporary until the permanent is done on Monday.  Patient and family are aware.

## 2018-06-23 LAB — BASIC METABOLIC PANEL
Anion gap: 8 (ref 5–15)
BUN: 73 mg/dL — ABNORMAL HIGH (ref 8–23)
CHLORIDE: 102 mmol/L (ref 98–111)
CO2: 24 mmol/L (ref 22–32)
Calcium: 8.4 mg/dL — ABNORMAL LOW (ref 8.9–10.3)
Creatinine, Ser: 2.66 mg/dL — ABNORMAL HIGH (ref 0.61–1.24)
GFR calc Af Amer: 26 mL/min — ABNORMAL LOW (ref >60)
GFR calc non Af Amer: 22 mL/min — ABNORMAL LOW (ref >60)
Glucose, Bld: 85 mg/dL (ref 70–99)
Potassium: 4.7 mmol/L (ref 3.5–5.1)
Sodium: 134 mmol/L — ABNORMAL LOW (ref 135–145)

## 2018-06-23 LAB — CBC
HCT: 36.1 % — ABNORMAL LOW (ref 39.0–52.0)
Hemoglobin: 11.5 g/dL — ABNORMAL LOW (ref 13.0–17.0)
MCH: 31.5 pg (ref 26.0–34.0)
MCHC: 31.9 g/dL (ref 30.0–36.0)
MCV: 98.9 fL (ref 80.0–100.0)
Platelets: 102 10*3/uL — ABNORMAL LOW (ref 150–400)
RBC: 3.65 MIL/uL — AB (ref 4.22–5.81)
RDW: 15.2 % (ref 11.5–15.5)
WBC: 6.7 10*3/uL (ref 4.0–10.5)
nRBC: 0 % (ref 0.0–0.2)

## 2018-06-23 LAB — PROTIME-INR
INR: 2.58
Prothrombin Time: 27.3 seconds — ABNORMAL HIGH (ref 11.4–15.2)

## 2018-06-23 MED ORDER — MUPIROCIN 2 % EX OINT
1.0000 "application " | TOPICAL_OINTMENT | Freq: Two times a day (BID) | CUTANEOUS | Status: DC
  Administered 2018-06-23 – 2018-06-26 (×7): 1 via NASAL
  Filled 2018-06-23: qty 22

## 2018-06-23 MED ORDER — CHLORHEXIDINE GLUCONATE CLOTH 2 % EX PADS
6.0000 | MEDICATED_PAD | Freq: Every day | CUTANEOUS | Status: DC
  Administered 2018-06-24 – 2018-06-26 (×2): 6 via TOPICAL

## 2018-06-23 NOTE — Progress Notes (Signed)
Patient had temporary pacemaker placed yesterday and therefore remains in SDU.  He looks great in no distress.  No new complaints.  Vitals:   06/23/18 0700 06/23/18 0800 06/23/18 0900 06/23/18 1000  BP: (!) 104/59 (!) 112/59 108/61 (!) 112/56  Pulse: (!) 59 (!) 59 (!) 59 (!) 59  Resp: 15 18 16 20   Temp:  98 F (36.7 C)    TempSrc:  Oral    SpO2: 91% 93% 96% 100%  Weight:      [place] 2 LPM   Gen: NAD HEENT: NCAT, sclera white Neck: No JVD Lungs: No wheezes Cardiovascular: Regular (paced), no murmurs Abdomen: Soft, nontender, normal BS Ext: without clubbing, cyanosis, edema Neuro: grossly intact Skin: Limited exam, no lesions noted  BMP Latest Ref Rng & Units 06/23/2018 06/22/2018 06/21/2018  Glucose 70 - 99 mg/dL 85 97 97  BUN 8 - 23 mg/dL 73(H) 72(H) 50(H)  Creatinine 0.61 - 1.24 mg/dL 2.66(H) 3.75(H) 2.80(H)  Sodium 135 - 145 mmol/L 134(L) 136 137  Potassium 3.5 - 5.1 mmol/L 4.7 4.9 5.1  Chloride 98 - 111 mmol/L 102 102 104  CO2 22 - 32 mmol/L 24 22 19(L)  Calcium 8.9 - 10.3 mg/dL 8.4(L) 8.7(L) 9.3   CBC Latest Ref Rng & Units 06/23/2018 06/22/2018 06/21/2018  WBC 4.0 - 10.5 K/uL 6.7 10.6(H) 13.1(H)  Hemoglobin 13.0 - 17.0 g/dL 11.5(L) 12.1(L) 14.1  Hematocrit 39.0 - 52.0 % 36.1(L) 37.5(L) 44.0  Platelets 150 - 400 K/uL 102(L) 129(L) 191   CXR: No new film  IMPRESSION: Chronic atrial fibrillation/flutter Chronic warfarin therapy Status post prosthetic valve placement High-grade AV block AKI - creatinine improving Hyperkalemia, resolved DNR/DNI  PLAN/REC: Monitor in SDU Permanent pacemaker planned for next week Monitor BMET intermittently Monitor I/Os Correct electrolytes as indicated Holding warfarin Holding beta-blocker Nephrology following Cardiology following  Merton Border, MD PCCM service Mobile [phone] Pager [phone] 06/23/2018 11:19 AM

## 2018-06-23 NOTE — Progress Notes (Signed)
[place] at [place] NAME: Kyle Gregory    MR#:  [id number]  DATE OF BIRTH:  07/19/1940  SUBJECTIVE:  CHIEF COMPLAINT:   Chief Complaint  Patient presents with  . Atrial Flutter  . Shortness of Breath   The patient has no complaints.  S/p temporary pacemaker placement, bradycardia improved. REVIEW OF SYSTEMS:   Review of Systems  Constitutional: Negative for chills, fever and malaise/fatigue.  HENT: Negative for sore throat.   Eyes: Negative for blurred vision and double vision.  Respiratory: Negative for cough, hemoptysis, shortness of breath, wheezing and stridor.   Cardiovascular: Negative for chest pain, palpitations, orthopnea and leg swelling.  Gastrointestinal: Negative for abdominal pain, blood in stool, diarrhea, melena, nausea and vomiting.  Genitourinary: Negative for dysuria, flank pain and hematuria.  Musculoskeletal: Negative for back pain and joint pain.  Skin: Negative for rash.  Neurological: Negative for dizziness, sensory change, focal weakness, seizures, loss of consciousness, weakness and headaches.  Endo/Heme/Allergies: Negative for polydipsia.  Psychiatric/Behavioral: Negative for depression. The patient is not nervous/anxious.     DRUG ALLERGIES:   Allergies  Allergen Reactions  . Amiodarone     Other reaction(s): Dizziness  . Donepezil Palpitations    Severe bradycardia    VITALS:  Blood pressure (!) 112/56, pulse (!) 59, temperature 98 F (36.7 C), temperature source Oral, resp. rate 20, weight 85.7 kg, SpO2 100 %.  PHYSICAL EXAMINATION:  GENERAL:  78 y.o.-year-old patient lying in the bed with no acute distress.   Physical Exam Constitutional:      General: He is not in acute distress.    Appearance: Normal appearance.  HENT:     Head: Normocephalic.     Mouth/Throat:     Mouth: Mucous membranes are moist.  Eyes:     General: No scleral icterus.    Conjunctiva/sclera: Conjunctivae normal.      Pupils: Pupils are equal, round, and reactive to light.  Neck:     Musculoskeletal: Normal range of motion and neck supple.     Vascular: No JVD.     Trachea: No tracheal deviation.  Cardiovascular:     Rate and Rhythm: Normal rate.     Heart sounds: Normal heart sounds. No murmur. No gallop.   Pulmonary:     Effort: Pulmonary effort is normal. No respiratory distress.     Breath sounds: Normal breath sounds. No wheezing or rales.  Abdominal:     General: Bowel sounds are normal. There is no distension.     Palpations: Abdomen is soft.     Tenderness: There is no abdominal tenderness. There is no rebound.  Musculoskeletal: Normal range of motion.        General: No tenderness.     Right lower leg: No edema.     Left lower leg: No edema.  Skin:    Findings: No erythema or rash.  Neurological:     General: No focal deficit present.     Mental Status: He is alert and oriented to person, place, and time.     Cranial Nerves: No cranial nerve deficit.  Psychiatric:        Mood and Affect: Mood normal.    LABORATORY PANEL:   CBC Recent Labs  Lab 06/23/18 0720  WBC 6.7  HGB 11.5*  HCT 36.1*  PLT 102*   ------------------------------------------------------------------------------------------------------------------  Chemistries  Recent Labs  Lab 06/23/18 0720  NA 134*  K 4.7  CL 102  CO2  24  GLUCOSE 85  BUN 73*  CREATININE 2.66*  CALCIUM 8.4*   ------------------------------------------------------------------------------------------------------------------  Cardiac Enzymes Recent Labs  Lab 06/20/18 1942 06/21/18 0200  TROPONINI 0.09* 0.09*   ------------------------------------------------------------------------------------------------------------------  RADIOLOGY:  No results found.  ASSESSMENT AND PLAN:   Active Problems:   Bradycardia   78 year old male with history of chronic atrial fibrillation and aortic valve replacement on Coumadin who  presents from home with shortness of breath and found to have bradycardia.  1.  Symptomatic bradycardia: Intensivist consult and [facility] cardiology consultation appreciated Continue telemetry Hold beta-blocker and continue  transcutaneous pacemaker PRN atropine Echo is pending. Per cardiology plan is to put a permanent pacemaker once his INR is normal.  Holding Coumadin for now. Status post temporary pacemaker placement. Per Dr. Clayborn Bigness, [facility] tomorrow.   2.  Elevated troponin: Likely due to demand ischemia from symptomatic bradycardia Follow-up echo.  3.   hyperkalemia: One-time dose of lokelma Improved.  4.  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  History of chronic atrial fibrillation: Patient is now in a flutter with slow heart rate Holding metoprolol Coumadin holding due to need of permanent pacemaker placement.  6.  Heart valve replacement: Holding Coumadin for now.  7.  CAD: Continue pravastatin  8.  ARF on CKD stage III Nephrology consult appreciated, improving.  9.  Altered mental status-metabolic encephalopathy due to infection and bradycardia. Mental status improved.  10.  UTI Rocephin is discontinued.  Urine cultures: no growth.no growth..   Discussed with Dr. Alva Garnet. All the records are reviewed and case discussed with Care Management/Social Workerr. Management plans discussed with the patient, his wife and they are in agreement.  CODE STATUS: DNR  TOTAL TIME TAKING CARE OF THIS PATIENT: 28 minutes.   Talk to his sister in the room.  POSSIBLE D/C IN 3 DAYS, DEPENDING ON CLINICAL CONDITION.   [name] M.D on 06/23/2018   Between 7am to 6pm - Pager - [phone]  After 6pm go to [email or web address] - password [email or web address] [facility]  Office  [phone]  CC: Primary care physician; Rusty Aus,  MD  Note: This dictation was prepared with Dragon dictation along with smaller phrase technology. Any transcriptional errors that result from this process are unintentional.

## 2018-06-23 NOTE — Progress Notes (Signed)
[facility], [place] 06/23/18  Subjective:   Patient is more interactive today.  Temporary internal pacemaker was placed yesterday.  It appears to have resulted in improved cardiac output and subsequently improved urine output of 650 cc.  Serum creatinine is lower at 2.66  Objective:  Vital signs in last 24 hours:  Temp:  [97.6 F (36.4 C)-98.3 F (36.8 C)] 97.6 F (36.4 C) (02/23 1400) Pulse Rate:  [58-61] 58 (02/23 1500) Resp:  [14-24] 22 (02/23 1500) BP: (93-125)/(56-65) 111/62 (02/23 1500) SpO2:  [83 %-100 %] 94 % (02/23 1500)  Weight change:  Filed Weights   06/20/18 1144  Weight: 85.7 kg    Intake/Output:    Intake/Output Summary (Last 24 hours) at 06/23/2018 1651 Last data filed at 06/23/2018 1600 Gross per 24 hour  Intake 1625.02 ml  Output 925 ml  Net 700.02 ml     Physical Exam: General:  Frail, elderly gentleman, laying in the bed  HEENT  moist oral mucous membranes, decreased hearing  Neck:  No masses or JVD  Lungs:  Coarse breath sounds bilaterally, O2 [place]  Heart::  Irregular rhythm, atrial flutter, temporary pacemaker-paced rhythm  Abdomen:  Soft, nontender  Extremities:  Trace edema  Neurologic:  Alert, able to follow commands  Skin:  Scattered ecchymosis  Foley:  Catheter in place     Basic Metabolic Panel:  Recent Labs  Lab 06/20/18 1154 06/21/18 0200 06/21/18 0559 06/22/18 0424 06/23/18 0720  NA 133* 136 137 136 134*  K 5.3* 6.8* 5.1 4.9 4.7  CL 103 101 104 102 102  CO2 19* 17* 19* 22 24  GLUCOSE 123* 109* 97 97 85  BUN 35* 47* 50* 72* 73*  CREATININE 1.66* 2.78* 2.80* 3.75* 2.66*  CALCIUM 9.0 9.5 9.3 8.7* 8.4*     CBC: Recent Labs  Lab 06/20/18 1154 06/21/18 0200 06/22/18 0424 06/23/18 0720  WBC 8.6 13.1* 10.6* 6.7  HGB 13.6 14.1 12.1* 11.5*  HCT 42.5 44.0 37.5* 36.1*  MCV 99.1 97.1 99.2 98.9  PLT 149* 191 129* 102*     No results found for: HEPBSAG, HEPBSAB,  HEPBIGM    Microbiology:  Recent Results (from the past 240 hour(s))  MRSA PCR Screening     Status: Abnormal   Collection Time: 06/20/18  4:52 PM  Result Value Ref Range Status   MRSA by PCR POSITIVE (A) NEGATIVE Final    Comment:        The GeneXpert MRSA Assay (FDA approved for NASAL specimens only), is one component of a comprehensive MRSA colonization surveillance program. It is not intended to diagnose MRSA infection nor to guide or monitor treatment for MRSA infections. CRITICAL RESULT CALLED TO, READ BACK BY AND VERIFIED WITH: CALLED TO BRANDY MANSFIELD @1900  06/20/2018 [facility] Performed at [facility], [address], [place], [place] [address]   Urine Culture     Status: None   Collection Time: 06/20/18  6:26 PM  Result Value Ref Range Status   Specimen Description   Final    URINE, CATHETERIZED Performed at [facility], [address] [place], [place] [address]    Special Requests   Final    Normal Performed at [facility], [address] [place], [place] [address]    Culture   Final    NO GROWTH Performed at [facility], [address] [place], [place] [address]    Report Status 06/21/2018 FINAL  Final    Coagulation Studies:  Recent Labs    06/21/18 0200 06/22/18 0424 06/23/18 0422  LABPROT 27.1* 28.8* 27.3*  INR 2.55 2.76 2.58    Urinalysis: Recent Labs    06/20/18 1826  COLORURINE AMBER*  LABSPEC 1.018  PHURINE 5.0  GLUCOSEU NEGATIVE  HGBUR MODERATE*  BILIRUBINUR NEGATIVE  KETONESUR NEGATIVE  PROTEINUR NEGATIVE  NITRITE NEGATIVE  LEUKOCYTESUR TRACE*      Imaging: No results found.   Medications:    . calcium-vitamin D  1 tablet Oral Daily  . Chlorhexidine Gluconate Cloth  6 each Topical Q0600  . ferrous sulfate  325 mg Oral BID WC  . FLUoxetine  20 mg Oral Daily  . mouth rinse  15 mL Mouth Rinse BID  . Melatonin  1 tablet Oral QHS  . mupirocin ointment  1 application Nasal BID  .  pantoprazole  40 mg Oral Daily  . pravastatin  40 mg Oral QHS   acetaminophen **OR** [DISCONTINUED] acetaminophen, bisacodyl, HYDROcodone-acetaminophen, nitroGLYCERIN, [DISCONTINUED] ondansetron **OR** ondansetron (ZOFRAN) IV, polyethylene glycol  Assessment/ Plan:  78 y.o. 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   1.  Acute kidney injury Baseline creatinine 1.16 in December 2019; AKI appears to be secondary to ATN from hemodynamic instability, hypotension Oliguria has resolved and serum creatinine and urine output appears to be improving after pacemaker was placed. Agree with low-dose maintenance IV fluids No acute indication for dialysis at present   2.  Severe hyperkalemia Patient was on Spironolactone at home.  Agree with discontinuing Treated with medical shifting measures.    Potassium level has now improved to 4.7 Agree with maintenance [place]  3.  Cardiac history- atrial fibrillation, flutter, h/o bradycardia, history of dilated cardiomyopathy, CHF, aortic valve replacement, three-vessel CABG May 2018 at [facility], peripheral arterial disease, post CABG stroke.  Last outpatient echo from August 2019 shows severe LV systolic dysfunction with mild LVH, moderate RV systolic dysfunction, mild valvular regurgitation, EF 20 to 25%.  Now admitted for severe bradycardia    LOS: [place] 2/23/20204:51 PM  [place], [place]  Note: This note was prepared with Dragon dictation. Any transcription errors are unintentional

## 2018-06-24 ENCOUNTER — Inpatient Hospital Stay: Payer: Medicare Other | Admitting: Anesthesiology

## 2018-06-24 ENCOUNTER — Inpatient Hospital Stay: Payer: Medicare Other

## 2018-06-24 ENCOUNTER — Encounter: Payer: Self-pay | Admitting: Anesthesiology

## 2018-06-24 ENCOUNTER — Encounter
Admission: EM | Disposition: A | Discharge status: Home / Self Care | Payer: Self-pay | Source: Home / Self Care | Attending: Internal Medicine

## 2018-06-24 HISTORY — PX: PACEMAKER INSERTION: SHX728

## 2018-06-24 LAB — PROTIME-INR
INR: 2.2
Prothrombin Time: 24.1 seconds — ABNORMAL HIGH (ref 11.4–15.2)

## 2018-06-24 LAB — BASIC METABOLIC PANEL
Anion gap: 10 (ref 5–15)
BUN: 60 mg/dL — ABNORMAL HIGH (ref 8–23)
CO2: 22 mmol/L (ref 22–32)
Calcium: 8.5 mg/dL — ABNORMAL LOW (ref 8.9–10.3)
Chloride: 105 mmol/L (ref 98–111)
Creatinine, Ser: 1.74 mg/dL — ABNORMAL HIGH (ref 0.61–1.24)
GFR calc Af Amer: 43 mL/min — ABNORMAL LOW (ref >60)
GFR calc non Af Amer: 37 mL/min — ABNORMAL LOW (ref >60)
Glucose, Bld: 87 mg/dL (ref 70–99)
Potassium: 4.3 mmol/L (ref 3.5–5.1)
Sodium: 137 mmol/L (ref 135–145)

## 2018-06-24 LAB — CBC
HCT: 37.7 % — ABNORMAL LOW (ref 39.0–52.0)
Hemoglobin: 11.8 g/dL — ABNORMAL LOW (ref 13.0–17.0)
MCH: 31.3 pg (ref 26.0–34.0)
MCHC: 31.3 g/dL (ref 30.0–36.0)
MCV: 100 fL (ref 80.0–100.0)
PLATELETS: 114 10*3/uL — AB (ref 150–400)
RBC: 3.77 MIL/uL — ABNORMAL LOW (ref 4.22–5.81)
RDW: 15.1 % (ref 11.5–15.5)
WBC: 6 10*3/uL (ref 4.0–10.5)
nRBC: 0 % (ref 0.0–0.2)

## 2018-06-24 SURGERY — INSERTION, CARDIAC PACEMAKER
Anesthesia: Monitor Anesthesia Care

## 2018-06-24 MED ORDER — SODIUM CHLORIDE 0.9 % IV SOLN
80.0000 mg | INTRAVENOUS | Status: DC
  Administered 2018-06-24: 80 mg
  Filled 2018-06-24: qty 80

## 2018-06-24 MED ORDER — ONDANSETRON HCL 4 MG/2ML IJ SOLN: 4.0000 mg | Freq: Once | INTRAMUSCULAR | Status: DC | PRN

## 2018-06-24 MED ORDER — ACETAMINOPHEN 325 MG PO TABS: 325.0000 mg | ORAL_TABLET | ORAL | Status: DC | PRN

## 2018-06-24 MED ORDER — LIDOCAINE HCL (PF) 2 % IJ SOLN
INTRAMUSCULAR | Status: AC
  Filled 2018-06-24: qty 10

## 2018-06-24 MED ORDER — CEFAZOLIN SODIUM 1 G IJ SOLR
INTRAMUSCULAR | Status: AC
  Filled 2018-06-24: qty 20

## 2018-06-24 MED ORDER — FENTANYL CITRATE (PF) 100 MCG/2ML IJ SOLN: 25.0000 ug | INTRAMUSCULAR | Status: DC | PRN

## 2018-06-24 MED ORDER — FENTANYL CITRATE (PF) 100 MCG/2ML IJ SOLN
INTRAMUSCULAR | Status: DC | PRN
  Administered 2018-06-24: 25 ug via INTRAVENOUS

## 2018-06-24 MED ORDER — MIDAZOLAM HCL 2 MG/2ML IJ SOLN
INTRAMUSCULAR | Status: AC
  Filled 2018-06-24: qty 2

## 2018-06-24 MED ORDER — PHENYLEPHRINE HCL 10 MG/ML IJ SOLN
INTRAMUSCULAR | Status: DC | PRN
  Administered 2018-06-24: 50 ug via INTRAVENOUS

## 2018-06-24 MED ORDER — MIDAZOLAM HCL 2 MG/2ML IJ SOLN
INTRAMUSCULAR | Status: DC | PRN
  Administered 2018-06-24: 1 mg via INTRAVENOUS

## 2018-06-24 MED ORDER — ONDANSETRON HCL 4 MG/2ML IJ SOLN
INTRAMUSCULAR | Status: DC | PRN
  Administered 2018-06-24: 4 mg via INTRAVENOUS

## 2018-06-24 MED ORDER — FENTANYL CITRATE (PF) 100 MCG/2ML IJ SOLN
INTRAMUSCULAR | Status: AC
  Filled 2018-06-24: qty 2

## 2018-06-24 MED ORDER — CEFAZOLIN SODIUM-DEXTROSE 2-4 GM/100ML-% IV SOLN
2.0000 g | INTRAVENOUS | Status: AC
  Administered 2018-06-24: 2 g via INTRAVENOUS
  Filled 2018-06-24 (×2): qty 100

## 2018-06-24 MED ORDER — LIDOCAINE 1 % OPTIME INJ - NO CHARGE
INTRAMUSCULAR | Status: DC | PRN
  Administered 2018-06-24: 30 mL

## 2018-06-24 MED ORDER — ONDANSETRON HCL 4 MG/2ML IJ SOLN: 4.0000 mg | Freq: Four times a day (QID) | INTRAMUSCULAR | Status: DC | PRN

## 2018-06-24 MED ORDER — PROPOFOL 500 MG/50ML IV EMUL
INTRAVENOUS | Status: AC
  Filled 2018-06-24: qty 50

## 2018-06-24 MED ORDER — CEFAZOLIN SODIUM-DEXTROSE 1-4 GM/50ML-% IV SOLN
1.0000 g | Freq: Four times a day (QID) | INTRAVENOUS | Status: AC
  Administered 2018-06-24 – 2018-06-25 (×3): 1 g via INTRAVENOUS
  Filled 2018-06-24 (×3): qty 50

## 2018-06-24 MED ORDER — PROPOFOL 500 MG/50ML IV EMUL
INTRAVENOUS | Status: DC | PRN
  Administered 2018-06-24: 20 ug/kg/min via INTRAVENOUS

## 2018-06-24 MED ORDER — SODIUM CHLORIDE 0.9 % IV SOLN
INTRAVENOUS | Status: DC | PRN
  Administered 2018-06-24: 500 mL

## 2018-06-24 MED ORDER — SODIUM CHLORIDE 0.9 % IV SOLN
INTRAVENOUS | Status: DC
  Administered 2018-06-24: 13:00:00 via INTRAVENOUS

## 2018-06-24 SURGICAL SUPPLY — 38 items
BAG DECANTER FOR FLEXI CONT (MISCELLANEOUS) ×3 IMPLANT
BRUSH SCRUB EZ  4% CHG (MISCELLANEOUS) ×2
BRUSH SCRUB EZ 4% CHG (MISCELLANEOUS) ×1 IMPLANT
CABLE SURG 12 DISP A/V CHANNEL (MISCELLANEOUS) ×3 IMPLANT
CANISTER SUCT 1200ML W/VALVE (MISCELLANEOUS) ×3 IMPLANT
CHLORAPREP W/TINT 26ML (MISCELLANEOUS) ×3 IMPLANT
COVER LIGHT HANDLE STERIS (MISCELLANEOUS) ×6 IMPLANT
COVER MAYO STAND STRL (DRAPES) ×3 IMPLANT
COVER WAND RF STERILE (DRAPES) ×1 IMPLANT
DRAPE C-ARM XRAY 36X54 (DRAPES) ×3 IMPLANT
DRSG TEGADERM 4X4.75 (GAUZE/BANDAGES/DRESSINGS) ×3 IMPLANT
DRSG TELFA 4X3 1S NADH ST (GAUZE/BANDAGES/DRESSINGS) ×3 IMPLANT
ELECT REM PT RETURN 9FT ADLT (ELECTROSURGICAL) ×3
ELECTRODE REM PT RTRN 9FT ADLT (ELECTROSURGICAL) ×1 IMPLANT
GLOVE BIO SURGEON STRL SZ7.5 (GLOVE) ×3 IMPLANT
GLOVE BIO SURGEON STRL SZ8 (GLOVE) ×3 IMPLANT
GOWN STRL REUS W/ TWL LRG LVL3 (GOWN DISPOSABLE) ×1 IMPLANT
GOWN STRL REUS W/ TWL XL LVL3 (GOWN DISPOSABLE) ×1 IMPLANT
GOWN STRL REUS W/TWL LRG LVL3 (GOWN DISPOSABLE) ×2
GOWN STRL REUS W/TWL XL LVL3 (GOWN DISPOSABLE) ×2
IMMOBILIZER SHDR LG LX 900803 (SOFTGOODS) ×2 IMPLANT
IMMOBILIZER SHDR MD LX WHT (SOFTGOODS) IMPLANT
IMMOBILIZER SHDR XL LX WHT (SOFTGOODS) IMPLANT
INTRO PACEMAKR LEAD 9FR 13CM (INTRODUCER) ×3
INTRO PACEMKR SHEATH II 7FR (MISCELLANEOUS) ×3
INTRODUCER PACEMKR LD 9FR 13CM (INTRODUCER) IMPLANT
INTRODUCER PACEMKR SHTH II 7FR (MISCELLANEOUS) ×1 IMPLANT
IPG PACE AZUR XT SR MRI W1SR01 (Pacemaker) IMPLANT
IV NS 500ML (IV SOLUTION) ×2
IV NS 500ML BAXH (IV SOLUTION) ×1 IMPLANT
KIT TURNOVER KIT A (KITS) ×3 IMPLANT
LABEL OR SOLS (LABEL) ×1 IMPLANT
LEAD CAPSURE NOVUS 5076-58CM (Lead) ×2 IMPLANT
MARKER SKIN DUAL TIP RULER LAB (MISCELLANEOUS) ×3 IMPLANT
PACE AZURE XT SR MRI W1SR01 (Pacemaker) ×3 IMPLANT
PACK PACE INSERTION (MISCELLANEOUS) ×3 IMPLANT
PAD ONESTEP ZOLL R SERIES ADT (MISCELLANEOUS) ×3 IMPLANT
SUT SILK 0 SH 30 (SUTURE) ×9 IMPLANT

## 2018-06-24 NOTE — Anesthesia Post-op Follow-up Note (Signed)
Anesthesia QCDR form completed.        

## 2018-06-24 NOTE — Progress Notes (Signed)
[facility], [place] 06/24/18  Subjective:  Renal function appears improved. Creatinine down to 1.74. Good urine output noted.   Objective:  Vital signs in last 24 hours:  Temp:  [97.6 F (36.4 C)-98.1 F (36.7 C)] 97.6 F (36.4 C) (02/24 0400) Pulse Rate:  [57-62] 59 (02/24 0500) Resp:  [14-26] 16 (02/24 0500) BP: (101-117)/(55-81) 110/64 (02/24 0500) SpO2:  [88 %-100 %] 93 % (02/24 0500)  Weight change:  Filed Weights   06/20/18 1144  Weight: 85.7 kg    Intake/Output:    Intake/Output Summary (Last 24 hours) at 06/24/2018 1001 Last data filed at 06/24/2018 0600 Gross per 24 hour  Intake 419.97 ml  Output 1275 ml  Net -855.03 ml     Physical Exam: General:  Frail, elderly gentleman, laying in the bed  HEENT  moist oral mucous membranes, decreased hearing  Neck:  supple  Lungs:  Coarse breath sounds bilaterally, O2 [place]  Heart::  Irregular rhythm  Abdomen:  Soft, nontender, BS present  Extremities:  Trace edema  Neurologic:  Alert, able to follow commands  Skin:  Scattered ecchymosis  Foley:  Catheter in place     Basic Metabolic Panel:  Recent Labs  Lab 06/21/18 0200 06/21/18 0559 06/22/18 0424 06/23/18 0720 06/24/18 0613  NA 136 137 136 134* 137  K 6.8* 5.1 4.9 4.7 4.3  CL 101 104 102 102 105  CO2 17* 19* 22 24 22   GLUCOSE 109* 97 97 85 87  BUN 47* 50* 72* 73* 60*  CREATININE 2.78* 2.80* 3.75* 2.66* 1.74*  CALCIUM 9.5 9.3 8.7* 8.4* 8.5*     CBC: Recent Labs  Lab 06/20/18 1154 06/21/18 0200 06/22/18 0424 06/23/18 0720 06/24/18 0613  WBC 8.6 13.1* 10.6* 6.7 6.0  HGB 13.6 14.1 12.1* 11.5* 11.8*  HCT 42.5 44.0 37.5* 36.1* 37.7*  MCV 99.1 97.1 99.2 98.9 100.0  PLT 149* 191 129* 102* 114*     No results found for: HEPBSAG, HEPBSAB, HEPBIGM    Microbiology:  Recent Results (from the past 240 hour(s))  MRSA PCR Screening     Status: Abnormal   Collection Time: 06/20/18  4:52 PM  Result Value Ref Range  Status   MRSA by PCR POSITIVE (A) NEGATIVE Final    Comment:        The GeneXpert MRSA Assay (FDA approved for NASAL specimens only), is one component of a comprehensive MRSA colonization surveillance program. It is not intended to diagnose MRSA infection nor to guide or monitor treatment for MRSA infections. CRITICAL RESULT CALLED TO, READ BACK BY AND VERIFIED WITH: CALLED TO BRANDY MANSFIELD @1900  06/20/2018 [facility] Performed at [facility], [address] [place], [place] [address]   Urine Culture     Status: None   Collection Time: 06/20/18  6:26 PM  Result Value Ref Range Status   Specimen Description   Final    URINE, CATHETERIZED Performed at [facility], [address] [place], [place] [address]    Special Requests   Final    Normal Performed at [facility], [address] [place], [place] [address]    Culture   Final    NO GROWTH Performed at [facility], [place] [address], [place], [place] [address]    Report Status 06/21/2018 FINAL  Final    Coagulation Studies: Recent Labs    06/22/18 0424 06/23/18 0422 06/24/18 0613  LABPROT 28.8* 27.3* 24.1*  INR 2.76 2.58 2.20  Urinalysis: No results for input(s): COLORURINE, LABSPEC, PHURINE, GLUCOSEU, HGBUR, BILIRUBINUR, KETONESUR, PROTEINUR, UROBILINOGEN, NITRITE, LEUKOCYTESUR in the last 72 hours.  Invalid input(s): APPERANCEUR    Imaging: No results found.   Medications:    . calcium-vitamin D  1 tablet Oral Daily  . Chlorhexidine Gluconate Cloth  6 each Topical Q0600  . ferrous sulfate  325 mg Oral BID WC  . FLUoxetine  20 mg Oral Daily  . mouth rinse  15 mL Mouth Rinse BID  . Melatonin  1 tablet Oral QHS  . mupirocin ointment  1 application Nasal BID  . pantoprazole  40 mg Oral Daily  . pravastatin  40 mg Oral QHS   acetaminophen **OR** [DISCONTINUED] acetaminophen, bisacodyl, HYDROcodone-acetaminophen, nitroGLYCERIN, [DISCONTINUED] ondansetron **OR**  ondansetron (ZOFRAN) IV, polyethylene glycol  Assessment/ Plan:  78 y.o. 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   1.  Acute kidney injury Baseline creatinine 1.16 in December 2019; AKI appears to be secondary to ATN from hemodynamic instability, hypotension -Renal function has improved.  Creatinine down to 1.74 with a BUN of 60.  Good urine output noted.  No acute indication for dialysis.  2.  Severe hyperkalemia Patient was on Spironolactone at home.  Agree with discontinuing -Potassium currently acceptable at 4.3.  Continue to monitor.  3.  Cardiac history- atrial fibrillation, flutter, h/o bradycardia, history of dilated cardiomyopathy, CHF, aortic valve replacement, three-vessel CABG May 2018 at [facility], peripheral arterial disease, post CABG stroke.  Last outpatient echo from August 2019 shows severe LV systolic dysfunction with mild LVH, moderate RV systolic dysfunction, mild valvular regurgitation, EF 20 to 25%.  Now admitted for severe bradycardia    LOS: 4 [place] 2/24/202010:01 AM  [place], [place]  Note: This note was prepared with Dragon dictation. Any transcription errors are unintentional

## 2018-06-24 NOTE — Op Note (Signed)
[facility]   06/24/2018                     3:06 PM  PATIENT:  Kyle Gregory    PRE-OPERATIVE DIAGNOSIS:  Bradycardia  POST-OPERATIVE DIAGNOSIS:  Same  PROCEDURE:  INSERTION PACEMAKER  SURGEON:  Isaias Cowman, MD    ANESTHESIA:     PREOPERATIVE INDICATIONS:  Kyle Gregory is a  78 y.o. male with a diagnosis of Bradycardia who failed conservative measures and elected for surgical management.    The risks benefits and alternatives were discussed with the patient preoperatively including but not limited to the risks of infection, bleeding, cardiopulmonary complications, the need for revision surgery, among others, and the patient was willing to proceed.   OPERATIVE PROCEDURE: The patient was brought to the operating room in a fasting state.  The left pectoral region was prepped and draped in usual sterile manner.  Anesthesia was obtained 1% lidocaine locally.  A 6 cm incision was performed to the left pectoral region.  Pacemaker pocket was generated electrocautery and blunt dissection.  Access was obtained to the left subclavian vein by fine-needle aspiration.  An MRI compatible lead was positioned to the right ventricular apical septum after fluoroscopic guidance.  After proper thresholds were obtained the lead was sutured in place with 0 silk.  The lead was connected to an MRI compatible single-chamber rate responsive pacemaker generator ( Medtronic [id number] ).  The pacemaker pocket was irrigated with gentamicin solution.  The pacemaker generator was positioned into the pocket and the pocket was closed with 2-0 and 4-0 Vicryl, respectively.  Steri-Strips and pressure dressing were applied.  There were no periprocedural complications.  Postprocedural interrogation revealed appropriate single-chamber sensing and pacing thresholds.

## 2018-06-24 NOTE — Anesthesia Preprocedure Evaluation (Signed)
Anesthesia Evaluation  Patient identified by MRN, date of birth, ID band Patient awake    Reviewed: Allergy & Precautions, NPO status , Patient's Chart, lab work & pertinent test results, reviewed documented beta blocker date and time   Airway Mallampati: II  TM Distance: >3 FB     Dental  (+) Chipped   Pulmonary COPD,           Cardiovascular + CABG and +CHF  + dysrhythmias Atrial Fibrillation      Neuro/Psych PSYCHIATRIC DISORDERS Dementia CVA    GI/Hepatic   Endo/Other    Renal/GU      Musculoskeletal   Abdominal   Peds  Hematology  (+) anemia ,   Anesthesia Other Findings INR increased. R and L bbb. AV valve replace. Inc troponins. EF 20-25.  Reproductive/Obstetrics                             Anesthesia Physical Anesthesia Plan  ASA: III  Anesthesia Plan: MAC   Post-op Pain Management:    Induction:   PONV Risk Score and Plan:   Airway Management Planned:   Additional Equipment:   Intra-op Plan:   Post-operative Plan:   Informed Consent: I have reviewed the patients History and Physical, chart, labs and discussed the procedure including the risks, benefits and alternatives for the proposed anesthesia with the patient or authorized representative who has indicated his/her understanding and acceptance.       Plan Discussed with: CRNA  Anesthesia Plan Comments:         Anesthesia Quick Evaluation

## 2018-06-24 NOTE — Progress Notes (Signed)
Patient in atrial fibrillation with marked bradycardia with temporary transvenous pacemaker, requires permanent pacemaker.

## 2018-06-24 NOTE — Anesthesia Postprocedure Evaluation (Signed)
Anesthesia Post Note  Patient: Kyle Gregory  Procedure(s) Performed: INSERTION PACEMAKER (N/A )  Patient location during evaluation: PACU Anesthesia Type: MAC Level of consciousness: awake and alert Pain management: pain level controlled Vital Signs Assessment: post-procedure vital signs reviewed and stable Respiratory status: spontaneous breathing, nonlabored ventilation, respiratory function stable and patient connected to nasal cannula oxygen Cardiovascular status: stable and blood pressure returned to baseline Postop Assessment: no apparent nausea or vomiting Anesthetic complications: no     Last Vitals:  Vitals:   06/24/18 1700 06/24/18 1800  BP: 113/69 131/84  Pulse: 69 69  Resp: 16 (!) 22  Temp: (!) 36.2 C   SpO2: 100% (!) 87%    Last Pain:  Vitals:   06/24/18 1700  TempSrc: Oral  PainSc:                  [name] S

## 2018-06-24 NOTE — Progress Notes (Signed)
   CHIEF COMPLAINT:   Severe bradycardia  Subjective  S/p temp pacer placed Plan for permament pacemaker placement today No distress No pain      Objective   VITALS: BP 110/64   Pulse (!) 59   Temp 97.6 F (36.4 C) (Axillary)   Resp 16   Wt 85.7 kg   SpO2 93%   BMI 25.62 kg/m    Review of Systems:  Gen:  Denies  fever, sweats, chills weigh loss  HEENT: Denies blurred vision, double vision, ear pain, eye pain, hearing loss, nose bleeds, sore throat Cardiac:  No dizziness, chest pain or heaviness, chest tightness,edema, No JVD Resp:   -wheezing, -hemoptysis,  Gi: Denies swallowing difficulty, stomach pain, nausea or vomiting, diarrhea, constipation, bowel incontinence Gu:  Denies bladder incontinence, burning urine Ext:   Denies Joint pain, stiffness or swelling Skin: Denies  skin rash, easy bruising or bleeding or hives Endoc:  Denies polyuria, polydipsia , polyphagia or weight change Psych:   Denies depression, insomnia or hallucinations  Other:  All other systems negative  Physical Examination:   GENERAL:NAD, no fevers, chills, no weakness no fatigue HEAD: Normocephalic, atraumatic.  EYES: Pupils equal, round, reactive to light. Extraocular muscles intact. No scleral icterus.  MOUTH: Moist mucosal membrane. Dentition intact. No abscess noted.  EAR, NOSE, THROAT: Clear without exudates. No external lesions.  NECK: Supple. No thyromegaly. No nodules. No JVD.  PULMONARY: CTA B/L no wheezing, rhonchi, crackles CARDIOVASCULAR: S1 and S2. Regular rate and rhythm. No murmurs, rubs, or gallops. No edema. Pedal pulses 2+ bilaterally.  GASTROINTESTINAL: Soft, nontender, nondistended. No masses. Positive bowel sounds. No hepatosplenomegaly.  MUSCULOSKELETAL: No swelling, clubbing, or edema. Range of motion full in all extremities.  NEUROLOGIC: Cranial nerves II through XII are intact. No gross focal neurological deficits. Sensation intact. Reflexes intact.  SKIN: No  ulceration, lesions, rashes, or cyanosis. Skin warm and dry. Turgor intact.  PSYCHIATRIC: Mood, affect within normal limits. The patient is awake, alert and oriented x 3. Insight, judgment intact.  ALL OTHER ROS ARE NEGATIVE  CHronic AFib with Flutter with symptomatic bradycardia High grade AV block DNR/DNI  Plan for pacemaker placement Follow up cardiology recs and nephrology recs    Kyle Gregory, M.D.  [facility]  Medical Director [place] Director [facility] Cardio-Pulmonary Department

## 2018-06-24 NOTE — Care Management Note (Signed)
Case Management Note  Patient Details  Name: Kyle Gregory MRN: [id number] Date of Birth: 1941-01-31  Subjective/Objective:  Patient remains in the ICU stepdown status with temporary pacemaker.  Plan is for patient to get permanent pacemaker placed today.  RNCM will cont to follow. Doran Clay RN BSN Care Manager [phone]                   Action/Plan:   Expected Discharge Date:                  Expected Discharge Plan:     In-House Referral:     Discharge planning Services     Post Acute Care Choice:    Choice offered to:     DME Arranged:    DME Agency:     HH Arranged:    HH Agency:     Status of Service:     If discussed at [place] of Stay Meetings, dates discussed:    Additional Comments:  Shelbie Hutching, RN 06/24/2018, 1:33 PM

## 2018-06-24 NOTE — Transfer of Care (Signed)
Immediate Anesthesia Transfer of Care Note  Patient: Kyle Gregory  Procedure(s) Performed: INSERTION PACEMAKER (N/A )  Patient Location: PACU  Anesthesia Type:General  Level of Consciousness: sedated  Airway & Oxygen Therapy: Patient Spontanous Breathing and Patient connected to face mask oxygen  Post-op Assessment: Report given to RN and Post -op Vital signs reviewed and stable  Post vital signs: Reviewed and stable  Last Vitals:  Vitals Value Taken Time  BP 123/68 06/24/2018  3:14 PM  Temp    Pulse 71 06/24/2018  3:14 PM  Resp 15 06/24/2018  3:14 PM  SpO2 98 % 06/24/2018  3:14 PM  Vitals shown include unvalidated device data.  Last Pain:  Vitals:   06/24/18 1514  TempSrc:   PainSc: 0-No pain      Patients Stated Pain Goal: 0 (00/17/49 4496)  Complications: No apparent anesthesia complications

## 2018-06-24 NOTE — Progress Notes (Signed)
[facility] NAME: Kyle Gregory    MR#:  [id number]  DATE OF BIRTH:  [birth year]  SUBJECTIVE:  CHIEF COMPLAINT:   Chief Complaint  Patient presents with  . Atrial Flutter  . Shortness of Breath   The patient has no complaints.  S/p temporary pacemaker placement, HR 70-90's, Afib. REVIEW OF SYSTEMS:   Review of Systems  Constitutional: Negative for chills, fever and malaise/fatigue.  HENT: Negative for sore throat.   Eyes: Negative for blurred vision and double vision.  Respiratory: Negative for cough, hemoptysis, shortness of breath, wheezing and stridor.   Cardiovascular: Negative for chest pain, palpitations, orthopnea and leg swelling.  Gastrointestinal: Negative for abdominal pain, blood in stool, diarrhea, melena, nausea and vomiting.  Genitourinary: Negative for dysuria, flank pain and hematuria.  Musculoskeletal: Negative for back pain and joint pain.  Skin: Negative for rash.  Neurological: Negative for dizziness, sensory change, focal weakness, seizures, loss of consciousness, weakness and headaches.  Endo/Heme/Allergies: Negative for polydipsia.  Psychiatric/Behavioral: Negative for depression. The patient is not nervous/anxious.     DRUG ALLERGIES:   Allergies  Allergen Reactions  . Amiodarone     Other reaction(s): Dizziness  . Donepezil Palpitations    Severe bradycardia    VITALS:  Blood pressure 111/62, pulse (!) 59, temperature 97.7 F (36.5 C), temperature source Oral, resp. rate 15, weight 85.7 kg, SpO2 96 %.  PHYSICAL EXAMINATION:  GENERAL:  78 y.o.-year-old patient lying in the bed with no acute distress.   Physical Exam Constitutional:      General: He is not in acute distress.    Appearance: Normal appearance.  HENT:     Head: Normocephalic.     Mouth/Throat:     Mouth: Mucous membranes are moist.  Eyes:     General: No scleral icterus.    Conjunctiva/sclera: Conjunctivae normal.   Pupils: Pupils are equal, round, and reactive to light.  Neck:     Musculoskeletal: Normal range of motion and neck supple.     Vascular: No JVD.     Trachea: No tracheal deviation.  Cardiovascular:     Rate and Rhythm: Normal rate. Rhythm irregular.     Heart sounds: Normal heart sounds. No murmur. No gallop.   Pulmonary:     Effort: Pulmonary effort is normal. No respiratory distress.     Breath sounds: Normal breath sounds. No wheezing or rales.  Abdominal:     General: Bowel sounds are normal. There is no distension.     Palpations: Abdomen is soft.     Tenderness: There is no abdominal tenderness. There is no rebound.  Musculoskeletal: Normal range of motion.        General: No tenderness.     Right lower leg: No edema.     Left lower leg: No edema.  Skin:    Findings: No erythema or rash.  Neurological:     General: No focal deficit present.     Mental Status: He is alert and oriented to person, place, and time.     Cranial Nerves: No cranial nerve deficit.  Psychiatric:        Mood and Affect: Mood normal.    LABORATORY PANEL:   CBC Recent Labs  Lab 06/24/18 0613  WBC 6.0  HGB 11.8*  HCT 37.7*  PLT 114*   ------------------------------------------------------------------------------------------------------------------  Chemistries  Recent Labs  Lab 06/24/18 0613  NA 137  K 4.3  CL 105  CO2 22  GLUCOSE 87  BUN 60*  CREATININE 1.74*  CALCIUM 8.5*   ------------------------------------------------------------------------------------------------------------------  Cardiac Enzymes Recent Labs  Lab 06/20/18 1942 06/21/18 0200  TROPONINI 0.09* 0.09*   ------------------------------------------------------------------------------------------------------------------  RADIOLOGY:  No results found.  ASSESSMENT AND PLAN:   Active Problems:   Bradycardia   78 year old male with history of chronic atrial fibrillation and aortic valve replacement on  Coumadin who presents from home with shortness of breath and found to have bradycardia.  1.  Symptomatic bradycardia: Intensivist consult and [facility] cardiology consultation appreciated Continue telemetry Hold beta-blocker and continue  transcutaneous pacemaker PRN atropine Echo is pending. Per cardiology plan is to put a permanent pacemaker once his INR is normal.  Holding Coumadin for now. Status post temporary pacemaker placement. Per Dr. Saralyn Gregory, PPM today.  2.  Elevated troponin: Likely due to demand ischemia from symptomatic bradycardia Follow-up echo.  3.   hyperkalemia: One-time dose of lokelma Improved.  4.  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  History of chronic atrial fibrillation: Patient is now in a flutter with slow heart rate Holding metoprolol Coumadin holding due to need of permanent pacemaker placement.  6.  Heart valve replacement: Holding Coumadin for now.  7.  CAD: Continue pravastatin  8.  ARF on CKD stage III Nephrology consult appreciated, improving.  9.  Altered mental status-metabolic encephalopathy due to infection and bradycardia. Mental status improved.  10.  UTI Rocephin is discontinued.  Urine cultures: no growth.no growth..   All the records are reviewed and case discussed with Kyle Gregory. Management plans discussed with the patient, his wife and they are in agreement.  CODE STATUS: DNR  TOTAL TIME TAKING Kyle OF THIS PATIENT: 26 minutes.   Talk to his sister in the room.  POSSIBLE D/C IN 1-2 DAYS, DEPENDING ON CLINICAL CONDITION.   Kyle Gregory M.D on 06/24/2018   Between 7am to 6pm - Pager - [phone]  After 6pm go to [email or web address] - password [email or web address] [facility]  Office  [phone]  CC: Primary Kyle physician; Kyle Aus, MD  Note: This  dictation was prepared with Dragon dictation along with smaller phrase technology. Any transcriptional errors that result from this process are unintentional.

## 2018-06-25 ENCOUNTER — Encounter: Payer: Self-pay | Admitting: Cardiology

## 2018-06-25 LAB — CBC
HCT: 36.6 % — ABNORMAL LOW (ref 39.0–52.0)
Hemoglobin: 11.6 g/dL — ABNORMAL LOW (ref 13.0–17.0)
MCH: 31.7 pg (ref 26.0–34.0)
MCHC: 31.7 g/dL (ref 30.0–36.0)
MCV: 100 fL (ref 80.0–100.0)
Platelets: 109 10*3/uL — ABNORMAL LOW (ref 150–400)
RBC: 3.66 MIL/uL — ABNORMAL LOW (ref 4.22–5.81)
RDW: 15.8 % — ABNORMAL HIGH (ref 11.5–15.5)
WBC: 6.2 10*3/uL (ref 4.0–10.5)
nRBC: 0 % (ref 0.0–0.2)

## 2018-06-25 LAB — BASIC METABOLIC PANEL
Anion gap: 8 (ref 5–15)
BUN: 50 mg/dL — ABNORMAL HIGH (ref 8–23)
CO2: 22 mmol/L (ref 22–32)
Calcium: 8.3 mg/dL — ABNORMAL LOW (ref 8.9–10.3)
Chloride: 108 mmol/L (ref 98–111)
Creatinine, Ser: 1.23 mg/dL (ref 0.61–1.24)
GFR calc Af Amer: >60 mL/min (ref >60)
GFR calc non Af Amer: 56 mL/min — ABNORMAL LOW (ref >60)
GLUCOSE: 87 mg/dL (ref 70–99)
Potassium: 4.4 mmol/L (ref 3.5–5.1)
Sodium: 138 mmol/L (ref 135–145)

## 2018-06-25 LAB — MAGNESIUM: Magnesium: 2.2 mg/dL (ref 1.7–2.4)

## 2018-06-25 LAB — PROTIME-INR
INR: 1.8 — ABNORMAL HIGH (ref 0.8–1.2)
Prothrombin Time: 20.6 seconds — ABNORMAL HIGH (ref 11.4–15.2)

## 2018-06-25 LAB — ECHOCARDIOGRAM COMPLETE: Weight: 3022.95 oz

## 2018-06-25 MED ORDER — APIXABAN 5 MG PO TABS
5.0000 mg | ORAL_TABLET | Freq: Two times a day (BID) | ORAL | Status: DC
  Administered 2018-06-25 – 2018-06-26 (×2): 5 mg via ORAL
  Filled 2018-06-25 (×2): qty 1

## 2018-06-25 NOTE — Progress Notes (Addendum)
ANTICOAGULATION CONSULT NOTE - Initial Consult  Pharmacy Consult for Apixaban Indication: Nonvalvular Atrial Fibrillation   Assessment: 75 yoM had an episode of near syncope and turning blue difficulty beathing. Pt was admitted for bradycardia in ED. PMH consists of anemia, aortic stenosis, arrhythmia, afib, bladder cancer, cardiomyopathy, chf, copd, heart burn, and stroke. Pt was previously on warfarin 4mg  po daily at home. It has been put on hold due to having permanent pacemaker placement. Pharmacy has been consulted for apixaban dosing.   Allergies  Allergen Reactions  . Amiodarone     Other reaction(s): Dizziness  . Donepezil Palpitations    Severe bradycardia    Patient Measurements: Weight: 188 lb 15 oz (85.7 kg)   Vital Signs: Temp: 98.1 F (36.7 C) (02/25 1214) Temp Source: Oral (02/25 1214) BP: 113/71 (02/25 1214) Pulse Rate: 70 (02/25 1214)  Labs: Recent Labs    06/23/18 0422  06/23/18 0720 06/24/18 8841 06/25/18 0418 06/25/18 1658  HGB  --    < > 11.5* 11.8* 11.6*  --   HCT  --   --  36.1* 37.7* 36.6*  --   PLT  --   --  102* 114* 109*  --   LABPROT 27.3*  --   --  24.1*  --  20.6*  INR 2.58  --   --  2.20  --  1.8*  CREATININE  --   --  2.66* 1.74* 1.23  --    < > = values in this interval not displayed.    Estimated Creatinine Clearance: 55.2 mL/min (by C-G formula based on SCr of 1.23 mg/dL).   Medical History: Past Medical History:  Diagnosis Date  . Anemia   . Aortic stenosis   . Arrhythmia   . Atrial fibrillation ([place])   . Bladder cancer ([place])   . Cardiomyopathy ([place])   . CHF (congestive heart failure) ([place])   . COPD (chronic obstructive pulmonary disease) ([place])   . Heartburn   . Stroke ([place])   . VRE (vancomycin resistant enterococcus) culture positive     Medications:  Initiate apixaban 5 mg po BID  Goal of Therapy:  Monitor platelets by anticoagulation protocol: Yes   Plan:  Get updated INR level.  When INR is <2, initiate  apixaban 5mg  po BID.  Monitor CBC and signs of bleeding or clotting.  Benazir Asifa  Mohamed Raffi 06/25/2018,4:38 PM  PharmD Student  Addendum:  INR returned @ 1.8 - will initiate [place], PharmD, BCPS Clinical Pharmacist 06/25/2018 5:33 PM

## 2018-06-25 NOTE — Progress Notes (Signed)
[facility] Cardiology  SUBJECTIVE: The wife feels the patient's mental status is back to baseline at this time. The patient denies pain to incision site. He denies shortness of breath.   Vitals:   06/25/18 0500 06/25/18 0600 06/25/18 0700 06/25/18 0800  BP:      Pulse: 73 70 70 70  Resp: (!) 21 15 17 16   Temp:    (!) 96.7 F (35.9 C)  TempSrc:    Axillary  SpO2: 95% 91% 93% 94%  Weight:         Intake/Output Summary (Last 24 hours) at 06/25/2018 0850 Last data filed at 06/25/2018 0800 Gross per 24 hour  Intake 510 ml  Output 805 ml  Net -295 ml      PHYSICAL EXAM  General: Elderly gentleman sitting up in bed eating breakfast, well developed, well nourished, in no acute distress HEENT:  Normocephalic and atramatic Neck:  No JVD.  Lungs: normal effort of breathing on room air, no wheezing Heart: HRRR . Normal S1 and S2 without gallops or murmurs.  Incision site: Outer Telfa pad removed revealing intact steri strips without active bleeding, surrounding edema, warmth, or erythema  Abdomen: nondistended Msk:  Back normal, gait not assessed. Patient moving upper extremities  Extremities: mild bilateral lower extremities. Cold extremities Neuro: Alert. Oriented to person  Psych:  Good affect, baseline dementia   LABS: Basic Metabolic Panel: Recent Labs    06/24/18 0613 06/25/18 0418  NA 137 138  K 4.3 4.4  CL 105 108  CO2 22 22  GLUCOSE 87 87  BUN 60* 50*  CREATININE 1.74* 1.23  CALCIUM 8.5* 8.3*  MG  --  2.2   Liver Function Tests: No results for input(s): AST, ALT, ALKPHOS, BILITOT, PROT, ALBUMIN in the last 72 hours. No results for input(s): LIPASE, AMYLASE in the last 72 hours. CBC: Recent Labs    06/24/18 0613 06/25/18 0418  WBC 6.0 6.2  HGB 11.8* 11.6*  HCT 37.7* 36.6*  MCV 100.0 100.0  PLT 114* 109*   Cardiac Enzymes: No results for input(s): CKTOTAL, CKMB, CKMBINDEX, TROPONINI in the last 72 hours. BNP: Invalid input(s): POCBNP D-Dimer: No results  for input(s): DDIMER in the last 72 hours. Hemoglobin A1C: No results for input(s): HGBA1C in the last 72 hours. Fasting Lipid Panel: No results for input(s): CHOL, HDL, LDLCALC, TRIG, CHOLHDL, LDLDIRECT in the last 72 hours. Thyroid Function Tests: No results for input(s): TSH, T4TOTAL, T3FREE, THYROIDAB in the last 72 hours.  Invalid input(s): FREET3 Anemia Panel: No results for input(s): VITAMINB12, FOLATE, FERRITIN, TIBC, IRON, RETICCTPCT in the last 72 hours.  Dg Chest Port 1 View  Result Date: 06/24/2018 CLINICAL DATA:  Status post pacemaker placement EXAM: PORTABLE CHEST 1 VIEW COMPARISON:  06/20/2018 FINDINGS: Cardiac shadow remains enlarged. Postsurgical changes are again seen. New single lead pacemaker is noted in satisfactory position. No pneumothorax is seen. Mild vascular congestion is noted centrally as well as some atelectatic changes in the left retrocardiac region. No bony abnormality is seen. IMPRESSION: No evidence of pneumothorax. Mild left retrocardiac atelectasis and vascular congestion. Electronically Signed   By: Inez Catalina M.D.   On: 06/24/2018 16:16   Dg C-arm 1-60 Min-no Report  Result Date: 06/24/2018 Fluoroscopy was utilized by the requesting physician.  No radiographic interpretation.    TELEMETRY: Ventricular paced rhythm, 70s  ASSESSMENT AND PLAN:  Active Problems:   Bradycardia    1. Bradycardia, resolved, status post  2. Chronic atrial fibrillation/flutter, rate controlled 3. Status  post tissue prosthetic aortic valve replacement, 08/2016 4. Status post CABG x 3, 08/2016 5. Dementia, wife reports patient is at his baseline mental status  Recommendations: 1. Please make follow-up appointment with Dr. Ubaldo Glassing 10 days post-op, approximately March 5th. 2. Recommend starting patient on Keflex 250 mg QID x 7 days 3. Recommend switching from warfarin to Eliquis 5 mg BID 4. Discussed with patient, with his wife present, that patient needs to avoid lifting  left arm above head, and lifting greater than 15 pounds for the next 6 weeks. He may shower/bathe tomorrow, but avoid getting incision site wet. Pat dry. Leave steri strips alone; they will be removed as outpatient at follow-up appointment with Dr. Ubaldo Glassing. [employer] CareLink pacemaker interrogation device will arrive in the mail, and the wife was advised to plug the device into an outlet in the room where the patient sleeps, within 10 feet of where he sleeps, and to turn it on. Further instructions regarding this will be discussed at follow-up appointment.  Sign off for now; call with any questions.     Clabe Seal, PA-C 06/25/2018 8:50 AM

## 2018-06-25 NOTE — Progress Notes (Signed)
PT Cancellation Note  Patient Details Name: Kyle Gregory MRN: [id number] DOB: 11/24/1940   Cancelled Treatment:    Reason Eval/Treat Not Completed: Patient declined, no reason specified.  Pt refused multiple attempts at PT evaluation stating "I'll do it tomorrow".  Sitter attempted to convince pt to sit at [facility] with pt again refusing.  Pt mildly agitated and alert to self only.  Will attempt to see pt at a future date/time as appropriate.     Linus Salmons PT, DPT 06/25/18, 1:26 PM

## 2018-06-25 NOTE — Progress Notes (Signed)
OT Cancellation Note  Patient Details Name: Kyle Gregory MRN: [id number] DOB: 01/08/1941   Cancelled Treatment:    Reason Eval/Treat Not Completed: Other (comment). Consult received, chart reviewed. Pt refusing therapy this afternoon. Per PT, pt mildly agitated and oriented to self. Sitter present. Will hold OT evaluation and re-attempt next date as medically appropriate in attempt to maximize pt's participation.   Jeni Salles, MPH, MS, OTR/L ascom [phone] 06/25/18, 2:20 PM

## 2018-06-25 NOTE — Progress Notes (Signed)
[facility], [place] 06/25/18  Subjective:  Patient very confused and agitated this a.m. Nursing staff confirms this overnight. Renal function has improved.   Objective:  Vital signs in last 24 hours:  Temp:  [97.1 F (36.2 C)-97.8 F (36.6 C)] 97.8 F (36.6 C) (02/25 0200) Pulse Rate:  [57-75] 70 (02/25 0400) Resp:  [14-34] 17 (02/25 0400) BP: (104-132)/(62-84) 107/70 (02/25 0400) SpO2:  [86 %-100 %] 93 % (02/25 0400)  Weight change:  Filed Weights   06/20/18 1144  Weight: 85.7 kg    Intake/Output:    Intake/Output Summary (Last 24 hours) at 06/25/2018 0753 Last data filed at 06/24/2018 2300 Gross per 24 hour  Intake 360 ml  Output 730 ml  Net -370 ml     Physical Exam: General:  Frail, elderly gentleman, laying in the bed  HEENT  moist oral mucous membranes, decreased hearing  Neck:  supple  Lungs:  Coarse breath sounds bilaterally, O2 [place]  Heart::  Irregular rhythm  Abdomen:  Soft, nontender, BS present  Extremities:  Trace edema  Neurologic:  Alert, able to follow commands  Skin:  Scattered ecchymosis  Foley:  Catheter in place     Basic Metabolic Panel:  Recent Labs  Lab 06/21/18 0559 06/22/18 0424 06/23/18 0720 06/24/18 0613 06/25/18 0418  NA 137 136 134* 137 138  K 5.1 4.9 4.7 4.3 4.4  CL 104 102 102 105 108  CO2 19* 22 24 22 22   GLUCOSE 97 97 85 87 87  BUN 50* 72* 73* 60* 50*  CREATININE 2.80* 3.75* 2.66* 1.74* 1.23  CALCIUM 9.3 8.7* 8.4* 8.5* 8.3*  MG  --   --   --   --  2.2     CBC: Recent Labs  Lab 06/21/18 0200 06/22/18 0424 06/23/18 0720 06/24/18 0613 06/25/18 0418  WBC 13.1* 10.6* 6.7 6.0 6.2  HGB 14.1 12.1* 11.5* 11.8* 11.6*  HCT 44.0 37.5* 36.1* 37.7* 36.6*  MCV 97.1 99.2 98.9 100.0 100.0  PLT 191 129* 102* 114* 109*     No results found for: HEPBSAG, HEPBSAB, HEPBIGM    Microbiology:  Recent Results (from the past 240 hour(s))  MRSA PCR Screening     Status: Abnormal   Collection  Time: 06/20/18  4:52 PM  Result Value Ref Range Status   MRSA by PCR POSITIVE (A) NEGATIVE Final    Comment:        The GeneXpert MRSA Assay (FDA approved for NASAL specimens only), is one component of a comprehensive MRSA colonization surveillance program. It is not intended to diagnose MRSA infection nor to guide or monitor treatment for MRSA infections. CRITICAL RESULT CALLED TO, READ BACK BY AND VERIFIED WITH: CALLED TO BRANDY MANSFIELD @1900  06/20/2018 [facility] Performed at [facility], [address] [place], [place] [address]   Urine Culture     Status: None   Collection Time: 06/20/18  6:26 PM  Result Value Ref Range Status   Specimen Description   Final    URINE, CATHETERIZED Performed at [facility], [address] [place], [place] [address]    Special Requests   Final    Normal Performed at [facility], [address] [place], [place] [address]    Culture   Final    NO GROWTH Performed at [facility], [place] [address], [place],  [address]    Report Status 06/21/2018 FINAL  Final    Coagulation Studies: Recent Labs  06/23/18 0422 06/24/18 0613  LABPROT 27.3* 24.1*  INR 2.58 2.20    Urinalysis: No results for input(s): COLORURINE, LABSPEC, PHURINE, GLUCOSEU, HGBUR, BILIRUBINUR, KETONESUR, PROTEINUR, UROBILINOGEN, NITRITE, LEUKOCYTESUR in the last 72 hours.  Invalid input(s): APPERANCEUR    Imaging: Dg Chest Port 1 View  Result Date: 06/24/2018 CLINICAL DATA:  Status post pacemaker placement EXAM: PORTABLE CHEST 1 VIEW COMPARISON:  06/20/2018 FINDINGS: Cardiac shadow remains enlarged. Postsurgical changes are again seen. New single lead pacemaker is noted in satisfactory position. No pneumothorax is seen. Mild vascular congestion is noted centrally as well as some atelectatic changes in the left retrocardiac region. No bony abnormality is seen. IMPRESSION: No evidence of pneumothorax. Mild left retrocardiac  atelectasis and vascular congestion. Electronically Signed   By: Inez Catalina M.D.   On: 06/24/2018 16:16   Dg C-arm 1-60 Min-no Report  Result Date: 06/24/2018 Fluoroscopy was utilized by the requesting physician.  No radiographic interpretation.     Medications:    . calcium-vitamin D  1 tablet Oral Daily  . Chlorhexidine Gluconate Cloth  6 each Topical Q0600  . ferrous sulfate  325 mg Oral BID WC  . FLUoxetine  20 mg Oral Daily  . mouth rinse  15 mL Mouth Rinse BID  . Melatonin  1 tablet Oral QHS  . mupirocin ointment  1 application Nasal BID  . pantoprazole  40 mg Oral Daily  . pravastatin  40 mg Oral QHS   acetaminophen, bisacodyl, HYDROcodone-acetaminophen, nitroGLYCERIN, ondansetron (ZOFRAN) IV, polyethylene glycol  Assessment/ Plan:  78 y.o. 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   1.  Acute kidney injury Baseline creatinine 1.16 in December 2019; AKI appears to be secondary to ATN from hemodynamic instability, hypotension -Renal function continues to improve.  Creatinine down to 1.2 with a BUN of 50.  Avoid nephrotoxins as possible.  Continue to monitor renal parameters.  2.  Severe hyperkalemia Patient was on Spironolactone at home.  Agree with discontinuing -Hyperkalemia significantly improved.  Potassium currently 4.4.  3.  Cardiac history- atrial fibrillation, flutter, h/o bradycardia, history of dilated cardiomyopathy, CHF, aortic valve replacement, three-vessel CABG May 2018 at [facility], peripheral arterial disease, post CABG stroke.  Last outpatient echo from August 2019 shows severe LV systolic dysfunction with mild LVH, moderate RV systolic  dysfunction, mild valvular regurgitation, EF 20 to 25%.  Now admitted for severe bradycardia    LOS: 5 [place] Rounds 2/25/20207:53 AM  [place], [place]  Note: This note was prepared with Dragon dictation. Any transcription errors are unintentional

## 2018-06-25 NOTE — Progress Notes (Signed)
   CHIEF COMPLAINT:   Severe bradycardia  Subjective  S/p pacemaker placement Confused this AM Sitter at bedside       Objective   VITALS: BP 107/70   Pulse 70   Temp 97.8 F (36.6 C) (Oral)   Resp 17   Wt 85.7 kg   SpO2 93%   BMI 25.62 kg/m     Review of Systems:  unobtainable   Physical Examination:   GENERAL:NAD, no fevers, chills, no weakness no fatigue HEAD: Normocephalic, atraumatic.  EYES: PERLA, EOMI No scleral icterus.  MOUTH: Moist mucosal membrane.  EAR, NOSE, THROAT: Clear without exudates. No external lesions.  NECK: Supple. No thyromegaly.  No JVD.  PULMONARY: CTA B/L no wheezing, rhonchi, crackles CARDIOVASCULAR: S1 and S2. Regular rate and rhythm. No murmurs GASTROINTESTINAL: Soft, nontender, nondistended. Positive bowel sounds.  MUSCULOSKELETAL: No swelling, clubbing, or edema.  Incision site clean inatct NEUROLOGIC: No gross focal neurological deficits. 5/5 strength all extremities SKIN: No ulceration, lesions, rashes, or cyanosis.  PSYCHIATRIC: poor insight ALL OTHER ROS ARE NEGATIVE    A/plan Severe bradycardia with chronic afib/aflutter DNR/DNI  Follow up cardiology recs Follow up nephrology recs  Remains SD status    Jissel Slavens Patricia Pesa, M.D.  [facility]  Medical Director [place] Director [facility] Cardio-Pulmonary Department

## 2018-06-25 NOTE — Progress Notes (Signed)
[facility] at [place] NAME: Kyle Gregory    MR#:  [id number]  DATE OF BIRTH:  [birth year]  SUBJECTIVE:  CHIEF COMPLAINT:   Chief Complaint  Patient presents with  . Atrial Flutter  . Shortness of Breath   The patient has no complaints.  He is confused this morning and on one-to-one sitter. REVIEW OF SYSTEMS:   Review of Systems  Constitutional: Negative for chills, fever and malaise/fatigue.  HENT: Negative for sore throat.   Eyes: Negative for blurred vision and double vision.  Respiratory: Negative for cough, hemoptysis, shortness of breath, wheezing and stridor.   Cardiovascular: Negative for chest pain, palpitations, orthopnea and leg swelling.  Gastrointestinal: Negative for abdominal pain, blood in stool, diarrhea, melena, nausea and vomiting.  Genitourinary: Negative for dysuria, flank pain and hematuria.  Musculoskeletal: Negative for back pain and joint pain.  Skin: Negative for rash.  Neurological: Negative for dizziness, sensory change, focal weakness, seizures, loss of consciousness, weakness and headaches.  Endo/Heme/Allergies: Negative for polydipsia.  Psychiatric/Behavioral: Negative for depression. The patient is not nervous/anxious.     DRUG ALLERGIES:   Allergies  Allergen Reactions  . Amiodarone     Other reaction(s): Dizziness  . Donepezil Palpitations    Severe bradycardia    VITALS:  Blood pressure 113/71, pulse 70, temperature 98.1 F (36.7 C), temperature source Oral, resp. rate 16, weight 85.7 kg, SpO2 98 %.  PHYSICAL EXAMINATION:  GENERAL:  78 y.o.-year-old patient lying in the bed with no acute distress.   Physical Exam Constitutional:      General: He is not in acute distress.    Appearance: Normal appearance.  HENT:     Head: Normocephalic.     Mouth/Throat:     Mouth: Mucous membranes are moist.  Eyes:     General: No scleral icterus.    Conjunctiva/sclera: Conjunctivae normal.     Pupils:  Pupils are equal, round, and reactive to light.  Neck:     Musculoskeletal: Normal range of motion and neck supple.     Vascular: No JVD.     Trachea: No tracheal deviation.  Cardiovascular:     Rate and Rhythm: Normal rate. Rhythm irregular.     Heart sounds: Normal heart sounds. No murmur. No gallop.   Pulmonary:     Effort: Pulmonary effort is normal. No respiratory distress.     Breath sounds: Normal breath sounds. No wheezing or rales.  Abdominal:     General: Bowel sounds are normal. There is no distension.     Palpations: Abdomen is soft.     Tenderness: There is no abdominal tenderness. There is no rebound.  Musculoskeletal: Normal range of motion.        General: No tenderness.     Right lower leg: No edema.     Left lower leg: No edema.  Skin:    Findings: No erythema or rash.  Neurological:     General: No focal deficit present.     Mental Status: He is alert.     Cranial Nerves: No cranial nerve deficit.     Comments: AAOx1-2  Psychiatric:     Comments: Confused.    LABORATORY PANEL:   CBC Recent Labs  Lab 06/25/18 0418  WBC 6.2  HGB 11.6*  HCT 36.6*  PLT 109*   ------------------------------------------------------------------------------------------------------------------  Chemistries  Recent Labs  Lab 06/25/18 0418  NA 138  K 4.4  CL 108  CO2 22  GLUCOSE 87  BUN 50*  CREATININE 1.23  CALCIUM 8.3*  MG 2.2   ------------------------------------------------------------------------------------------------------------------  Cardiac Enzymes Recent Labs  Lab 06/20/18 1942 06/21/18 0200  TROPONINI 0.09* 0.09*   ------------------------------------------------------------------------------------------------------------------  RADIOLOGY:  Dg Chest Port 1 View  Result Date: 06/24/2018 CLINICAL DATA:  Status post pacemaker placement EXAM: PORTABLE CHEST 1 VIEW COMPARISON:  06/20/2018 FINDINGS: Cardiac shadow remains enlarged. Postsurgical  changes are again seen. New single lead pacemaker is noted in satisfactory position. No pneumothorax is seen. Mild vascular congestion is noted centrally as well as some atelectatic changes in the left retrocardiac region. No bony abnormality is seen. IMPRESSION: No evidence of pneumothorax. Mild left retrocardiac atelectasis and vascular congestion. Electronically Signed   By: Inez Catalina M.D.   On: 06/24/2018 16:16   Dg C-arm 1-60 Min-no Report  Result Date: 06/24/2018 Fluoroscopy was utilized by the requesting physician.  No radiographic interpretation.    ASSESSMENT AND PLAN:   Active Problems:   Bradycardia   78 year old male with history of chronic atrial fibrillation and aortic valve replacement on Coumadin who presents from home with shortness of breath and found to have bradycardia.  1.  Symptomatic bradycardia: Intensivist consult and [facility] cardiology consultation appreciated Continue telemetry Hold beta-blocker and continue  transcutaneous pacemaker PRN atropine Echo is pending. Per cardiology plan is to put a permanent pacemaker once his INR is normal.  Holding Coumadin for now. Status post temporary pacemaker placement. S/P PPM, heart rate is normal.  2.  Elevated troponin: Likely due to demand ischemia from symptomatic bradycardia Follow-up echo report.  3.   hyperkalemia: One-time dose of lokelma Improved.  4.  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  5.  History of chronic atrial fibrillation: Patient is now in a flutter with slow heart rate Holding metoprolol Coumadin holding due to need of permanent pacemaker placement.  6.  Heart valve replacement: Holding Coumadin for now.  7.  CAD: Continue pravastatin  8.  ARF on CKD stage III Nephrology consult appreciated, improving.  9.   Altered mental status-metabolic encephalopathy due to infection and bradycardia. Mental status improved but confused again in ICU.  Possible due to ICU stay and underlying dementia.  10.  UTI Rocephin is discontinued.  Urine cultures: no growth.no growth..   All the records are reviewed and case discussed with Care Management/Social Workerr. Management plans discussed with the patient, his wife and they are in agreement.  CODE STATUS: DNR  TOTAL TIME TAKING CARE OF THIS PATIENT: 26 minutes.   Talk to his sister in the room.  POSSIBLE D/C IN 1-2 DAYS, DEPENDING ON CLINICAL CONDITION.   [name] M.D on 06/25/2018   Between 7am to 6pm - Pager - [phone]  After 6pm go to [email or web address] - password [email or web address] [facility]  Office  [phone]  CC: Primary care physician; Rusty Aus, MD  Note: This dictation was prepared with Dragon dictation along with smaller phrase technology. Any transcriptional errors that result from this process are unintentional.

## 2018-06-26 ENCOUNTER — Encounter: Payer: Self-pay | Admitting: Internal Medicine

## 2018-06-26 LAB — GLUCOSE, CAPILLARY
GLUCOSE-CAPILLARY: 100 mg/dL — AB (ref 70–99)
Glucose-Capillary: 78 mg/dL (ref 70–99)

## 2018-06-26 MED ORDER — CEPHALEXIN 250 MG PO CAPS: 250.0000 mg | ORAL_CAPSULE | Freq: Four times a day (QID) | ORAL | 0 refills | Status: AC

## 2018-06-26 MED ORDER — APIXABAN 5 MG PO TABS: 5.0000 mg | ORAL_TABLET | Freq: Two times a day (BID) | ORAL | 0 refills | Status: AC

## 2018-06-26 NOTE — Progress Notes (Signed)
[place] at [place] NAME: Kyle Gregory    MR#:  [id number]  DATE OF BIRTH:  Apr 25, 1941  SUBJECTIVE:   Confusion at baseline/improved  REVIEW OF SYSTEMS:    Review of Systems  Constitutional: Negative for fever, chills weight loss HENT: Negative for ear pain, nosebleeds, congestion, facial swelling, rhinorrhea, neck pain, neck stiffness and ear discharge.   Respiratory: Negative for cough, shortness of breath, wheezing  Cardiovascular: Negative for chest pain, palpitations and leg swelling.  Gastrointestinal: Negative for heartburn, abdominal pain, vomiting, diarrhea or consitpation Genitourinary: Negative for dysuria, urgency, frequency, hematuria Musculoskeletal: Negative for back pain or joint pain Neurological: Negative for dizziness, seizures, syncope, focal weakness,  numbness and headaches.  Hematological: Does not bruise/bleed easily.  Psychiatric/Behavioral: Negative for hallucinations, ++confusion, no dysphoric mood    Tolerating Diet: yes      DRUG ALLERGIES:   Allergies  Allergen Reactions  . Amiodarone     Other reaction(s): Dizziness  . Donepezil Palpitations    Severe bradycardia    VITALS:  Blood pressure 114/65, pulse 70, temperature 97.8 F (36.6 C), temperature source Oral, resp. rate 18, weight 85.8 kg, SpO2 95 %.  PHYSICAL EXAMINATION:  Constitutional: Appears well-developed and well-nourished. No distress. HENT: Normocephalic. Marland Kitchen Oropharynx is clear and moist.  Eyes: Conjunctivae and EOM are normal. PERRLA, no scleral icterus.  Neck: Normal ROM. Neck supple. No JVD. No tracheal deviation. CVS: RRR, S1/S2 +, no murmurs, no gallops, no carotid bruit.  Pulmonary: Effort and breath sounds normal, no stridor, rhonchi, wheezes, rales.  Abdominal: Soft. BS +,  no distension, tenderness, rebound or guarding.  Musculoskeletal: Normal range of motion. No edema and no tenderness.  Neuro: Alert. CN 2-12 grossly  intact. No focal deficits. Skin: Skin is warm and dry. No rash noted. Incision c/i Psychiatric: confused at times to place and time able to follow al commands      LABORATORY PANEL:   CBC Recent Labs  Lab 06/25/18 0418  WBC 6.2  HGB 11.6*  HCT 36.6*  PLT 109*   ------------------------------------------------------------------------------------------------------------------  Chemistries  Recent Labs  Lab 06/25/18 0418  NA 138  K 4.4  CL 108  CO2 22  GLUCOSE 87  BUN 50*  CREATININE 1.23  CALCIUM 8.3*  MG 2.2   ------------------------------------------------------------------------------------------------------------------  Cardiac Enzymes Recent Labs  Lab 06/20/18 1605 06/20/18 1942 06/21/18 0200  TROPONINI 0.08* 0.09* 0.09*   ------------------------------------------------------------------------------------------------------------------  RADIOLOGY:  Dg Chest Port 1 View  Result Date: 06/24/2018 CLINICAL DATA:  Status post pacemaker placement EXAM: PORTABLE CHEST 1 VIEW COMPARISON:  06/20/2018 FINDINGS: Cardiac shadow remains enlarged. Postsurgical changes are again seen. New single lead pacemaker is noted in satisfactory position. No pneumothorax is seen. Mild vascular congestion is noted centrally as well as some atelectatic changes in the left retrocardiac region. No bony abnormality is seen. IMPRESSION: No evidence of pneumothorax. Mild left retrocardiac atelectasis and vascular congestion. Electronically Signed   By: Inez Catalina M.D.   On: 06/24/2018 16:16   Dg C-arm 1-60 Min-no Report  Result Date: 06/24/2018 Fluoroscopy was utilized by the requesting physician.  No radiographic interpretation.     ASSESSMENT AND PLAN:   78 year old male with history of chronic atrial fibrillation/flutter who presented with symptomatic bradycardia.  1.  Symptomatic bradycardia status post pacemaker placement Patient will have outpatient follow-up with cardiology  in 10 days.  2.  Chronic atrial fibrillation/flutter: Recommendation by cardiology is to discontinue Coumadin and start Eliquis.  3.  Elevated troponin due to demand ischemia from bradycardia.  Patient ruled out for ACS  4.  Acute encephalopathy from acute delirium being in the hospital: Patient appears to have shown some improvement with some mild confusion at discharge  5.  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  Due to low/normal blood pressure Lasix has been discontinued for now.  Patient will need to have this restarted as outpatient.  6.  CAD: Continue statin  Ready for discharge but wife says she does not have 24-hour supervision as physical therapy has recommended.  Case management assisting with discharge planning      Management plans discussed with the patient and he is in agreement.  CODE STATUS: dnr  TOTAL TIME TAKING CARE OF THIS PATIENT: 26 minutes.     POSSIBLE D/C today, DEPENDING ON case management d/c planning   Vallory Oetken M.D on 06/26/2018 at 12:41 PM  Between 7am to 6pm - Pager - [phone] After 6pm go to [email or web address] - password [email or web address] [facility]  Office  [phone]  CC: Primary care physician; Rusty Aus, MD  Note: This dictation was prepared with Dragon dictation along with smaller phrase technology. Any transcriptional errors that result from this process are unintentional.

## 2018-06-26 NOTE — Evaluation (Signed)
Physical Therapy Evaluation Patient Details Name: Kyle Gregory MRN: [id number] DOB: 12-30-1940 Today's Date: 06/26/2018   History of Present Illness  From MD H&P:  Pt is a 78 y.o. male with a known history of chronic atrial fibrillation/aortic valve replacement with bioprosthetic valve on anticoagulation, COPD, CAD status post CABG and dilated cardiomyopathy.  Pt presented from home due to shortness of breath.  Pt was noted to have bradycardia with heart rates in the 20s to 30s.  He was given atropine.  Pt is now s/p temp pacemaker placement.  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    Clinical Impression  Pt presents with deficits in strength, transfers, mobility, gait, balance, and activity tolerance.  Pt was pleasant and able/willing to follow simple commands this session.  Per nsg sitter pt was recently taken off 1LO2/min and was on room air upon entry with baseline SpO2 91-92% and HR in the 70s.  Pt was Mod Ind with bed mobility tasks and CGA with sit to/from stand transfers from an elevated surface with cues for proper sequencing provided.  Pt was able to amb 1 x 100' and 1 x 15' with a RW and CGA with cues for upright posture and amb closer to the RW.  Pt's cadence was slow with short B step length but no instability noted.  Pt's SpO2 dropped to 88-89% after amb with HR in the low 80s with SpO2 quickly increasing to 91-92% upon sitting.  No adverse symptoms noted by pt during session.  Pt will benefit from continuation of HHPT services upon discharge to safely address above deficits for decreased caregiver assistance and eventual return to PLOF.      Follow Up Recommendations Home health PT;Supervision/Assistance - 24 hour;Other (comment)(Pt was receiving HHPT prior to  this admission)    Equipment Recommendations  None recommended by PT    Recommendations for Other Services       Precautions / Restrictions Precautions Precautions: ICD/Pacemaker Precaution Comments: No lifting >15 lbs, no lifting LUE over head Restrictions Weight Bearing Restrictions: No      Mobility  Bed Mobility Overal bed mobility: Modified Independent             General bed mobility comments: Extra time and effort but no physical assistance required  Transfers Overall transfer level: Needs assistance Equipment used: Rolling walker (2 wheeled) Transfers: Sit to/from Stand Sit to Stand: From elevated surface;Min guard         General transfer comment: Mod verbal cues for sequencing most notably for hand placement  Ambulation/Gait Ambulation/Gait assistance: Min guard Gait Distance (Feet): 100 Feet Assistive device: Rolling walker (2 wheeled) Gait Pattern/deviations: Step-through pattern;Trunk flexed;Decreased step length - right;Decreased step length - left Gait velocity: decreased   General Gait Details: Slow cadence and short B step length with amb with mod verbal cues for amb closer to the RW with upright posture  Stairs            Wheelchair Mobility    Modified Rankin (Stroke Patients Only)       Balance Overall balance assessment: Mild deficits observed, not formally tested  Pertinent Vitals/Pain Pain Assessment: No/denies pain    Home Living Family/patient expects to be discharged to:: Private residence(History from chart review with pt a poor historian and no family available to assist) Living Arrangements: Spouse/significant other Available Help at Discharge: Family Type of Home: House Home Access: Ramped entrance     Home Layout: Two level;Able to live on main level with bedroom/bathroom Home Equipment: Shower seat;Walker - 2 wheels;Wheelchair - manual;Cane - single  point      Prior Function Level of Independence: Needs assistance   Gait / Transfers Assistance Needed: Pt instructed to amb with a RW but compliance limited by cognition  ADL's / Homemaking Assistance Needed: Pt has a shower bench which he sits on and wife assists with showers occasionally        Hand Dominance   Dominant Hand: Right    Extremity/Trunk Assessment   Upper Extremity Assessment Upper Extremity Assessment: Defer to OT evaluation;LUE deficits/detail LUE Deficits / Details: Limited by pacemaker precautions    Lower Extremity Assessment Lower Extremity Assessment: Generalized weakness       Communication   Communication: No difficulties  Cognition Arousal/Alertness: Awake/alert Behavior During Therapy: WFL for tasks assessed/performed Overall Cognitive Status: No family/caregiver present to determine baseline cognitive functioning                                 General Comments: Pt able to follow simple commands with min-mod verbal/tactile cues but A&O to self only      General Comments      Exercises Total Joint Exercises Ankle Circles/Pumps: Strengthening;Both;10 reps;15 reps Quad Sets: Strengthening;Both;10 reps;15 reps Gluteal Sets: Strengthening;Both;5 reps(Difficulty sequencing) Towel Squeeze: Strengthening;Both;5 reps Hip ABduction/ADduction: AROM;Both;10 reps Straight Leg Raises: AROM;Both;10 reps Long Arc Quad: Strengthening;Both;10 reps;15 reps Knee Flexion: Strengthening;Both;10 reps;15 reps Marching in Standing: AROM;Both;5 reps;Standing   Assessment/Plan    PT Assessment Patient needs continued PT services  PT Problem List Decreased strength;Decreased activity tolerance;Decreased balance;Decreased mobility;Decreased knowledge of use of DME       PT Treatment Interventions DME instruction;Gait training;Functional mobility training;Therapeutic activities;Therapeutic exercise;Balance training;Patient/family education     PT Goals (Current goals can be found in the Care Plan section)  Acute Rehab PT Goals PT Goal Formulation: Patient unable to participate in goal setting Time For Goal Achievement: 07/09/18 Potential to Achieve Goals: Good    Frequency Min 2X/week   Barriers to discharge        Co-evaluation               AM-PAC PT "6 Clicks" Mobility  Outcome Measure Help needed turning from your back to your side while in a flat bed without using bedrails?: A Little Help needed moving from lying on your back to sitting on the side of a flat bed without using bedrails?: A Little Help needed moving to and from a bed to a chair (including a wheelchair)?: A Little Help needed standing up from a chair using your arms (e.g., wheelchair or bedside chair)?: A Little Help needed to walk in hospital room?: A Little Help needed climbing 3-5 steps with a railing? : A Lot 6 Click Score: 17    End of Session Equipment Utilized During Treatment: Gait belt Activity Tolerance: Patient tolerated treatment well Patient left: in chair;with nursing/sitter in room;with call bell/phone within reach;with SCD's reapplied(No chair alarm per nsg due to sitter in room) Nurse Communication: Mobility status PT Visit Diagnosis: Muscle weakness (generalized) (  M62.81);Difficulty in walking, not elsewhere classified (R26.2)    Time: 3557-3220 PT Time Calculation (min) (ACUTE ONLY): 28 min   Charges:   PT Evaluation $PT Eval Low Complexity: 1 Low PT Treatments $Therapeutic Exercise: 8-22 mins       D. Scott Audreena Sachdeva PT, DPT 06/26/18, 10:12 AM

## 2018-06-26 NOTE — Progress Notes (Signed)
IVs and tele removed from patient. Discharge instructions given to wife along with hard copy prescriptions. Verbalized understanding. No acute distress at this time. Wife to transport patient home.

## 2018-06-26 NOTE — Discharge Summary (Signed)
[facility] [place] [facility] NAME: Kyle Gregory    MR#:  [id number]  DATE OF BIRTH:  04-May-1940  DATE OF ADMISSION:  06/20/2018 ADMITTING PHYSICIAN: Bettey Costa, MD  DATE OF DISCHARGE: 06/26/2018  PRIMARY CARE PHYSICIAN: Rusty Aus, MD    ADMISSION DIAGNOSIS:  Bradycardia [R00.1] Near syncope [R55]  DISCHARGE DIAGNOSIS:  Active Problems:   Bradycardia   SECONDARY DIAGNOSIS:   Past Medical History:  Diagnosis Date  . Anemia   . Aortic stenosis   . Arrhythmia   . Atrial fibrillation ([place])   . Bladder cancer ([place])   . Cardiomyopathy ([place])   . CHF (congestive heart failure) ([place])   . COPD (chronic obstructive pulmonary disease) ([place])   . Heartburn   . Stroke ([place])   . VRE (vancomycin resistant enterococcus) culture positive     HOSPITAL COURSE:    78 year old male with history of chronic atrial fibrillation/flutter who presented with symptomatic bradycardia.  1.  Symptomatic bradycardia status post pacemaker placement Patient will have outpatient follow-up with cardiology in 10 days.  2.  Chronic atrial fibrillation/flutter: Recommendation by cardiology is to discontinue Coumadin and start Eliquis.  3.  Elevated troponin due to demand ischemia from bradycardia.  Patient ruled out for ACS  4.  Acute encephalopathy from acute delirium being in the hospital: Patient appears to have shown some improvement with some mild confusion at discharge  5.  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  Due to low/normal blood pressure Lasix has been discontinued for now.  Patient will need to have this restarted as outpatient.  6.  CAD: Continue statin  Would benefit from outpatient palliative care services   DISCHARGE CONDITIONS AND DIET:   Stable for discharge heart healthy cardiac diet  CONSULTS OBTAINED:  Treatment Team:  Murlean Iba, MD  DRUG  ALLERGIES:   Allergies  Allergen Reactions  . Amiodarone     Other reaction(s): Dizziness  . Donepezil Palpitations    Severe bradycardia    DISCHARGE MEDICATIONS:   Allergies as of 06/26/2018      Reactions   Amiodarone    Other reaction(s): Dizziness   Donepezil Palpitations   Severe bradycardia      Medication List    STOP taking these medications   furosemide 40 MG tablet Commonly known as:  LASIX   spironolactone 25 MG tablet Commonly known as:  ALDACTONE   warfarin 2 MG tablet Commonly known as:  COUMADIN     TAKE these medications   apixaban 5 MG Tabs tablet Commonly known as:  ELIQUIS Take 1 tablet (5 mg total) by mouth 2 (two) times daily.   cephALEXin 250 MG capsule Commonly known as:  KEFLEX Take 1 capsule (250 mg total) by mouth 4 (four) times daily for 7 days.   [employer] ZINC OXIDE 20 % ointment Generic drug:  zinc oxide Apply 1 application topically 2 (two) times daily as needed for irritation.   ferrous sulfate 325 (65 FE) MG tablet Take 325 mg by mouth 2 (two) times daily with a meal.   FLUoxetine 20 MG capsule Commonly known as:  PROZAC Take 20 mg by mouth daily.   Melatonin 3 MG Tabs Take 1 tablet by mouth at bedtime.   nitroGLYCERIN 0.4 MG SL tablet Commonly known as:  NITROSTAT Place 0.4 mg under the tongue every 5 (five) minutes as needed for chest pain (Up to 3  doses).   omeprazole 20 MG capsule Commonly known as:  PRILOSEC Take 20 mg by mouth daily as needed (Heartburn or acid reflux).   OYSTER SHELL CALCIUM 500 + D 500-125 MG-UNIT Tabs Generic drug:  Calcium Carbonate-Vitamin D Take 1 tablet by mouth daily.   pravastatin 40 MG tablet Commonly known as:  PRAVACHOL Take 1 tablet by mouth at bedtime.         Today   CHIEF COMPLAINT:   According to wife patient has dementia.  Appears to be at baseline.   VITAL SIGNS:  Blood pressure 114/65, pulse 70, temperature 97.8 F (36.6 C), temperature source Oral, resp. rate  18, weight 85.8 kg, SpO2 95 %.   REVIEW OF SYSTEMS:  Review of Systems  Constitutional: Negative.  Negative for chills, fever and malaise/fatigue.  HENT: Negative.  Negative for ear discharge, ear pain, hearing loss, nosebleeds and sore throat.   Eyes: Negative.  Negative for blurred vision and pain.  Respiratory: Negative.  Negative for cough, hemoptysis, shortness of breath and wheezing.   Cardiovascular: Negative.  Negative for chest pain, palpitations and leg swelling.  Gastrointestinal: Negative.  Negative for abdominal pain, blood in stool, diarrhea, nausea and vomiting.  Genitourinary: Negative.  Negative for dysuria.  Musculoskeletal: Negative.  Negative for back pain.  Skin: Negative.   Neurological: Negative for dizziness, tremors, speech change, focal weakness, seizures and headaches.  Endo/Heme/Allergies: Negative.  Does not bruise/bleed easily.  Psychiatric/Behavioral: Positive for memory loss. Negative for depression, hallucinations and suicidal ideas.     PHYSICAL EXAMINATION:  GENERAL:  78 y.o.-year-old patient lying in the bed with no acute distress.  NECK:  Supple, no jugular venous distention. No thyroid enlargement, no tenderness.  LUNGS: Normal breath sounds bilaterally, no wheezing, rales,rhonchi  No use of accessory muscles of respiration.  CARDIOVASCULAR: S1, S2 normal. No murmurs, rubs, or gallops.   ABDOMEN: Soft, non-tender, non-distended. Bowel sounds present. No organomegaly or mass.  EXTREMITIES: No pedal edema, cyanosis, or clubbing.  PSYCHIATRIC: The patient is alert and oriented x name not place/tim SKIN: No obvious rash, lesion, or ulcer.  intact steri strips without active bleeding, surrounding edema, warmth, or erythema   DATA REVIEW:   CBC Recent Labs  Lab 06/25/18 0418  WBC 6.2  HGB 11.6*  HCT 36.6*  PLT 109*    Chemistries  Recent Labs  Lab 06/25/18 0418  NA 138  K 4.4  CL 108  CO2 22  GLUCOSE 87  BUN 50*  CREATININE 1.23   CALCIUM 8.3*  MG 2.2    Cardiac Enzymes Recent Labs  Lab 06/20/18 1605 06/20/18 1942 06/21/18 0200  TROPONINI 0.08* 0.09* 0.09*    Microbiology Results  @MICRORSLT48 @  RADIOLOGY:  Dg Chest Port 1 View  Result Date: 06/24/2018 CLINICAL DATA:  Status post pacemaker placement EXAM: PORTABLE CHEST 1 VIEW COMPARISON:  06/20/2018 FINDINGS: Cardiac shadow remains enlarged. Postsurgical changes are again seen. New single lead pacemaker is noted in satisfactory position. No pneumothorax is seen. Mild vascular congestion is noted centrally as well as some atelectatic changes in the left retrocardiac region. No bony abnormality is seen. IMPRESSION: No evidence of pneumothorax. Mild left retrocardiac atelectasis and vascular congestion. Electronically Signed   By: Inez Catalina M.D.   On: 06/24/2018 16:16   Dg C-arm 1-60 Min-no Report  Result Date: 06/24/2018 Fluoroscopy was utilized by the requesting physician.  No radiographic interpretation.      Allergies as of 06/26/2018      Reactions  Amiodarone    Other reaction(s): Dizziness   Donepezil Palpitations   Severe bradycardia      Medication List    STOP taking these medications   furosemide 40 MG tablet Commonly known as:  LASIX   spironolactone 25 MG tablet Commonly known as:  ALDACTONE   warfarin 2 MG tablet Commonly known as:  COUMADIN     TAKE these medications   apixaban 5 MG Tabs tablet Commonly known as:  ELIQUIS Take 1 tablet (5 mg total) by mouth 2 (two) times daily.   cephALEXin 250 MG capsule Commonly known as:  KEFLEX Take 1 capsule (250 mg total) by mouth 4 (four) times daily for 7 days.   [employer]S ZINC OXIDE 20 % ointment Generic drug:  zinc oxide Apply 1 application topically 2 (two) times daily as needed for irritation.   ferrous sulfate 325 (65 FE) MG tablet Take 325 mg by mouth 2 (two) times daily with a meal.   FLUoxetine 20 MG capsule Commonly known as:  PROZAC Take 20 mg by mouth daily.    Melatonin 3 MG Tabs Take 1 tablet by mouth at bedtime.   nitroGLYCERIN 0.4 MG SL tablet Commonly known as:  NITROSTAT Place 0.4 mg under the tongue every 5 (five) minutes as needed for chest pain (Up to 3 doses).   omeprazole 20 MG capsule Commonly known as:  PRILOSEC Take 20 mg by mouth daily as needed (Heartburn or acid reflux).   OYSTER SHELL CALCIUM 500 + D 500-125 MG-UNIT Tabs Generic drug:  Calcium Carbonate-Vitamin D Take 1 tablet by mouth daily.   pravastatin 40 MG tablet Commonly known as:  PRAVACHOL Take 1 tablet by mouth at bedtime.          Management plans discussed with the patient and he is in agreement. Stable for discharge home with [facility]  Patient should follow up with cardiology  CODE STATUS:     Code Status Orders  (From admission, onward)         Start     Ordered   06/24/18 1707  Do not attempt resuscitation (DNR)  Continuous    Question Answer Comment  In the event of cardiac or respiratory ARREST Do not call a "code blue"   In the event of cardiac or respiratory ARREST Do not perform Intubation, CPR, defibrillation or ACLS   In the event of cardiac or respiratory ARREST Use medication by any route, position, wound care, and other measures to relive pain and suffering. May use oxygen, suction and manual treatment of airway obstruction as needed for comfort.      06/24/18 1706        Code Status History    Date Active Date Inactive Code Status Order ID Comments User Context   06/20/2018 1415 06/24/2018 1706 DNR [id number]  Bettey Costa, MD ED   06/20/2018 1357 06/20/2018 1415 Full Code [id number]  Bettey Costa, MD ED   01/10/2018 1805 01/12/2018 2138 DNR [id number]  Bettey Costa, MD ED   11/10/2017 1705 11/11/2017 1750 DNR [id number]  Hillary Bow, MD ED   09/03/2017 0617 09/04/2017 1600 Full Code [id number]  Harrie Foreman, MD Inpatient   04/17/2017 1310 04/19/2017 1711 Full Code [id number]  Epifanio Lesches, MD ED      TOTAL TIME TAKING CARE OF  THIS PATIENT: 38 minutes.    Note: This dictation was prepared with Dragon dictation along with smaller phrase technology. Any transcriptional errors that result from this process are unintentional.  Kym Scannell  M.D on 06/26/2018 at 12:36 PM  Between 7am to 6pm - Pager - [phone] After 6pm go to [email or web address] - password [email or web address] [facility]  Office  [phone]  CC: Primary care physician; Rusty Aus, MD

## 2018-06-26 NOTE — Evaluation (Signed)
Occupational Therapy Evaluation Patient Details Name: Kyle Gregory MRN: [id number] DOB: [birth year] Today's Date: 06/26/2018    History of Present Illness From MD H&P:  Pt is a 78 y.o. male with a known history of chronic atrial fibrillation/aortic valve replacement with bioprosthetic valve on anticoagulation, COPD, CAD status post CABG and dilated cardiomyopathy.  Pt presented from home due to shortness of breath.  Pt was noted to have bradycardia with heart rates in the 20s to 30s.  He was given atropine.  Pt is now s/p temp pacemaker placement.  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   Clinical Impression   Pt seen for OT evaluation this date. Prior to hospital admission, pt was living at home with his spouse who provided 24/7 sup/assist 2/2 pt's cognitive impairments. Per previous chart review (since no family present at time of evaluation), pt was ambulating with [facility] and received assist from spouse for seated bathing. Currently pt demonstrates impairments in recall, safety awareness, knowledge of precautions and ability to retain and carryover, strength, and balance requiring Min-Mod assist for LB ADL, CGA for functional transfers/mobility for ADL using RW, and min-mod verbal cues to maximize safety and adherence for LUE precautions. Pt would benefit from skilled OT to address noted impairments and functional limitations (see below for any additional details) in order to maximize safety and independence while minimizing falls risk and caregiver burden. Upon hospital discharge, recommend pt discharge to home with [facility] services and continued 24/7 supervision/assist.    Follow Up Recommendations  Home health OT;Supervision/Assistance - 24 hour    Equipment Recommendations  Toilet riser    Recommendations for Other Services       Precautions / Restrictions Precautions Precautions: ICD/Pacemaker Precaution Comments: No lifting >15 lbs, no lifting LUE over head for 6 wks Restrictions Weight Bearing Restrictions: No      Mobility Bed Mobility             General bed mobility comments: deferred, up in recliner at start/end of session  Transfers Overall transfer level: Needs assistance Equipment used: Rolling walker (2 wheeled) Transfers: Sit to/from Stand Sit to Stand: From elevated surface;Min guard         General transfer comment: Mod verbal cues for sequencing most notably for hand placement    Balance Overall balance assessment: Mild deficits observed, not formally tested                                         ADL either performed or assessed with clinical judgement   ADL Overall ADL's : Needs assistance/impaired Eating/Feeding: Sitting;Supervision/ safety;Set up   Grooming: Sitting;Set up;Supervision/safety   Upper Body Bathing: Sitting;Minimal assistance   Lower Body Bathing: Sit to/from stand;Moderate assistance;Minimal assistance   Upper Body Dressing : Sitting;Minimal assistance   Lower Body Dressing: Sit to/from stand;Minimal assistance;Moderate assistance   Toilet Transfer: Regular Toilet;Min guard;Cueing for sequencing;Cueing for safety;RW;Ambulation Toilet Transfer Details (indicate cue type and reason): VC for LUE precautions, positioning, hand placement Toileting- Clothing Manipulation and Hygiene: Sit to/from stand;Moderate assistance       Functional mobility during ADLs: Min guard;Cueing for safety;Cueing for sequencing;Rolling walker       Vision Patient Visual Report: No change from baseline  Perception     Praxis      Pertinent Vitals/Pain Pain Assessment: Faces Faces Pain Scale: No hurt Pain Intervention(s): Limited activity within patient's tolerance;Monitored during  session     Hand Dominance Right   Extremity/Trunk Assessment Upper Extremity Assessment Upper Extremity Assessment: RUE deficits/detail;LUE deficits/detail RUE Deficits / Details: grossly 4/5 LUE Deficits / Details: Limited by pacemaker precautions, grip 4/5   Lower Extremity Assessment Lower Extremity Assessment: Generalized weakness   Cervical / Trunk Assessment Cervical / Trunk Assessment: Normal   Communication Communication Communication: No difficulties   Cognition Arousal/Alertness: Awake/alert Behavior During Therapy: WFL for tasks assessed/performed Overall Cognitive Status: No family/caregiver present to determine baseline cognitive functioning                                 General Comments: pt pleasant, follows simple commands with min-mod verbal/tactile cues, alert and oriented to self only   General Comments       Exercises    Shoulder Instructions      Home Living Family/patient expects to be discharged to:: Private residence(History from chart review with pt a poor historian and no family available to assist) Living Arrangements: Spouse/significant other Available Help at Discharge: Family;Available 24 hours/day(per nurse tech/sitter, spouse reported she has someone come help pt for a couple hours so spouse can run errands) Type of Home: House Home Access: Ramped entrance     [place]: Two level;Able to live on main level with bedroom/bathroom     Bathroom Shower/Tub: Occupational psychologist: Standard     Home Equipment: Clinical cytogeneticist - 2 wheels;Wheelchair - manual;Cane - single point          Prior Functioning/Environment Level of Independence: Needs assistance  Gait / Transfers Assistance Needed: Pt instructed to amb with a RW but compliance limited by cognition ADL's / Homemaking Assistance Needed: Pt has a shower bench which he sits on and wife assists with showers occasionally            OT Problem  List: Decreased strength;Decreased range of motion;Decreased knowledge of precautions;Decreased knowledge of use of DME or AE;Decreased cognition;Cardiopulmonary status limiting activity;Impaired UE functional use;Impaired balance (sitting and/or standing);Decreased safety awareness      OT Treatment/Interventions: Self-care/ADL training;Balance training;Therapeutic exercise;Therapeutic activities;Cognitive remediation/compensation;DME and/or AE instruction;Patient/family education    OT Goals(Current goals can be found in the care plan section) Acute Rehab OT Goals Patient Stated Goal: pt unable to state OT Goal Formulation: Patient unable to participate in goal setting ADL Goals Pt Will Perform Upper Body Dressing: with supervision;with set-up;sitting(maintaining LUE precautions, VC PRN for safety) Pt Will Transfer to Toilet: with supervision;ambulating(LRAD for amb, comfort height toilet, maintaining LUE precautions, VC PRN for safety) Additional ADL Goal #1: Spouse will verbalize 100% of LUE precautions and how to assist pt in maintaining during ADL tasks to maximize adherence to post-pacemaker implantation precautions.  OT Frequency: Min 1X/week   Barriers to D/C:            Co-evaluation              AM-PAC OT "6 Clicks" Daily Activity     Outcome Measure Help from another person eating meals?: A Little Help from another person taking care of personal grooming?: A Little Help from another person toileting, which includes using toliet, bedpan, or urinal?: A Little Help from another person bathing (including washing, rinsing, drying)?: A Lot Help from  another person to put on and taking off regular upper body clothing?: A Little Help from another person to put on and taking off regular lower body clothing?: A Lot 6 Click Score: 16   End of Session Equipment Utilized During Treatment: Gait belt;Rolling walker  Activity Tolerance: Patient tolerated treatment well Patient  left: Other (comment)(seated on [facility] over toilet with nurse tech to complete toileting task)  OT Visit Diagnosis: Other abnormalities of gait and mobility (R26.89);Muscle weakness (generalized) (M62.81)                Time: 0350-0938 OT Time Calculation (min): 18 min Charges:  OT General Charges $OT Visit: 1 Visit OT Evaluation $OT Eval Low Complexity: 1 Low OT Treatments $Self Care/Home Management : 8-22 mins  Jeni Salles, MPH, MS, OTR/L ascom [phone] 06/26/18, 10:34 AM

## 2018-06-26 NOTE — Care Management (Signed)
Discharge to home today per Dr. Benjie Karvonen.  Sharmon Revere, [facility] representative updated Shelbie Ammons RN MSN CCM Care Management [phone]

## 2018-07-04 ENCOUNTER — Telehealth: Payer: Self-pay | Admitting: Urology

## 2018-07-04 NOTE — Telephone Encounter (Signed)
Pt's wife called and requests a call back to explain what all is involved in the BCG that Mr Renaldo Fiddler is getting next week. Please advise

## 2018-07-08 DIAGNOSIS — Z950 Presence of cardiac pacemaker: Secondary | ICD-10-CM | POA: Insufficient documentation

## 2018-07-08 NOTE — Telephone Encounter (Signed)
mychart sent.

## 2018-07-10 ENCOUNTER — Telehealth: Payer: Self-pay | Admitting: Urology

## 2018-07-10 DIAGNOSIS — N390 Urinary tract infection, site not specified: Secondary | ICD-10-CM

## 2018-07-10 NOTE — Telephone Encounter (Signed)
Patient's wife states that he has had a pacemaker placed on 02/24 and he is still very weak.  She is unable to get him out of bed, but she would like to bring in an UA as he is still very confused.  We will go ahead and push his start date back two weeks to give him time to get stronger.  Would you please reschedule his BCG treatments (put them two weeks out)?  I have placed orders for an UA and culture for him.

## 2018-07-10 NOTE — Addendum Note (Signed)
Addended by: Zara Council A on: 07/10/2018 05:09 PM   Modules accepted: Orders

## 2018-07-10 NOTE — Telephone Encounter (Signed)
Pt's wife called and doesn't think he's going to be able to begin BCG tomorrow.  She wants to speak with someone about this.

## 2018-07-11 ENCOUNTER — Other Ambulatory Visit: Payer: Medicare Other

## 2018-07-11 ENCOUNTER — Ambulatory Visit: Payer: Self-pay | Admitting: Urology

## 2018-07-11 ENCOUNTER — Other Ambulatory Visit: Payer: Self-pay

## 2018-07-11 DIAGNOSIS — N390 Urinary tract infection, site not specified: Secondary | ICD-10-CM

## 2018-07-11 LAB — MICROSCOPIC EXAMINATION

## 2018-07-11 LAB — URINALYSIS, COMPLETE
Bilirubin, UA: NEGATIVE
Glucose, UA: NEGATIVE
Ketones, UA: NEGATIVE
Leukocytes, UA: NEGATIVE
Nitrite, UA: NEGATIVE
Protein, UA: NEGATIVE
Specific Gravity, UA: 1.02 (ref 1.005–1.030)
Urobilinogen, Ur: >8 mg/dL — ABNORMAL HIGH (ref 0.2–1.0)
pH, UA: 6.5 (ref 5.0–7.5)

## 2018-07-12 NOTE — Telephone Encounter (Signed)
Kyle Gregory, please review BCG treatments below. Thank you.

## 2018-07-13 LAB — URINE CULTURE

## 2018-07-15 ENCOUNTER — Telehealth: Payer: Self-pay

## 2018-07-15 MED ORDER — NITROFURANTOIN MONOHYD MACRO 100 MG PO CAPS: 100.0000 mg | ORAL_CAPSULE | Freq: Two times a day (BID) | ORAL | 0 refills | Status: DC

## 2018-07-15 NOTE — Telephone Encounter (Signed)
-----   Message from Nori Riis, PA-C sent at 07/15/2018  8:08 AM EDT ----- Please let Mr. Spagnoli know that his urine culture was positive for infection.  He needs to start Macrobid 100 mg, BID x seven days.

## 2018-07-15 NOTE — Telephone Encounter (Signed)
-----   Message from Nori Riis, PA-C sent at 07/15/2018  8:08 AM EDT ----- Please let Mr. Kyle Gregory know that his urine culture was positive for infection.  He needs to start Macrobid 100 mg, BID x seven days.

## 2018-07-15 NOTE — Telephone Encounter (Signed)
Script and mychart sent 

## 2018-07-18 ENCOUNTER — Telehealth: Payer: Self-pay

## 2018-07-18 ENCOUNTER — Ambulatory Visit: Payer: Self-pay | Admitting: Urology

## 2018-07-18 NOTE — Telephone Encounter (Signed)
Left patient message to discuss cancellation of BCG appointments have been cancelled

## 2018-07-25 ENCOUNTER — Ambulatory Visit: Payer: Self-pay | Admitting: Urology

## 2018-08-28 ENCOUNTER — Telehealth: Payer: Self-pay | Admitting: Urology

## 2018-08-28 NOTE — Telephone Encounter (Signed)
Error

## 2018-09-05 ENCOUNTER — Other Ambulatory Visit: Payer: Self-pay | Admitting: Urology

## 2018-10-17 ENCOUNTER — Encounter: Payer: Self-pay | Admitting: Urology

## 2018-10-17 ENCOUNTER — Ambulatory Visit (INDEPENDENT_AMBULATORY_CARE_PROVIDER_SITE_OTHER): Payer: Medicare Other | Admitting: Urology

## 2018-10-17 ENCOUNTER — Other Ambulatory Visit: Payer: Self-pay

## 2018-10-17 ENCOUNTER — Telehealth: Payer: Self-pay | Admitting: Radiology

## 2018-10-17 ENCOUNTER — Other Ambulatory Visit: Payer: Self-pay | Admitting: Radiology

## 2018-10-17 VITALS — BP 103/66 | HR 72 | Ht 72.0 in | Wt 192.2 lb

## 2018-10-17 DIAGNOSIS — C679 Malignant neoplasm of bladder, unspecified: Secondary | ICD-10-CM | POA: Diagnosis not present

## 2018-10-17 LAB — URINALYSIS, COMPLETE
Bilirubin, UA: NEGATIVE
Glucose, UA: NEGATIVE
Ketones, UA: NEGATIVE
Leukocytes,UA: NEGATIVE
Nitrite, UA: NEGATIVE
Protein,UA: NEGATIVE
Specific Gravity, UA: 1.015 (ref 1.005–1.030)
Urobilinogen, Ur: 0.2 mg/dL (ref 0.2–1.0)
pH, UA: 5.5 (ref 5.0–7.5)

## 2018-10-17 LAB — MICROSCOPIC EXAMINATION: Bacteria, UA: NONE SEEN

## 2018-10-17 NOTE — Progress Notes (Signed)
Cystoscopy Procedure Note:  Indication: Hx of HG bladder cancer  Prior treatments: 07/2016: TURBT 3cm HG Ta and CIS BCG induction 3/6 treatments Lost to follow up 12/2017 TURBT 2cm HG Ta (only 10% of tumor HG, primarily LG), no invasion Induction gemcitabine 03/2018 (BCG shortage)  After informed consent and discussion of the procedure and its risks, Kyle Gregory was positioned and prepped in the standard fashion. Cystoscopy was performed with a flexible cystoscope. The urethra, bladder neck and entire bladder was visualized in a standard fashion. The prostate was moderate in size. ~2cm papillary tumor at the anterior bladder wall, as well as a few other small <1cm papillary satellite lesions.  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

## 2018-10-17 NOTE — Telephone Encounter (Signed)
Patient was given the [facility] Information form below as well as the Instructions for Pre-Admission Testing form & a map of [facility].    [address] [place], [place] [address] Telephone: [phone] Fax: [phone]   Thank you for choosing [facility] for your upcoming surgery!  We are always here to assist in your urological needs.  Please read the following information with specific details for your upcoming appointments related to your surgery. Please contact [facility] at [phone] Option 3 with any questions.  The Name of Your Surgery: Transurethral resection of bladder tumor Your Surgery Date: 11/06/2018 Your Surgeon: Nickolas Madrid  Please call Same Day Surgery at [phone] between the hours of 1pm-3pm one day prior to your surgery. They will inform you of the time to arrive at Same Day Surgery which is located on the second floor of the [facility].   Please refer to the attached letter regarding instructions for Pre-Admission Testing. You will receive a call from the [place] office regarding your appointment with them.  The Pre-Admission Testing office is located at [place], on the first floor of the [place] [facility] in [place] (office is to the right as you enter through the [employer] of the [facility]). Please have all medications you are currently taking and your insurance card available.  A COVID-19 test will be required prior to surgery and once test is performed you will need to remain in quarantine until the day of surgery. Patient was advised to have nothing to eat or drink after midnight the night prior to surgery except that he may have only water until 2 hours before surgery with nothing to drink within 2 hours of surgery.  The patient states he currently takes Eliquis & will be  informed when to hold medication once clearance is obtained from his cardiologist. Patient's questions were answered and he expressed understanding of these instructions.

## 2018-10-25 ENCOUNTER — Other Ambulatory Visit: Payer: Self-pay

## 2018-10-25 ENCOUNTER — Ambulatory Visit: Payer: Self-pay

## 2018-10-25 ENCOUNTER — Other Ambulatory Visit: Payer: Medicare Other

## 2018-10-25 DIAGNOSIS — C679 Malignant neoplasm of bladder, unspecified: Secondary | ICD-10-CM

## 2018-10-25 LAB — URINALYSIS, COMPLETE
Bilirubin, UA: NEGATIVE
Glucose, UA: NEGATIVE
Ketones, UA: NEGATIVE
Leukocytes,UA: NEGATIVE
Nitrite, UA: NEGATIVE
Protein,UA: NEGATIVE
Specific Gravity, UA: 1.02 (ref 1.005–1.030)
Urobilinogen, Ur: 1 mg/dL (ref 0.2–1.0)
pH, UA: 6 (ref 5.0–7.5)

## 2018-10-25 LAB — MICROSCOPIC EXAMINATION: Bacteria, UA: NONE SEEN

## 2018-10-28 LAB — CULTURE, URINE COMPREHENSIVE

## 2018-10-31 ENCOUNTER — Ambulatory Visit: Payer: Self-pay

## 2018-11-01 ENCOUNTER — Other Ambulatory Visit: Payer: Self-pay

## 2018-11-01 ENCOUNTER — Encounter
Admission: RE | Admit: 2018-11-01 | Discharge: 2018-11-01 | Disposition: A | Discharge status: Home / Self Care | Payer: Medicare Other | Source: Ambulatory Visit | Attending: Urology | Admitting: Urology

## 2018-11-01 DIAGNOSIS — Z01812 Encounter for preprocedural laboratory examination: Secondary | ICD-10-CM | POA: Diagnosis not present

## 2018-11-01 DIAGNOSIS — Z1159 Encounter for screening for other viral diseases: Secondary | ICD-10-CM | POA: Diagnosis not present

## 2018-11-01 DIAGNOSIS — C679 Malignant neoplasm of bladder, unspecified: Secondary | ICD-10-CM | POA: Insufficient documentation

## 2018-11-01 HISTORY — DX: Cardiac arrhythmia, unspecified: I49.9

## 2018-11-01 HISTORY — DX: Gastro-esophageal reflux disease without esophagitis: K21.9

## 2018-11-01 HISTORY — DX: Atherosclerotic heart disease of native coronary artery without angina pectoris: I25.10

## 2018-11-01 HISTORY — DX: Other skin changes: R23.8

## 2018-11-01 HISTORY — DX: Hyperlipidemia, unspecified: E78.5

## 2018-11-01 HISTORY — DX: Presence of cardiac pacemaker: Z95.0

## 2018-11-01 HISTORY — DX: Localized edema: R60.0

## 2018-11-01 HISTORY — DX: Unspecified dementia, unspecified severity, without behavioral disturbance, psychotic disturbance, mood disturbance, and anxiety: F03.90

## 2018-11-01 LAB — BASIC METABOLIC PANEL
Anion gap: 8 (ref 5–15)
BUN: 23 mg/dL (ref 8–23)
CO2: 24 mmol/L (ref 22–32)
Calcium: 8.9 mg/dL (ref 8.9–10.3)
Chloride: 104 mmol/L (ref 98–111)
Creatinine, Ser: 1.05 mg/dL (ref 0.61–1.24)
GFR calc Af Amer: >60 mL/min (ref >60)
GFR calc non Af Amer: >60 mL/min (ref >60)
Glucose, Bld: 92 mg/dL (ref 70–99)
Potassium: 4.6 mmol/L (ref 3.5–5.1)
Sodium: 136 mmol/L (ref 135–145)

## 2018-11-01 LAB — CBC
HCT: 43.4 % (ref 39.0–52.0)
Hemoglobin: 14.2 g/dL (ref 13.0–17.0)
MCH: 31.5 pg (ref 26.0–34.0)
MCHC: 32.7 g/dL (ref 30.0–36.0)
MCV: 96.2 fL (ref 80.0–100.0)
Platelets: 124 10*3/uL — ABNORMAL LOW (ref 150–400)
RBC: 4.51 MIL/uL (ref 4.22–5.81)
RDW: 13.9 % (ref 11.5–15.5)
WBC: 7.3 10*3/uL (ref 4.0–10.5)
nRBC: 0 % (ref 0.0–0.2)

## 2018-11-01 NOTE — Patient Instructions (Signed)
Your procedure is scheduled on: 11/06/2018 Wed Report to Same Day Surgery 2nd floor medical mall [facility] Entrance-take elevator on left to 2nd floor.  Check in with surgery information desk.) To find out your arrival time please call [phone] between 1PM - 3PM on 11/05/2018 Tues  Remember: Instructions that are not followed completely may result in serious medical risk, up to and including death, or upon the discretion of your surgeon and anesthesiologist your surgery may need to be rescheduled.    _x___ 1. Do not eat food after midnight the night before your procedure. You may drink clear liquids up to 2 hours before you are scheduled to arrive at the hospital for your procedure.  Do not drink clear liquids within 2 hours of your scheduled arrival to the hospital.  Clear liquids include  --Water or Apple juice without pulp  --Clear carbohydrate beverage such as ClearFast or Gatorade  --Black Coffee or Clear Tea (No milk, no creamers, do not add anything to                  the coffee or Tea Type 1 and type 2 diabetics should only drink water.   ____Ensure clear carbohydrate drink on the way to the hospital for bariatric patients  ____Ensure clear carbohydrate drink 3 hours before surgery for Dr Dwyane Luo patients if physician instructed.   No gum chewing or hard candies.     __x__ 2. No Alcohol for 24 hours before or after surgery.   __x__3. No Smoking or e-cigarettes for 24 prior to surgery.  Do not use any chewable tobacco products for at least 6 hour prior to surgery   ____  4. Bring all medications with you on the day of surgery if instructed.    __x__ 5. Notify your doctor if there is any change in your medical condition     (cold, fever, infections).    x___6. On the morning of surgery brush your teeth with toothpaste and water.  You may rinse your mouth with mouth wash if you wish.  Do not swallow any toothpaste or mouthwash.   Do not wear jewelry, make-up, hairpins,  clips or nail polish.  Do not wear lotions, powders, or perfumes. You may wear deodorant.  Do not shave 48 hours prior to surgery. Men may shave face and neck.  Do not bring valuables to the hospital.    [facility] is not responsible for any belongings or valuables.               Contacts, dentures or bridgework may not be worn into surgery.  Leave your suitcase in the car. After surgery it may be brought to your room.  For patients admitted to the hospital, discharge time is determined by your                       treatment team.  _  Patients discharged the day of surgery will not be allowed to drive home.  You will need someone to drive you home and stay with you the night of your procedure.    Please read over the following fact sheets that you were given:   [facility] Preparing for Surgery and or MRSA Information   _x___ Take anti-hypertensive listed below, cardiac, seizure, asthma,     anti-reflux and psychiatric medicines. These include:  1. FLUoxetine (PROZAC) 20 MG capsule  2.omeprazole (PRILOSEC) 20 MG capsule the night before and the morning of surgery  3.  4.  5.  6.  ____Fleets enema or Magnesium Citrate as directed.   ____ Use CHG Soap or sage wipes as directed on instruction sheet   ____ Use inhalers on the day of surgery and bring to hospital day of surgery  ____ Stop Metformin and Janumet 2 days prior to surgery.    ____ Take 1/2 of usual insulin dose the night before surgery and none on the morning     surgery.   _x___ Follow recommendations from Cardiologist, Pulmonologist or PCP regarding          stopping Aspirin, Coumadin, Plavix ,Eliquis, Effient, or Pradaxa, and Pletal.  X____Stop Anti-inflammatories such as Advil, Aleve, Ibuprofen, Motrin, Naproxen, Naprosyn, Goodies powders or aspirin products. OK to take Tylenol and                          Celebrex.   _x___ Stop supplements until after surgery.  But may continue Vitamin D, Vitamin B,       and  multivitamin.   ____ Bring C-Pap to the hospital.

## 2018-11-02 LAB — SARS CORONAVIRUS 2 (TAT 6-24 HRS): SARS Coronavirus 2: NEGATIVE

## 2018-11-04 ENCOUNTER — Ambulatory Visit: Payer: Self-pay

## 2018-11-05 MED ORDER — CEFAZOLIN SODIUM-DEXTROSE 2-4 GM/100ML-% IV SOLN
2.0000 g | INTRAVENOUS | Status: AC
  Administered 2018-11-06: 2 g via INTRAVENOUS

## 2018-11-06 ENCOUNTER — Other Ambulatory Visit: Payer: Self-pay | Admitting: Urology

## 2018-11-06 ENCOUNTER — Ambulatory Visit: Payer: Medicare Other | Admitting: Anesthesiology

## 2018-11-06 ENCOUNTER — Ambulatory Visit
Admission: RE | Admit: 2018-11-06 | Discharge: 2018-11-06 | Disposition: A | Discharge status: Home / Self Care | Payer: Medicare Other | Attending: Urology | Admitting: Urology

## 2018-11-06 ENCOUNTER — Other Ambulatory Visit: Payer: Self-pay

## 2018-11-06 ENCOUNTER — Encounter
Admission: RE | Disposition: A | Discharge status: Home / Self Care | Payer: Self-pay | Source: Home / Self Care | Attending: Urology

## 2018-11-06 ENCOUNTER — Encounter: Payer: Self-pay | Admitting: Emergency Medicine

## 2018-11-06 DIAGNOSIS — C678 Malignant neoplasm of overlapping sites of bladder: Secondary | ICD-10-CM | POA: Diagnosis not present

## 2018-11-06 DIAGNOSIS — I4891 Unspecified atrial fibrillation: Secondary | ICD-10-CM | POA: Diagnosis not present

## 2018-11-06 DIAGNOSIS — J449 Chronic obstructive pulmonary disease, unspecified: Secondary | ICD-10-CM | POA: Insufficient documentation

## 2018-11-06 DIAGNOSIS — I2510 Atherosclerotic heart disease of native coronary artery without angina pectoris: Secondary | ICD-10-CM | POA: Insufficient documentation

## 2018-11-06 DIAGNOSIS — I509 Heart failure, unspecified: Secondary | ICD-10-CM | POA: Insufficient documentation

## 2018-11-06 DIAGNOSIS — Z951 Presence of aortocoronary bypass graft: Secondary | ICD-10-CM | POA: Insufficient documentation

## 2018-11-06 DIAGNOSIS — D494 Neoplasm of unspecified behavior of bladder: Secondary | ICD-10-CM

## 2018-11-06 DIAGNOSIS — F0390 Unspecified dementia without behavioral disturbance: Secondary | ICD-10-CM | POA: Insufficient documentation

## 2018-11-06 DIAGNOSIS — C679 Malignant neoplasm of bladder, unspecified: Secondary | ICD-10-CM

## 2018-11-06 DIAGNOSIS — Z7901 Long term (current) use of anticoagulants: Secondary | ICD-10-CM | POA: Diagnosis not present

## 2018-11-06 DIAGNOSIS — K219 Gastro-esophageal reflux disease without esophagitis: Secondary | ICD-10-CM | POA: Diagnosis not present

## 2018-11-06 DIAGNOSIS — Z8673 Personal history of transient ischemic attack (TIA), and cerebral infarction without residual deficits: Secondary | ICD-10-CM | POA: Diagnosis not present

## 2018-11-06 DIAGNOSIS — Z79899 Other long term (current) drug therapy: Secondary | ICD-10-CM | POA: Diagnosis not present

## 2018-11-06 HISTORY — PX: TRANSURETHRAL RESECTION OF BLADDER TUMOR: SHX2575

## 2018-11-06 SURGERY — TURBT (TRANSURETHRAL RESECTION OF BLADDER TUMOR)
Anesthesia: General | Site: Bladder

## 2018-11-06 MED ORDER — PHENYLEPHRINE HCL (PRESSORS) 10 MG/ML IV SOLN
INTRAVENOUS | Status: DC | PRN
  Administered 2018-11-06 (×2): 100 ug via INTRAVENOUS

## 2018-11-06 MED ORDER — ROCURONIUM BROMIDE 100 MG/10ML IV SOLN
INTRAVENOUS | Status: DC | PRN
  Administered 2018-11-06: 20 mg via INTRAVENOUS
  Administered 2018-11-06: 30 mg via INTRAVENOUS

## 2018-11-06 MED ORDER — ROCURONIUM BROMIDE 50 MG/5ML IV SOLN
INTRAVENOUS | Status: AC
  Filled 2018-11-06: qty 1

## 2018-11-06 MED ORDER — ONDANSETRON HCL 4 MG/2ML IJ SOLN: 4.0000 mg | Freq: Once | INTRAMUSCULAR | Status: DC | PRN

## 2018-11-06 MED ORDER — FENTANYL CITRATE (PF) 100 MCG/2ML IJ SOLN
INTRAMUSCULAR | Status: DC | PRN
  Administered 2018-11-06: 50 ug via INTRAVENOUS

## 2018-11-06 MED ORDER — PROPOFOL 10 MG/ML IV BOLUS
INTRAVENOUS | Status: DC | PRN
  Administered 2018-11-06: 50 mg via INTRAVENOUS
  Administered 2018-11-06: 20 mg via INTRAVENOUS

## 2018-11-06 MED ORDER — ONDANSETRON HCL 4 MG/2ML IJ SOLN
INTRAMUSCULAR | Status: DC | PRN
  Administered 2018-11-06: 4 mg via INTRAVENOUS

## 2018-11-06 MED ORDER — LIDOCAINE HCL (CARDIAC) PF 100 MG/5ML IV SOSY
PREFILLED_SYRINGE | INTRAVENOUS | Status: DC | PRN
  Administered 2018-11-06: 100 mg via INTRAVENOUS

## 2018-11-06 MED ORDER — ONDANSETRON HCL 4 MG/2ML IJ SOLN
INTRAMUSCULAR | Status: AC
  Filled 2018-11-06: qty 2

## 2018-11-06 MED ORDER — LIDOCAINE HCL (PF) 2 % IJ SOLN
INTRAMUSCULAR | Status: AC
  Filled 2018-11-06: qty 10

## 2018-11-06 MED ORDER — ACETAMINOPHEN 10 MG/ML IV SOLN
INTRAVENOUS | Status: DC | PRN
  Administered 2018-11-06: 1000 mg via INTRAVENOUS

## 2018-11-06 MED ORDER — CEFAZOLIN SODIUM-DEXTROSE 2-4 GM/100ML-% IV SOLN
INTRAVENOUS | Status: AC
  Filled 2018-11-06: qty 100

## 2018-11-06 MED ORDER — PROPOFOL 10 MG/ML IV BOLUS
INTRAVENOUS | Status: AC
  Filled 2018-11-06: qty 20

## 2018-11-06 MED ORDER — FENTANYL CITRATE (PF) 100 MCG/2ML IJ SOLN: 25.0000 ug | INTRAMUSCULAR | Status: DC | PRN

## 2018-11-06 MED ORDER — DIPHENHYDRAMINE HCL 50 MG/ML IJ SOLN
INTRAMUSCULAR | Status: AC
  Administered 2018-11-06: 14:00:00 12.5 mg via INTRAVENOUS
  Filled 2018-11-06: qty 1

## 2018-11-06 MED ORDER — SUGAMMADEX SODIUM 200 MG/2ML IV SOLN
INTRAVENOUS | Status: DC | PRN
  Administered 2018-11-06: 200 mg via INTRAVENOUS

## 2018-11-06 MED ORDER — SUGAMMADEX SODIUM 200 MG/2ML IV SOLN
INTRAVENOUS | Status: AC
  Filled 2018-11-06: qty 2

## 2018-11-06 MED ORDER — ACETAMINOPHEN 10 MG/ML IV SOLN
INTRAVENOUS | Status: AC
  Filled 2018-11-06: qty 100

## 2018-11-06 MED ORDER — DEXAMETHASONE SODIUM PHOSPHATE 10 MG/ML IJ SOLN
INTRAMUSCULAR | Status: AC
  Filled 2018-11-06: qty 1

## 2018-11-06 MED ORDER — FENTANYL CITRATE (PF) 100 MCG/2ML IJ SOLN
INTRAMUSCULAR | Status: AC
  Filled 2018-11-06: qty 2

## 2018-11-06 MED ORDER — DIPHENHYDRAMINE HCL 50 MG/ML IJ SOLN
12.5000 mg | Freq: Once | INTRAMUSCULAR | Status: AC
  Administered 2018-11-06: 14:00:00 12.5 mg via INTRAVENOUS

## 2018-11-06 MED ORDER — SUCCINYLCHOLINE CHLORIDE 20 MG/ML IJ SOLN
INTRAMUSCULAR | Status: AC
  Filled 2018-11-06: qty 1

## 2018-11-06 MED ORDER — DEXAMETHASONE SODIUM PHOSPHATE 10 MG/ML IJ SOLN
INTRAMUSCULAR | Status: DC | PRN
  Administered 2018-11-06: 8 mg via INTRAVENOUS

## 2018-11-06 MED ORDER — LACTATED RINGERS IV SOLN
INTRAVENOUS | Status: DC
  Administered 2018-11-06 (×2): via INTRAVENOUS

## 2018-11-06 SURGICAL SUPPLY — 27 items
BAG DRAIN CYSTO-URO LG1000N (MISCELLANEOUS) ×3 IMPLANT
BAG URINE DRAINAGE (UROLOGICAL SUPPLIES) ×3 IMPLANT
BRUSH SCRUB EZ  4% CHG (MISCELLANEOUS) ×2
BRUSH SCRUB EZ 4% CHG (MISCELLANEOUS) ×1 IMPLANT
CATH FOLEY 2WAY  5CC 16FR (CATHETERS)
CATH URTH 16FR FL 2W BLN LF (CATHETERS) ×1 IMPLANT
COVER WAND RF STERILE (DRAPES) ×1 IMPLANT
CRADLE LAMINECT ARM (MISCELLANEOUS) ×3 IMPLANT
DRAPE UTILITY 15X26 TOWEL STRL (DRAPES) ×3 IMPLANT
DRSG TELFA 4X3 1S NADH ST (GAUZE/BANDAGES/DRESSINGS) ×3 IMPLANT
ELECT LOOP 22F BIPOLAR SML (ELECTROSURGICAL)
ELECT REM PT RETURN 9FT ADLT (ELECTROSURGICAL) ×3
ELECTRODE LOOP 22F BIPOLAR SML (ELECTROSURGICAL) IMPLANT
ELECTRODE REM PT RTRN 9FT ADLT (ELECTROSURGICAL) IMPLANT
GLOVE BIO SURGEON STRL SZ 6.5 (GLOVE) ×2 IMPLANT
GLOVE BIO SURGEONS STRL SZ 6.5 (GLOVE) ×1
GOWN STRL REUS W/ TWL LRG LVL3 (GOWN DISPOSABLE) ×2 IMPLANT
GOWN STRL REUS W/TWL LRG LVL3 (GOWN DISPOSABLE) ×4
KIT TURNOVER CYSTO (KITS) ×3 IMPLANT
LOOP CUT BIPOLAR 24F LRG (ELECTROSURGICAL) IMPLANT
NDL SAFETY ECLIPSE 18X1.5 (NEEDLE) ×1 IMPLANT
NEEDLE HYPO 18GX1.5 SHARP (NEEDLE)
PACK CYSTO AR (MISCELLANEOUS) ×3 IMPLANT
SET IRRIG Y TYPE TUR BLADDER L (SET/KITS/TRAYS/PACK) ×3 IMPLANT
SURGILUBE 2OZ TUBE FLIPTOP (MISCELLANEOUS) ×3 IMPLANT
SYRINGE IRR TOOMEY STRL 70CC (SYRINGE) ×3 IMPLANT
WATER STERILE IRR 1000ML POUR (IV SOLUTION) ×3 IMPLANT

## 2018-11-06 NOTE — Anesthesia Preprocedure Evaluation (Signed)
Anesthesia Evaluation  Patient identified by MRN, date of birth, ID band Patient awake    Reviewed: Allergy & Precautions, NPO status , Patient's Chart, lab work & pertinent test results, reviewed documented beta blocker date and time   Airway Mallampati: III  TM Distance: >3 FB     Dental  (+) Chipped   Pulmonary COPD,           Cardiovascular + CAD, + CABG and +CHF  + dysrhythmias Atrial Fibrillation + pacemaker      Neuro/Psych PSYCHIATRIC DISORDERS Dementia CVA    GI/Hepatic GERD  Controlled,  Endo/Other    Renal/GU      Musculoskeletal   Abdominal   Peds  Hematology  (+) anemia ,   Anesthesia Other Findings EKG paced. Echo 55-60.  Reproductive/Obstetrics                             Anesthesia Physical Anesthesia Plan  ASA: III  Anesthesia Plan: General   Post-op Pain Management:    Induction: Intravenous  PONV Risk Score and Plan:   Airway Management Planned: Oral ETT  Additional Equipment:   Intra-op Plan:   Post-operative Plan:   Informed Consent: I have reviewed the patients History and Physical, chart, labs and discussed the procedure including the risks, benefits and alternatives for the proposed anesthesia with the patient or authorized representative who has indicated his/her understanding and acceptance.       Plan Discussed with: CRNA  Anesthesia Plan Comments:         Anesthesia Quick Evaluation

## 2018-11-06 NOTE — Anesthesia Procedure Notes (Signed)
Procedure Name: Intubation Date/Time: 11/06/2018 3:10 PM Performed by: Lowry Bowl, CRNA Pre-anesthesia Checklist: Patient identified, Emergency Drugs available, Suction available and Patient being monitored Patient Re-evaluated:Patient Re-evaluated prior to induction Oxygen Delivery Method: Circle system utilized Preoxygenation: Pre-oxygenation with 100% oxygen Induction Type: IV induction, Cricoid Pressure applied and Rapid sequence Ventilation: Mask ventilation without difficulty Laryngoscope Size: Mac and 4 Grade View: Grade I Tube type: Oral Tube size: 7.5 mm Number of attempts: 1 Airway Equipment and Method: Stylet Placement Confirmation: ETT inserted through vocal cords under direct vision,  positive ETCO2 and breath sounds checked- equal and bilateral Secured at: 22 cm Tube secured with: Tape Dental Injury: Teeth and Oropharynx as per pre-operative assessment

## 2018-11-06 NOTE — Anesthesia Post-op Follow-up Note (Signed)
Anesthesia QCDR form completed.        

## 2018-11-06 NOTE — Transfer of Care (Signed)
Immediate Anesthesia Transfer of Care Note  Patient: Kyle Gregory  Procedure(s) Performed: TRANSURETHRAL RESECTION OF BLADDER TUMOR (TURBT) (N/A Bladder)  Patient Location: PACU  Anesthesia Type:General  Level of Consciousness: drowsy and patient cooperative  Airway & Oxygen Therapy: Patient Spontanous Breathing and Patient connected to face mask oxygen  Post-op Assessment: Report given to RN, Post -op Vital signs reviewed and stable and Patient moving all extremities  Post vital signs: Reviewed and stable  Last Vitals:  Vitals Value Taken Time  BP 133/84 11/06/18 1600  Temp 36 C 11/06/18 1600  Pulse 73 11/06/18 1600  Resp 13 11/06/18 1600  SpO2 100 % 11/06/18 1600    Last Pain:  Vitals:   11/06/18 1600  PainSc: Asleep         Complications: No apparent anesthesia complications

## 2018-11-06 NOTE — Transfer of Care (Deleted)
Immediate Anesthesia Transfer of Care Note  Patient: Kyle Gregory  Procedure(s) Performed: TRANSURETHRAL RESECTION OF BLADDER TUMOR (TURBT) (N/A Bladder)  Patient Location: PACU  Anesthesia Type:General  Level of Consciousness: drowsy and patient cooperative  Airway & Oxygen Therapy: Patient Spontanous Breathing and Patient connected to face mask oxygen  Post-op Assessment: Report given to RN, Post -op Vital signs reviewed and stable and Patient moving all extremities  Post vital signs: Reviewed and stable  Last Vitals:  Vitals Value Taken Time  BP    Temp    Pulse    Resp    SpO2      Last Pain:  Vitals:   11/06/18 1600  PainSc: (P) Asleep         Complications: No apparent anesthesia complications

## 2018-11-06 NOTE — Anesthesia Postprocedure Evaluation (Signed)
Anesthesia Post Note  Patient: Kyle Gregory  Procedure(s) Performed: TRANSURETHRAL RESECTION OF BLADDER TUMOR (TURBT) (N/A Bladder)  Patient location during evaluation: PACU Anesthesia Type: General Level of consciousness: awake and alert Pain management: pain level controlled Vital Signs Assessment: post-procedure vital signs reviewed and stable Respiratory status: spontaneous breathing and respiratory function stable Cardiovascular status: stable Anesthetic complications: no     Last Vitals:  Vitals:   11/06/18 1705 11/06/18 1733  BP: 122/74 (!) 115/59  Pulse: 88 72  Resp: 18 16  Temp:    SpO2: 92% 100%    Last Pain:  Vitals:   11/06/18 1733  PainSc: 0-No pain                 Gustave Lindeman K

## 2018-11-06 NOTE — H&P (Addendum)
11/06/18 2:43 PM   Kyle Gregory [birth year] [id number]  HPI: Mr. Bondy is here for TURBT for recurrence of bladder cancer.  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  He failed BCG induction and was lost to follow-up, then recurred in September 2019 and underwent repeat TURBT of a 2 cm high-grade Ta lesion at that time.  He got induction gemcitabine secondary to a BCG shortage, however he recurred again in June 2020 with multiple small tumors.  He is not a cystectomy candidate.  He denies any fevers, chills, chest pain, or shortness of breath.   PMH: Past Medical History:  Diagnosis Date  . Anemia   . Aortic stenosis   . Arrhythmia   . Atrial fibrillation ([place])   . Bladder cancer ([place])   . Broken skin    buttocks  . Cardiomyopathy ([place])   . CHF (congestive heart failure) ([place])   . COPD (chronic obstructive pulmonary disease) ([place])   . Coronary artery disease   . Dementia ([place])   . Dysrhythmia   . Elevated lipids   . GERD (gastroesophageal reflux disease)   . Heartburn   . Lower extremity edema   . Presence of permanent cardiac pacemaker   . Stroke ([place])    x 2 after heart surgery  . VRE (vancomycin resistant enterococcus) culture positive     Surgical History: Past Surgical History:  Procedure Laterality Date  . CARDIAC VALVE REPLACEMENT    . 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 CYSTOSCOPY W/ RETROGRADES Bilateral 08/14/2016   Procedure: CYSTOSCOPY WITH RETROGRADE PYELOGRAM;  Surgeon: Hollice Espy, MD;  Location: [facility];  Service: Urology;  Laterality: Bilateral;  . LEFT HEART CATH AND CORONARY ANGIOGRAPHY Left 09/05/2016   Procedure: Left Heart Cath and Coronary Angiography;  Surgeon: Teodoro Spray, MD;  Location: [facility];  Service: Cardiovascular;  Laterality: Left;  . neck growth removal     1970's  . PACEMAKER INSERTION N/A 06/24/2018   Procedure: INSERTION PACEMAKER;  Surgeon: Isaias Cowman, MD;  Location: [facility] OR[facility];  Service: Cardiovascular;  Laterality: N/A;  . TEMPORARY PACEMAKER N/A 06/22/2018   Procedure: TEMPORARY PACEMAKER;  Surgeon: Yolonda Kida, MD;  Location: [facility];  Service: Cardiovascular;  Laterality: N/A;  . TONSILLECTOMY    . TRANSURETHRAL RESECTION OF BLADDER TUMOR N/A 08/14/2016   Procedure: TRANSURETHRAL RESECTION OF BLADDER TUMOR (TURBT);  Surgeon: Hollice Espy, MD;  Location: [facility] OR[facility];  Service: Urology;  Laterality: N/A;  . TRANSURETHRAL RESECTION OF BLADDER TUMOR N/A 01/25/2018   Procedure: TRANSURETHRAL RESECTION OF BLADDER TUMOR (TURBT);  Surgeon: Billey Co, MD;  Location: [facility];  Service: Urology;  Laterality: N/A;    Allergies:  Allergies  Allergen Reactions  . Amiodarone Other (See Comments)    Dizziness  . Donepezil Palpitations    Severe bradycardia    Family History: Family History  Problem Relation Age of Onset  . Heart failure Father   . Heart failure Brother   . Atrial fibrillation Sister   . Prostate cancer Neg Hx   . Kidney cancer Neg Hx   . Bladder Cancer Neg Hx     Social History:  reports that he has never smoked. He has never used smokeless tobacco. He reports that he does not drink alcohol or use drugs.  ROS: Please see flowsheet from today's date for complete review  of systems.  Physical Exam: BP 121/79   Pulse 70   Resp 16   Ht 6' (1.829 m)   Wt 89.1 kg   SpO2 100%   BMI 26.64 kg/m    Constitutional:  Alert and oriented, No acute distress. Cardiovascular: Irregularly irregular Respiratory: Clear to auscultation bilaterally GI: Abdomen is soft, nontender, nondistended, no abdominal masses Lymph: No cervical or inguinal lymphadenopathy. Skin: No rashes, bruises or suspicious lesions. Neurologic: Grossly intact, no focal deficits, moving all 4 extremities. Psychiatric: Normal mood and affect.  Laboratory Data: Urine  culture 10/25/2018 20 5K mixed urogenital flora  Pertinent Imaging: CT urogram 12/2017 with no upper tract lesions  Assessment & Plan:   In summary, the patient is a comorbid 78 year old male with history of recurrent HG bladder cancer, here today for repeat TURBT for recurrence of disease seen on clinic cystoscopy.    We discussed the risks of bleeding, infection, bladder injury, recurrence, need for additional procedures, stroke, MI, PE, other cardiac events, and even death.  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  TURBT today  Billey Co, [facility] [address] [place], [place] [address] [phone]

## 2018-11-06 NOTE — Discharge Instructions (Signed)
Transurethral Resection of Bladder Tumor, Care After This sheet gives you information about how to care for yourself after your procedure. Your health care provider may also give you more specific instructions. If you have problems or questions, contact your health care provider. What can I expect after the procedure? After the procedure, it is common to have:  A small amount of blood in your urine for up to 2 weeks.  Soreness or mild pain from your catheter. After your catheter is removed, you may have mild soreness, especially when urinating.  Pain in your lower abdomen. Follow these instructions at home: Medicines   Take over-the-counter and prescription medicines only as told by your health care provider.  If you were prescribed an antibiotic medicine, take it as told by your health care provider. Do not stop taking the antibiotic even if you start to feel better.  Do not drive for 24 hours if you were given a sedative during your procedure.  Ask your health care provider if the medicine prescribed to you: ? Requires you to avoid driving or using heavy machinery. ? Can cause constipation. You may need to take these actions to prevent or treat constipation:  Take over-the-counter or prescription medicines.  Eat foods that are high in fiber, such as beans, whole grains, and fresh fruits and vegetables.  Limit foods that are high in fat and processed sugars, such as fried or sweet foods. Activity  Return to your normal activities as told by your health care provider. Ask your health care provider what activities are safe for you.  Do not lift anything that is heavier than 10 lb (4.5 kg), or the limit that you are told, until your health care provider says that it is safe.  Avoid intense physical activity for as long as told by your health care provider.  Rest as told by your health care provider.  Avoid sitting for a long time without moving. Get up to take short walks every  1-2 hours. This is important to improve blood flow and breathing. Ask for help if you feel weak or unsteady. General instructions   Do not drink alcohol for as long as told by your health care provider. This is especially important if you are taking prescription pain medicines.  Do not take baths, swim, or use a hot tub until your health care provider approves. Ask your health care provider if you may take showers. You may only be allowed to take sponge baths.  If you have a catheter, follow instructions from your health care provider about caring for your catheter and your drainage bag.  Drink enough fluid to keep your urine pale yellow.  Wear compression stockings as told by your health care provider. These stockings help to prevent blood clots and reduce swelling in your legs.  Keep all follow-up visits as told by your health care provider. This is important. ? You will need to be followed closely with regular checks of your bladder and urethra (cystoscopies) to make sure that the cancer does not come back. Contact a health care provider if:  You have pain that gets worse or does not improve with medicine.  You have blood in your urine for more than 2 weeks.  You have cloudy or bad-smelling urine.  You become constipated. Signs of constipation may include having: ? Fewer than three bowel movements in a week. ? Difficulty having a bowel movement. ? Stools that are dry, hard, or larger than normal.  You have  a fever. Get help right away if:  You have: ? Severe pain. ? Bright red blood in your urine. ? Blood clots in your urine. ? A lot of blood in your urine.  Your catheter has been removed and you are not able to urinate.  You have a catheter in place and the catheter is not draining urine. Summary  After your procedure, it is common to have a small amount of blood in your urine, soreness or mild pain from your catheter, and pain in your lower abdomen.  Take  over-the-counter and prescription medicines only as told by your health care provider.  Rest as told by your health care provider. Follow your health care provider's instructions about returning to normal activities. Ask what activities are safe for you.  If you have a catheter, follow instructions from your health care provider about caring for your catheter and your drainage bag.  Get help right away if you cannot urinate, you have severe pain, or you have bright red blood or blood clots in your urine. This information is not intended to replace advice given to you by your health care provider. Make sure you discuss any questions you have with your health care provider. Document Released: 03/29/2015 Document Revised: 11/15/2017 Document Reviewed: 11/15/2017 Elsevier Patient Education  2020 [place]   1) The drugs that you were given will stay in your system until tomorrow so for the next 24 hours you should not:  A) Drive an automobile B) Make any legal decisions C) Drink any alcoholic beverage   2) You may resume regular meals tomorrow.  Today it is better to start with liquids and gradually work up to solid foods.  You may eat anything you prefer, but it is better to start with liquids, then soup and crackers, and gradually work up to solid foods.   3) Please notify your doctor immediately if you have any unusual bleeding, trouble breathing, redness and pain at the surgery site, drainage, fever, or pain not relieved by medication.    4) Additional Instructions:        Please contact your physician with any problems or Same Day Surgery at [phone], Monday through Friday 6 am to 4 pm, or [place] at [facility] number at [phone].

## 2018-11-06 NOTE — Op Note (Signed)
Date of procedure: 11/06/18  Preoperative diagnosis:  1. Bladder tumor, ~2cm total  Postoperative diagnosis:  1. Same  Procedure: 1. TURBT, small  Surgeon: Nickolas Madrid, MD  Anesthesia: General  Complications: None  Intraoperative findings:  1.  Normal urethra, small prostate, ureteral orifices orthotopic bilaterally 2.  1 cm papillary tumor at left bladder wall, 1 cm tumor at right anterior bladder wall, multiple small satellite lesions 3.  All tumors resected entirely, excellent hemostasis  EBL: Minimal  Specimens: Bladder tumor for permanent  Drains: None  Indication: Kyle Gregory is a 78 y.o. patient with recurrent bladder cancer that was found to have recurrence on clinic cystoscopy.  After reviewing the management options for treatment, they elected to proceed with the above surgical procedure(s). We have discussed the potential benefits and risks of the procedure, side effects of the proposed treatment, the likelihood of the patient achieving the goals of the procedure, and any potential problems that might occur during the procedure or recuperation. Informed consent has been obtained.  Description of procedure:  The patient was taken to the operating room and general anesthesia was induced. SCDs were placed for DVT prophylaxis. The patient was placed in the dorsal lithotomy position, prepped and draped in the usual sterile fashion, and preoperative antibiotics were administered. A preoperative time-out was performed.   A 21 French rigid cystoscope with a 30 degree lens was used to intubate the urethra.  Normal-appearing urethra was followed proximally into the bladder.  The prostate was small.  Thorough cystoscopy demonstrated a 1 cm papillary lesion on the left bladder wall, a 1 cm papillary lesion at the right anterior bladder wall, and a few small satellite lesions early for papillary bladder tumors.  The ureteral orifices were orthotopic bilaterally, and there was no  tumor adjacent to the ureters.  Cold cup biopsy forceps were used to remove the 1 cm tumor at the left bladder wall to facilitate pathology.  The cystoscope was then exchanged for a resectoscope using the visual obturator, and the small resecting loop was used to resect all remaining visible tumor.  At no point was the resection deep past muscle.  All abnormal tissue was thoroughly fulgurated and there was excellent hemostasis.  With the bladder completely decompressed, there is no bleeding noted.  Disposition: Stable to PACU  Plan: Patient must void prior to discharge Referred to oncology for consideration of IV Keytruda for BCG refractory NMIBC, not a cystectomy candidate secondary to his numerous co-morbidities  Nickolas Madrid, MD

## 2018-11-06 NOTE — Progress Notes (Signed)
Patient came in with wife today to SDS. Stated a wasp was in their car and he got stung. Patient has a 2-3 in approximate whelp on his right bicep near the [facility] with a red dot in the middle, wife states not allergy that they know of. Placed ice on arm and called anesthesia gave a verbal for IV benadryl.   Wife also stated that patient has a reoccurring skin tear on his buttocks in the coccyx area. Currently it is open, raw, and causing him a lot of pain/burning sensation. This was not visualized by this RN.

## 2018-11-07 ENCOUNTER — Encounter: Payer: Self-pay | Admitting: Urology

## 2018-11-08 ENCOUNTER — Ambulatory Visit: Payer: Self-pay

## 2018-11-08 LAB — SURGICAL PATHOLOGY

## 2018-11-11 ENCOUNTER — Telehealth: Payer: Self-pay | Admitting: Urology

## 2018-11-11 NOTE — Telephone Encounter (Signed)
Patient's wife notified and voiced understanding.

## 2018-11-11 NOTE — Telephone Encounter (Signed)
-----   Message from Billey Co, MD sent at 11/11/2018  9:03 AM EDT ----- Patient with long history of recurrent bladder cancer. Recent turbt showed similar pathology with bladder cancer not invading the muscle, just like before. I placed the referral to medical oncology to consider the IV medication to prevent recurrence instead of BCG.  Nickolas Madrid, MD 11/11/2018

## 2018-11-11 NOTE — Telephone Encounter (Signed)
Should patient stop Eliquis again for a day or 2.

## 2018-11-11 NOTE — Telephone Encounter (Signed)
Patient notified and voiced understanding. Appointment has been made with Oncology.

## 2018-11-11 NOTE — Telephone Encounter (Signed)
Yes, stop eliquis 2 days, and can resume if urine light pink or yellow  Nickolas Madrid, MD 11/11/2018

## 2018-11-11 NOTE — Telephone Encounter (Signed)
Pt had surgery on 7/8 w/Sninsky.  The blood in urine had cleared up after surgery, but after pt started taking eliquis, there is blood in his urine again.

## 2018-11-13 ENCOUNTER — Telehealth: Payer: Self-pay | Admitting: Urology

## 2018-11-13 NOTE — Telephone Encounter (Signed)
OK to hold eliquis an additional 2 days until urine clears. Continue to drink plenty of fluids to flush the bladder.  Nickolas Madrid, MD 11/13/2018

## 2018-11-13 NOTE — Telephone Encounter (Signed)
Should he continuing holding Eliquis for a few more days?

## 2018-11-13 NOTE — Telephone Encounter (Signed)
Pt's wife called and states that the pt is still having bloody urine sometimes w/ small clots. Please advise.

## 2018-11-13 NOTE — Telephone Encounter (Signed)
ERROR

## 2018-11-14 NOTE — Telephone Encounter (Signed)
Pt's wife called back and I read message from [facility]

## 2018-11-15 ENCOUNTER — Ambulatory Visit: Payer: Self-pay

## 2018-11-17 NOTE — Progress Notes (Signed)
[place]  [phone]) [phone] [phone]) [phone]  ID: [name] OB: 01-17-1941  MR#: [id number]  IHK#:[id number]  Patient Care Team: Rusty Aus, MD as PCP - General (Internal Medicine)  CHIEF COMPLAINT: Invasive bladder cancer  INTERVAL HISTORY: Patient is a 78 year old male who was previously evaluated for intravesical gemcitabine for noninvasive bladder cancer.  Recent cystoscopy revealed his malignancy has progressed and he now has invasive disease.  Patient is not a candidate for cystectomy or systemic chemotherapy, but has been referred for consideration of systemic immunotherapy.  Patient has underlying dementia and much of the history is given by his wife who is on the phone.  He currently feels well and is asymptomatic.  He has no neurologic complaints.  He denies any recent fevers or illnesses.  He has a good appetite and denies weight loss.  He denies any chest pain, shortness of breath, cough, or hemoptysis.  He denies any nausea, vomiting, constipation, or diarrhea.  He has no urinary complaints.  He denies hematuria.  Patient offers no further specific complaints today.  REVIEW OF SYSTEMS:   Review of Systems  Constitutional: Negative.  Negative for fever, malaise/fatigue and weight loss.  Respiratory: Negative.  Negative for cough and shortness of breath.   Cardiovascular: Negative.  Negative for chest pain and leg swelling.  Gastrointestinal: Negative.  Negative for abdominal pain.  Genitourinary: Negative.  Negative for dysuria and hematuria.  Musculoskeletal: Negative.  Negative for back pain.  Skin: Negative.  Negative for rash.  Neurological: Negative.  Negative for focal weakness, weakness and headaches.  Psychiatric/Behavioral: Positive for memory loss. The patient is not nervous/anxious.     As per HPI. Otherwise, a complete review of systems is negative.  PAST MEDICAL HISTORY: Past Medical History:  Diagnosis Date  . Anemia   .  Aortic stenosis   . Arrhythmia   . Atrial fibrillation ([place])   . Bladder cancer ([place])   . Broken skin    buttocks  . Cardiomyopathy ([place])   . CHF (congestive heart failure) ([place])   . COPD (chronic obstructive pulmonary disease) ([place])   . Coronary artery disease   . Dementia ([place])   . Dysrhythmia   . Elevated lipids   . GERD (gastroesophageal reflux disease)   . Heartburn   . Lower extremity edema   . Presence of permanent cardiac pacemaker   . 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 VRE (vancomycin resistant enterococcus) culture positive     PAST SURGICAL HISTORY: Past Surgical History:  Procedure Laterality Date  . CARDIAC VALVE REPLACEMENT    . 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 CYSTOSCOPY W/ RETROGRADES Bilateral 08/14/2016   Procedure: CYSTOSCOPY WITH RETROGRADE PYELOGRAM;  Surgeon: Hollice Espy, MD;  Location: [facility];  Service: Urology;  Laterality: Bilateral;  . LEFT HEART CATH AND CORONARY ANGIOGRAPHY Left 09/05/2016   Procedure: Left Heart Cath and Coronary Angiography;  Surgeon: Teodoro Spray, MD;  Location: [facility];  Service: Cardiovascular;  Laterality: Left;  . neck growth removal     1970's  . PACEMAKER INSERTION N/A 06/24/2018   Procedure: INSERTION PACEMAKER;  Surgeon: Isaias Cowman, MD;  Location: [facility];  Service: Cardiovascular;  Laterality: N/A;  . TEMPORARY PACEMAKER N/A 06/22/2018   Procedure: TEMPORARY PACEMAKER;  Surgeon: Yolonda Kida, MD;  Location: [facility];  Service: Cardiovascular;  Laterality: N/A;  . TONSILLECTOMY    .  TRANSURETHRAL RESECTION OF BLADDER TUMOR N/A 08/14/2016   Procedure: TRANSURETHRAL RESECTION OF BLADDER TUMOR (TURBT);  Surgeon: Hollice Espy, MD;  Location: [facility];  Service: Urology;  Laterality: N/A;  . TRANSURETHRAL RESECTION OF BLADDER TUMOR N/A 01/25/2018   Procedure: TRANSURETHRAL RESECTION OF BLADDER TUMOR (TURBT);  Surgeon: Billey Co, MD;  Location: [facility];  Service: Urology;  Laterality: N/A;  . TRANSURETHRAL RESECTION OF BLADDER TUMOR N/A 11/06/2018   Procedure: TRANSURETHRAL RESECTION OF BLADDER TUMOR (TURBT);  Surgeon: Billey Co, MD;  Location: [facility];  Service: Urology;  Laterality: N/A;    FAMILY HISTORY: Family History  Problem Relation Age of Onset  . Heart failure Father   . Heart failure Brother   . Atrial fibrillation Sister   . Prostate cancer Neg Hx   . Kidney cancer Neg Hx   . Bladder Cancer Neg Hx     ADVANCED DIRECTIVES (Y/N):  N  HEALTH MAINTENANCE: Social History   Tobacco Use  . Smoking status: Never Smoker  . Smokeless tobacco: Never Used  Substance Use Topics  . Alcohol use: No  . Drug use: No     Colonoscopy:  PAP:  Bone density:  Lipid panel:  Allergies  Allergen Reactions  . Amiodarone Other (See Comments)    Dizziness  . Donepezil Palpitations    Severe bradycardia    Current Outpatient Medications  Medication Sig Dispense Refill  . apixaban (ELIQUIS) 5 MG TABS tablet Take 1 tablet (5 mg total) by mouth 2 (two) times daily. 60 tablet 0  . Calcium Carbonate-Vitamin D (OYSTER SHELL CALCIUM 500 + D) 500-125 MG-UNIT TABS Take 1 tablet by mouth daily.     . ferrous sulfate 325 (65 FE) MG tablet Take 325 mg by mouth 2 (two) times daily with a meal.    . FLUoxetine (PROZAC) 20 MG capsule Take 20 mg by mouth daily.     . Melatonin 3 MG TABS Take 3 mg by mouth at bedtime.     . pravastatin (PRAVACHOL) 40 MG tablet Take 40 mg by mouth at bedtime.   11  . spironolactone (ALDACTONE) 25 MG tablet Take 25 mg by mouth daily.     . furosemide (LASIX) 20 MG tablet Take 20 mg by mouth daily as needed (swelling feet/ankles.).     Marland Kitchen nitroGLYCERIN (NITROSTAT) 0.4 MG SL tablet Place 0.4 mg under the tongue every 5 (five) minutes as needed for chest pain (Up to 3 doses).     Marland Kitchen omeprazole (PRILOSEC) 20 MG capsule Take 20 mg by mouth daily as needed (Heartburn or acid reflux).      . zinc oxide ([employer] ZINC OXIDE) 20 % ointment Apply 1 application topically 2 (two) times daily as needed for irritation.      No current facility-administered medications for this visit.     OBJECTIVE: Vitals:   11/21/18 1033  BP: 100/66  Pulse: 76  Temp: 97.8 F (36.6 C)     Body mass index is 27.45 kg/m.    ECOG FS:0 - Asymptomatic  General: Well-developed, well-nourished, no acute distress. Eyes: Pink conjunctiva, anicteric sclera. HEENT: Normocephalic, moist mucous membranes, clear oropharnyx. Lungs: Clear to auscultation bilaterally. Heart: Regular rate and rhythm. No rubs, murmurs, or gallops. Abdomen: Soft, nontender, nondistended. No organomegaly noted, normoactive bowel sounds. Musculoskeletal: No edema, cyanosis, or clubbing. Neuro: Alert, answering all questions appropriately. Cranial nerves grossly intact. Skin: No rashes or petechiae noted. Psych: Normal affect. Lymphatics: No cervical, calvicular, axillary or inguinal LAD.  LAB RESULTS:  Lab Results  Component Value Date   NA 136 11/01/2018   K 4.6 11/01/2018   CL 104 11/01/2018   CO2 24 11/01/2018   GLUCOSE 92 11/01/2018   BUN 23 11/01/2018   CREATININE 1.05 11/01/2018   CALCIUM 8.9 11/01/2018   PROT 7.8 04/10/2018   ALBUMIN 3.8 04/10/2018   AST 48 (H) 04/10/2018   ALT 34 04/10/2018   ALKPHOS 105 04/10/2018   BILITOT 1.9 (H) 04/10/2018   GFRNONAA >60 11/01/2018   GFRAA >60 11/01/2018    Lab Results  Component Value Date   WBC 7.3 11/01/2018   NEUTROABS 3.1 09/03/2017   HGB 14.2 11/01/2018   HCT 43.4 11/01/2018   MCV 96.2 11/01/2018   PLT 124 (L) 11/01/2018     STUDIES: No results found.  ASSESSMENT: Invasive bladder cancer  PLAN:    1.  Invasive bladder cancer: Patient was diagnosed with a high-grade noninvasive bladder cancer on January 25, 2018.  He received 6 infusions of intravesicular gemcitabine at that time.  More recently cystoscopy revealed invasive disease.  Patient is  not a candidate for cystectomy or systemic chemotherapy, but he may benefit from immunotherapy treatment using Tecentriq.  We will get baseline CT scan of abdomen and pelvis for further evaluation.  Port placement was discussed, but is not necessary at this time.  If patient IV access becomes difficult in the future, can reconsider this option.  Return to clinic on December 02, 2018 for further evaluation and initiation of cycle 1 of Tecentriq.    I spent a total of 60 minutes face-to-face with the patient of which greater than 50% of the visit was spent in counseling and coordination of care as detailed above.  Patient expressed understanding and was in agreement with this plan. He also understands that He can call clinic at any time with any questions, concerns, or complaints.   Cancer Staging No matching staging information was found for the patient.  Lloyd Huger, MD   11/22/2018 6:48 AM

## 2018-11-20 ENCOUNTER — Other Ambulatory Visit: Payer: Self-pay

## 2018-11-21 ENCOUNTER — Inpatient Hospital Stay: Payer: Medicare Other | Attending: Oncology | Admitting: Oncology

## 2018-11-21 ENCOUNTER — Encounter: Payer: Self-pay | Admitting: Oncology

## 2018-11-21 ENCOUNTER — Other Ambulatory Visit: Payer: Self-pay

## 2018-11-21 VITALS — BP 100/66 | HR 76 | Temp 97.8°F | Wt 202.4 lb

## 2018-11-21 DIAGNOSIS — C679 Malignant neoplasm of bladder, unspecified: Secondary | ICD-10-CM | POA: Diagnosis present

## 2018-11-21 DIAGNOSIS — F0390 Unspecified dementia without behavioral disturbance: Secondary | ICD-10-CM | POA: Insufficient documentation

## 2018-11-21 DIAGNOSIS — Z7189 Other specified counseling: Secondary | ICD-10-CM

## 2018-11-21 NOTE — Patient Instructions (Signed)
Atezolizumab injection What is this medicine? ATEZOLIZUMAB (a te zoe LIZ ue mab) is a monoclonal antibody. It is used to treat bladder cancer (urothelial cancer), non-small cell lung cancer, small cell lung cancer, and breast cancer. This medicine may be used for other purposes; ask your health care provider or pharmacist if you have questions. COMMON BRAND NAME(S): Tecentriq What should I tell my health care provider before I take this medicine? They need to know if you have any of these conditions:  diabetes  immune system problems  infection  inflammatory bowel disease  liver disease  lung or breathing disease  lupus  nervous system problems like myasthenia gravis or Guillain-Barre syndrome  organ transplant  an unusual or allergic reaction to atezolizumab, other medicines, foods, dyes, or preservatives  pregnant or trying to get pregnant  breast-feeding How should I use this medicine? This medicine is for infusion into a vein. It is given by a health care professional in a hospital or clinic setting. A special MedGuide will be given to you before each treatment. Be sure to read this information carefully each time. Talk to your pediatrician regarding the use of this medicine in children. Special care may be needed. Overdosage: If you think you have taken too much of this medicine contact a poison control center or emergency room at once. NOTE: This medicine is only for you. Do not share this medicine with others. What if I miss a dose? It is important not to miss your dose. Call your doctor or health care professional if you are unable to keep an appointment. What may interact with this medicine? Interactions have not been studied. This list may not describe all possible interactions. Give your health care provider a list of all the medicines, herbs, non-prescription drugs, or dietary supplements you use. Also tell them if you smoke, drink alcohol, or use illegal drugs.  Some items may interact with your medicine. What should I watch for while using this medicine? Your condition will be monitored carefully while you are receiving this medicine. You may need blood work done while you are taking this medicine. Do not become pregnant while taking this medicine or for at least 5 months after stopping it. Women should inform their doctor if they wish to become pregnant or think they might be pregnant. There is a potential for serious side effects to an unborn child. Talk to your health care professional or pharmacist for more information. Do not breast-feed an infant while taking this medicine or for at least 5 months after the last dose. What side effects may I notice from receiving this medicine? Side effects that you should report to your doctor or health care professional as soon as possible:  allergic reactions like skin rash, itching or hives, swelling of the face, lips, or tongue  black, tarry stools  bloody or watery diarrhea  breathing problems  changes in vision  chest pain or chest tightness  chills  facial flushing  fever  headache  signs and symptoms of high blood sugar such as dizziness; dry mouth; dry skin; fruity breath; nausea; stomach pain; increased hunger or thirst; increased urination  signs and symptoms of liver injury like dark yellow or brown urine; general ill feeling or flu-like symptoms; light-colored stools; loss of appetite; nausea; right upper belly pain; unusually weak or tired; yellowing of the eyes or skin  stomach pain  trouble passing urine or change in the amount of urine Side effects that usually do not require medical   attention (report to your doctor or health care professional if they continue or are bothersome):  cough  diarrhea  joint pain  muscle pain  muscle weakness  tiredness  weight loss This list may not describe all possible side effects. Call your doctor for medical advice about side  effects. You may report side effects to FDA at 1-800-FDA-1088. Where should I keep my medicine? This drug is given in a hospital or clinic and will not be stored at home. NOTE: This sheet is a summary. It may not cover all possible information. If you have questions about this medicine, talk to your doctor, pharmacist, or health care provider.  2020 Elsevier/Gold Standard (2017-07-20 09:33:38)  

## 2018-11-22 ENCOUNTER — Ambulatory Visit: Payer: Self-pay

## 2018-11-22 DIAGNOSIS — Z7189 Other specified counseling: Secondary | ICD-10-CM | POA: Insufficient documentation

## 2018-11-22 MED ORDER — PROCHLORPERAZINE MALEATE 10 MG PO TABS: 10.0000 mg | ORAL_TABLET | Freq: Four times a day (QID) | ORAL | 2 refills | Status: DC | PRN

## 2018-11-22 NOTE — Progress Notes (Signed)
START OFF PATHWAY REGIMEN - Bladder   [id number]:Atezolizumab 1,200 mg q21 Days:   A cycle is every 21 days:     Atezolizumab   **Always confirm dose/schedule in your pharmacy ordering system**  Patient Characteristics: Pre-Cystectomy or Nonsurgical Candidate (Clinical Staging), cT2-4a, cN0-1, M0, Cystectomy Ineligible Therapeutic Status: Pre-Cystectomy or Nonsurgical Candidate (Clinical Staging) AJCC M Category: cM0 AJCC 8 Stage Grouping: II AJCC T Category: cT2 AJCC N Category: cN0 Intent of Therapy: Non-Curative / Palliative Intent, Discussed with Patient

## 2018-11-26 ENCOUNTER — Other Ambulatory Visit: Payer: Self-pay

## 2018-11-26 ENCOUNTER — Telehealth: Payer: Self-pay | Admitting: Urology

## 2018-11-26 NOTE — Telephone Encounter (Signed)
Patient's wife called and wanted [place] to know that he is having hematuria again. She stated that it was very red and has been going on since this morning. He has started back on his blood thinner. Is this normal?   [name]

## 2018-11-26 NOTE — Telephone Encounter (Signed)
The CT was ordered by oncology so would proceed with that.   Hold eliquis one week, he really didn't have a big bladder tumor resection so Im surprised he keeps having bleeding. He may just need a little longer to heal.   Thanks  Nickolas Madrid, MD 11/26/2018

## 2018-11-26 NOTE — Telephone Encounter (Signed)
Patient started noticing blood in his urine this morning-has been ongoing throughout the day. Denies pain, fevers or any other symptoms. Started back on Eliquis 11/17/2018.

## 2018-11-26 NOTE — Telephone Encounter (Signed)
Patient's wife informed verbalized understanding to hold for one week and proceed with CT as scheduled.

## 2018-11-27 ENCOUNTER — Inpatient Hospital Stay: Payer: Medicare Other

## 2018-11-28 ENCOUNTER — Ambulatory Visit
Admission: RE | Admit: 2018-11-28 | Discharge: 2018-11-28 | Disposition: A | Discharge status: Home / Self Care | Payer: Medicare Other | Source: Ambulatory Visit | Attending: Oncology | Admitting: Oncology

## 2018-11-28 ENCOUNTER — Ambulatory Visit: Payer: Medicare Other

## 2018-11-28 ENCOUNTER — Other Ambulatory Visit: Payer: Self-pay

## 2018-11-28 DIAGNOSIS — C679 Malignant neoplasm of bladder, unspecified: Secondary | ICD-10-CM | POA: Insufficient documentation

## 2018-11-28 MED ORDER — IOHEXOL 300 MG/ML  SOLN
100.0000 mL | Freq: Once | INTRAMUSCULAR | Status: AC | PRN
  Administered 2018-11-28: 100 mL via INTRAVENOUS

## 2018-11-29 ENCOUNTER — Ambulatory Visit: Payer: Self-pay

## 2018-12-01 NOTE — Progress Notes (Signed)
[place]  Telephone:(336) [phone] Fax:(336) [phone]  ID: Kyle Gregory OB: August 24, 1940  MR#: [id number]  KGY#:[id number]  Patient Care Team: Rusty Aus, MD as PCP - General (Internal Medicine)  CHIEF COMPLAINT: Invasive bladder cancer  INTERVAL HISTORY: Patient returns to clinic today for further evaluation and discussion of his imaging results and initiation of cycle 1 of Tecentriq.  Much of the history is given by his wife given his mild dementia, but he currently feels well and is asymptomatic. He has no neurologic complaints.  He denies any recent fevers or illnesses.  He has a good appetite and denies weight loss.  He denies any chest pain, shortness of breath, cough, or hemoptysis.  He denies any nausea, vomiting, constipation, or diarrhea.  He has no urinary complaints.  He denies hematuria.  Patient feels that his baseline offers no specific complaints today.  REVIEW OF SYSTEMS:   Review of Systems  Constitutional: Negative.  Negative for fever, malaise/fatigue and weight loss.  Respiratory: Negative.  Negative for cough and shortness of breath.   Cardiovascular: Negative.  Negative for chest pain and leg swelling.  Gastrointestinal: Negative.  Negative for abdominal pain.  Genitourinary: Negative.  Negative for dysuria and hematuria.  Musculoskeletal: Negative.  Negative for back pain.  Skin: Negative.  Negative for rash.  Neurological: Negative.  Negative for focal weakness, weakness and headaches.  Psychiatric/Behavioral: Positive for memory loss. The patient is not nervous/anxious.     As per HPI. Otherwise, a complete review of systems is negative.  PAST MEDICAL HISTORY: Past Medical History:  Diagnosis Date  . Anemia   . Aortic stenosis   . Arrhythmia   . Atrial fibrillation ([place])   . Bladder cancer ([place])   . Broken skin    buttocks  . Cardiomyopathy ([place])   . CHF (congestive heart failure) ([place])   . COPD (chronic obstructive pulmonary  disease) ([place])   . Coronary artery disease   . Dementia ([place])   . Dysrhythmia   . Elevated lipids   . GERD (gastroesophageal reflux disease)   . Heartburn   . Lower extremity edema   . Presence of permanent cardiac pacemaker   . Stroke ([place])    x 2 after heart surgery  . VRE (vancomycin resistant enterococcus) culture positive     PAST SURGICAL HISTORY: Past Surgical History:  Procedure Laterality Date  . CARDIAC VALVE REPLACEMENT    . 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 CYSTOSCOPY W/ RETROGRADES Bilateral 08/14/2016   Procedure: CYSTOSCOPY WITH RETROGRADE PYELOGRAM;  Surgeon: Hollice Espy, MD;  Location: [facility];  Service: Urology;  Laterality: Bilateral;  . LEFT HEART CATH AND CORONARY ANGIOGRAPHY Left 09/05/2016   Procedure: Left Heart Cath and Coronary Angiography;  Surgeon: Teodoro Spray, MD;  Location: [place] [facility];  Service: Cardiovascular;  Laterality: Left;  . neck growth removal     1970's  . PACEMAKER INSERTION N/A 06/24/2018   Procedure: INSERTION PACEMAKER;  Surgeon: Isaias Cowman, MD;  Location: [facility];  Service: Cardiovascular;  Laterality: N/A;  . TEMPORARY PACEMAKER N/A 06/22/2018   Procedure: TEMPORARY PACEMAKER;  Surgeon: Yolonda Kida, MD;  Location: [place] [facility];  Service: Cardiovascular;  Laterality: N/A;  . TONSILLECTOMY    . TRANSURETHRAL RESECTION OF BLADDER TUMOR N/A 08/14/2016   Procedure: TRANSURETHRAL RESECTION OF BLADDER TUMOR (TURBT);  Surgeon: Hollice Espy, MD;  Location: [facility];  Service: Urology;  Laterality:  N/A;  . TRANSURETHRAL RESECTION OF BLADDER TUMOR N/A 01/25/2018   Procedure: TRANSURETHRAL RESECTION OF BLADDER TUMOR (TURBT);  Surgeon: Billey Co, MD;  Location: [facility];  Service: Urology;  Laterality: N/A;  . TRANSURETHRAL RESECTION OF BLADDER TUMOR N/A 11/06/2018   Procedure: TRANSURETHRAL RESECTION OF BLADDER TUMOR (TURBT);  Surgeon: Billey Co, MD;   Location: [facility];  Service: Urology;  Laterality: N/A;    FAMILY HISTORY: Family History  Problem Relation Age of Onset  . Heart failure Father   . Heart failure Brother   . Atrial fibrillation Sister   . Prostate cancer Neg Hx   . Kidney cancer Neg Hx   . Bladder Cancer Neg Hx     ADVANCED DIRECTIVES (Y/N):  N  HEALTH MAINTENANCE: Social History   Tobacco Use  . Smoking status: Never Smoker  . Smokeless tobacco: Never Used  Substance Use Topics  . Alcohol use: No  . Drug use: No     Colonoscopy:  PAP:  Bone density:  Lipid panel:  Allergies  Allergen Reactions  . Amiodarone Other (See Comments)    Dizziness  . Donepezil Palpitations    Severe bradycardia    Current Outpatient Medications  Medication Sig Dispense Refill  . apixaban (ELIQUIS) 5 MG TABS tablet Take 1 tablet (5 mg total) by mouth 2 (two) times daily. 60 tablet 0  . Calcium Carbonate-Vitamin D (OYSTER SHELL CALCIUM 500 + D) 500-125 MG-UNIT TABS Take 1 tablet by mouth daily.     . ferrous sulfate 325 (65 FE) MG tablet Take 325 mg by mouth 2 (two) times daily with a meal.    . FLUoxetine (PROZAC) 20 MG capsule Take 20 mg by mouth daily.     . furosemide (LASIX) 20 MG tablet Take 20 mg by mouth daily as needed (swelling feet/ankles.).     Marland Kitchen Melatonin 3 MG TABS Take 3 mg by mouth at bedtime.     . nitroGLYCERIN (NITROSTAT) 0.4 MG SL tablet Place 0.4 mg under the tongue every 5 (five) minutes as needed for chest pain (Up to 3 doses).     Marland Kitchen omeprazole (PRILOSEC) 20 MG capsule Take 20 mg by mouth daily as needed (Heartburn or acid reflux).     . pravastatin (PRAVACHOL) 40 MG tablet Take 40 mg by mouth at bedtime.   11  . spironolactone (ALDACTONE) 25 MG tablet Take 25 mg by mouth daily.     . prochlorperazine (COMPAZINE) 10 MG tablet Take 1 tablet (10 mg total) by mouth every 6 (six) hours as needed (Nausea or vomiting). (Patient not taking: Reported on 12/02/2018) 60 tablet 2  . zinc oxide ([employer] ZINC OXIDE)  20 % ointment Apply 1 application topically 2 (two) times daily as needed for irritation.      No current facility-administered medications for this visit.     OBJECTIVE: Vitals:   12/02/18 0941  BP: 101/67  Pulse: 75  Temp: 97.7 F (36.5 C)     Body mass index is 27.12 kg/m.    ECOG FS:0 - Asymptomatic  General: Well-developed, well-nourished, no acute distress. Eyes: Pink conjunctiva, anicteric sclera. HEENT: Normocephalic, moist mucous membranes. Lungs: Clear to auscultation bilaterally. Heart: Regular rate and rhythm. No rubs, murmurs, or gallops. Abdomen: Soft, nontender, nondistended. No organomegaly noted, normoactive bowel sounds. Musculoskeletal: No edema, cyanosis, or clubbing. Neuro: Alert, answering all questions appropriately. Cranial nerves grossly intact. Skin: No rashes or petechiae noted. Psych: Normal affect.  LAB RESULTS:  Lab Results  Component Value Date   NA 138 12/02/2018   K 4.7 12/02/2018   CL 105 12/02/2018   CO2 24 12/02/2018   GLUCOSE 99 12/02/2018   BUN 22 12/02/2018   CREATININE 1.24 12/02/2018   CALCIUM 9.1 12/02/2018   PROT 7.2 12/02/2018   ALBUMIN 3.7 12/02/2018   AST 21 12/02/2018   ALT 14 12/02/2018   ALKPHOS 77 12/02/2018   BILITOT 1.4 (H) 12/02/2018   GFRNONAA 55 (L) 12/02/2018   GFRAA >60 12/02/2018    Lab Results  Component Value Date   WBC 6.7 12/02/2018   NEUTROABS 5.2 12/02/2018   HGB 14.6 12/02/2018   HCT 43.3 12/02/2018   MCV 94.1 12/02/2018   PLT 113 (L) 12/02/2018     STUDIES: Ct Abdomen Pelvis W Contrast  Result Date: 11/28/2018 CLINICAL DATA:  Follow-up bladder carcinoma. EXAM: CT ABDOMEN AND PELVIS WITH CONTRAST TECHNIQUE: Multidetector CT imaging of the abdomen and pelvis was performed using the standard protocol following bolus administration of intravenous contrast. CONTRAST:  173mL OMNIPAQUE IOHEXOL 300 MG/ML  SOLN COMPARISON:  12/20/2017 FINDINGS: Lower Chest: No acute findings. Hepatobiliary: No  hepatic masses identified. Numerous tiny gallstones are again seen, however there is no evidence of cholecystitis or biliary ductal dilatation. Pancreas:  No mass or inflammatory changes. Spleen: Within normal limits in size and appearance. Adrenals/Urinary Tract: No adrenal or renal masses identified. Benign-appearing renal cysts are again seen bilaterally. No evidence of hydronephrosis. Asymmetric soft tissue thickening is seen along right lateral bladder wall which is similar to prior exam. There is also a focal area of wall thickening along the left lateral bladder wall which appears increased since prior exam. These areas are suspicious for recurrent or residual bladder carcinoma. Stomach/Bowel: No evidence of obstruction, inflammatory process or abnormal fluid collections. Vascular/Lymphatic: No pathologically enlarged lymph nodes. No abdominal aortic aneurysm. Aortic atherosclerosis. Reproductive:  No mass or other significant abnormality. Other:  None. Musculoskeletal:  No suspicious bone lesions identified. IMPRESSION: 1. Stable asymmetric soft tissue thickening along the right lateral bladder wall, and increased focal wall thickening along the left lateral bladder wall, suspicious for recurrent or residual bladder carcinoma. 2. No evidence of abdominal or pelvic metastatic disease. 3. Cholelithiasis. No radiographic evidence of cholecystitis. Aortic Atherosclerosis (ICD10-I70.0). Electronically Signed   By: Marlaine Hind M.D.   On: 11/28/2018 16:28    ASSESSMENT: Invasive bladder cancer  PLAN:    1.  Invasive bladder cancer: Patient was diagnosed with a high-grade noninvasive bladder cancer on January 25, 2018.  He received 6 infusions of intravesicular gemcitabine at that time.  More recently cystoscopy revealed invasive disease.  Patient is not a candidate for cystectomy or systemic chemotherapy, but he may benefit from immunotherapy treatment using Tecentriq.  CT scan results from November 28, 2018  reviewed independently and reported as above revealing no obvious evidence of metastatic disease outside patient's bladder.  Port placement was previously discussed, but is not necessary at this time.  If patient IV access becomes difficult in the future, can reconsider this option.  Proceed with cycle 1 of Tecentriq today.  Return to clinic in 3 weeks for further evaluation and consideration of cycle 2. 2.  Thrombocytopenia: Mild, monitor. 3.  Elevated bilirubin: Monitor.   Patient expressed understanding and was in agreement with this plan. He also understands that He can call clinic at any time with any questions, concerns, or complaints.   Cancer Staging No matching staging information was found for the patient.  Lloyd Huger,  MD   12/02/2018 1:01 PM

## 2018-12-02 ENCOUNTER — Encounter: Payer: Self-pay | Admitting: Oncology

## 2018-12-02 ENCOUNTER — Other Ambulatory Visit: Payer: Self-pay

## 2018-12-02 ENCOUNTER — Inpatient Hospital Stay: Payer: Medicare Other | Attending: Oncology

## 2018-12-02 ENCOUNTER — Inpatient Hospital Stay (HOSPITAL_BASED_OUTPATIENT_CLINIC_OR_DEPARTMENT_OTHER): Payer: Medicare Other | Admitting: Oncology

## 2018-12-02 ENCOUNTER — Inpatient Hospital Stay: Payer: Medicare Other

## 2018-12-02 VITALS — BP 101/67 | HR 75 | Temp 97.7°F | Ht 72.0 in | Wt 200.0 lb

## 2018-12-02 DIAGNOSIS — C679 Malignant neoplasm of bladder, unspecified: Secondary | ICD-10-CM

## 2018-12-02 DIAGNOSIS — Z5112 Encounter for antineoplastic immunotherapy: Secondary | ICD-10-CM | POA: Diagnosis not present

## 2018-12-02 DIAGNOSIS — Z79899 Other long term (current) drug therapy: Secondary | ICD-10-CM | POA: Insufficient documentation

## 2018-12-02 LAB — COMPREHENSIVE METABOLIC PANEL
ALT: 14 U/L (ref 0–44)
AST: 21 U/L (ref 15–41)
Albumin: 3.7 g/dL (ref 3.5–5.0)
Alkaline Phosphatase: 77 U/L (ref 38–126)
Anion gap: 9 (ref 5–15)
BUN: 22 mg/dL (ref 8–23)
CO2: 24 mmol/L (ref 22–32)
Calcium: 9.1 mg/dL (ref 8.9–10.3)
Chloride: 105 mmol/L (ref 98–111)
Creatinine, Ser: 1.24 mg/dL (ref 0.61–1.24)
GFR calc Af Amer: >60 mL/min (ref >60)
GFR calc non Af Amer: 55 mL/min — ABNORMAL LOW (ref >60)
Glucose, Bld: 99 mg/dL (ref 70–99)
Potassium: 4.7 mmol/L (ref 3.5–5.1)
Sodium: 138 mmol/L (ref 135–145)
Total Bilirubin: 1.4 mg/dL — ABNORMAL HIGH (ref 0.3–1.2)
Total Protein: 7.2 g/dL (ref 6.5–8.1)

## 2018-12-02 LAB — CBC WITH DIFFERENTIAL/PLATELET
Abs Immature Granulocytes: 0.01 10*3/uL (ref 0.00–0.07)
Basophils Absolute: 0 10*3/uL (ref 0.0–0.1)
Basophils Relative: 0 %
Eosinophils Absolute: 0.1 10*3/uL (ref 0.0–0.5)
Eosinophils Relative: 2 %
HCT: 43.3 % (ref 39.0–52.0)
Hemoglobin: 14.6 g/dL (ref 13.0–17.0)
Immature Granulocytes: 0 %
Lymphocytes Relative: 12 %
Lymphs Abs: 0.8 10*3/uL (ref 0.7–4.0)
MCH: 31.7 pg (ref 26.0–34.0)
MCHC: 33.7 g/dL (ref 30.0–36.0)
MCV: 94.1 fL (ref 80.0–100.0)
Monocytes Absolute: 0.6 10*3/uL (ref 0.1–1.0)
Monocytes Relative: 9 %
Neutro Abs: 5.2 10*3/uL (ref 1.7–7.7)
Neutrophils Relative %: 77 %
Platelets: 113 10*3/uL — ABNORMAL LOW (ref 150–400)
RBC: 4.6 MIL/uL (ref 4.22–5.81)
RDW: 13.8 % (ref 11.5–15.5)
WBC: 6.7 10*3/uL (ref 4.0–10.5)
nRBC: 0 % (ref 0.0–0.2)

## 2018-12-02 MED ORDER — SODIUM CHLORIDE 0.9 % IV SOLN
1200.0000 mg | Freq: Once | INTRAVENOUS | Status: AC
  Administered 2018-12-02: 1200 mg via INTRAVENOUS
  Filled 2018-12-02: qty 20

## 2018-12-02 MED ORDER — SODIUM CHLORIDE 0.9 % IV SOLN
Freq: Once | INTRAVENOUS | Status: AC
  Administered 2018-12-02: 11:00:00 via INTRAVENOUS
  Filled 2018-12-02: qty 250

## 2018-12-02 NOTE — Progress Notes (Signed)
While RN Maudie Mercury was attempting to obtain PIV access for pt's scheduled liter of IVF pt became lightheaded and diaphoretic. BP was unobtainable at first, then 91/60. Pt placed on 2L O2 [place] and O2 sat went from 81 to 97. NP Lauren to chairside. PIV access obtained and IVF bolus initiated. EKG done per NP. Will continue to monitor for resolution of symptoms.

## 2018-12-02 NOTE — Progress Notes (Signed)
Patient stated that he had been doing well with no complaints. 

## 2018-12-03 ENCOUNTER — Telehealth: Payer: Self-pay

## 2018-12-03 NOTE — Telephone Encounter (Signed)
Telephone call to patient home and spoke to wife.  Patient received first infusion yesterday and wife states everything went well and patient is doing fine.  No complaints voiced and encouraged wife to call for any questions and concerns.  Wife thanked me for checking on patient.

## 2018-12-17 ENCOUNTER — Other Ambulatory Visit: Payer: Self-pay | Admitting: Oncology

## 2018-12-17 DIAGNOSIS — C689 Malignant neoplasm of urinary organ, unspecified: Secondary | ICD-10-CM

## 2018-12-20 NOTE — Progress Notes (Signed)
[place]  Telephone:(336) [phone] Fax:(336) [phone]  ID: Kyle Gregory OB: 11-05-1940  MR#: [id number]  [id number]  Patient Care Team: Rusty Aus, MD as PCP - General (Internal Medicine)  CHIEF COMPLAINT: Invasive bladder cancer  INTERVAL HISTORY: Patient returns to clinic today for further evaluation and consideration of cycle 2 of Tecentriq.  He tolerated his first treatment well only with some mild fatigue for several days after his infusion.  Much of the history is given by his wife given his mild dementia, but he currently feels well and is asymptomatic. He has no neurologic complaints.  He denies any recent fevers or illnesses.  He has a good appetite and denies weight loss.  He denies any chest pain, shortness of breath, cough, or hemoptysis.  He denies any nausea, vomiting, constipation, or diarrhea.  He has no urinary complaints.  He denies hematuria.  Patient offers no specific complaints today.  REVIEW OF SYSTEMS:   Review of Systems  Constitutional: Negative.  Negative for fever, malaise/fatigue and weight loss.  Respiratory: Negative.  Negative for cough and shortness of breath.   Cardiovascular: Negative.  Negative for chest pain and leg swelling.  Gastrointestinal: Negative.  Negative for abdominal pain.  Genitourinary: Negative.  Negative for dysuria and hematuria.  Musculoskeletal: Negative.  Negative for back pain.  Skin: Negative.  Negative for rash.  Neurological: Negative.  Negative for focal weakness, weakness and headaches.  Psychiatric/Behavioral: Positive for memory loss. The patient is not nervous/anxious.     As per HPI. Otherwise, a complete review of systems is negative.  PAST MEDICAL HISTORY: Past Medical History:  Diagnosis Date  . Anemia   . Aortic stenosis   . Arrhythmia   . Atrial fibrillation ([place])   . Bladder cancer ([place])   . Broken skin    buttocks  . Cardiomyopathy ([place])   . CHF (congestive heart failure)  ([place])   . COPD (chronic obstructive pulmonary disease) ([place])   . Coronary artery disease   . Dementia ([place])   . Dysrhythmia   . Elevated lipids   . GERD (gastroesophageal reflux disease)   . Heartburn   . Lower extremity edema   . Presence of permanent cardiac pacemaker   . Stroke ([place])    x 2 after heart surgery  . VRE (vancomycin resistant enterococcus) culture positive     PAST SURGICAL HISTORY: Past Surgical History:  Procedure Laterality Date  . CARDIAC VALVE REPLACEMENT    . 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 CYSTOSCOPY W/ RETROGRADES Bilateral 08/14/2016   Procedure: CYSTOSCOPY WITH RETROGRADE PYELOGRAM;  Surgeon: Hollice Espy, MD;  Location: [facility];  Service: Urology;  Laterality: Bilateral;  . LEFT HEART CATH AND CORONARY ANGIOGRAPHY Left 09/05/2016   Procedure: Left Heart Cath and Coronary Angiography;  Surgeon: Teodoro Spray, MD;  Location: [place] [facility];  Service: Cardiovascular;  Laterality: Left;  . neck growth removal     1970's  . PACEMAKER INSERTION N/A 06/24/2018   Procedure: INSERTION PACEMAKER;  Surgeon: Isaias Cowman, MD;  Location: [facility];  Service: Cardiovascular;  Laterality: N/A;  . TEMPORARY PACEMAKER N/A 06/22/2018   Procedure: TEMPORARY PACEMAKER;  Surgeon: Yolonda Kida, MD;  Location: [place] [facility];  Service: Cardiovascular;  Laterality: N/A;  . TONSILLECTOMY    . TRANSURETHRAL RESECTION OF BLADDER TUMOR N/A 08/14/2016   Procedure: TRANSURETHRAL RESECTION OF BLADDER TUMOR (TURBT);  Surgeon: Hollice Espy, MD;  Location: [facility];  Service: Urology;  Laterality: N/A;  . TRANSURETHRAL RESECTION OF BLADDER TUMOR N/A 01/25/2018   Procedure: TRANSURETHRAL RESECTION OF BLADDER TUMOR (TURBT);  Surgeon: Billey Co, MD;  Location: [facility];  Service: Urology;  Laterality: N/A;  . TRANSURETHRAL RESECTION OF BLADDER TUMOR N/A 11/06/2018   Procedure: TRANSURETHRAL RESECTION OF BLADDER  TUMOR (TURBT);  Surgeon: Billey Co, MD;  Location: [facility];  Service: Urology;  Laterality: N/A;    FAMILY HISTORY: Family History  Problem Relation Age of Onset  . Heart failure Father   . Heart failure Brother   . Atrial fibrillation Sister   . Prostate cancer Neg Hx   . Kidney cancer Neg Hx   . Bladder Cancer Neg Hx     ADVANCED DIRECTIVES (Y/N):  N  HEALTH MAINTENANCE: Social History   Tobacco Use  . Smoking status: Never Smoker  . Smokeless tobacco: Never Used  Substance Use Topics  . Alcohol use: No  . Drug use: No     Colonoscopy:  PAP:  Bone density:  Lipid panel:  Allergies  Allergen Reactions  . Amiodarone Other (See Comments)    Dizziness  . Donepezil Palpitations    Severe bradycardia    Current Outpatient Medications  Medication Sig Dispense Refill  . apixaban (ELIQUIS) 5 MG TABS tablet Take 1 tablet (5 mg total) by mouth 2 (two) times daily. 60 tablet 0  . Calcium Carbonate-Vitamin D (OYSTER SHELL CALCIUM 500 + D) 500-125 MG-UNIT TABS Take 1 tablet by mouth daily.     . ferrous sulfate 325 (65 FE) MG tablet Take 325 mg by mouth 2 (two) times daily with a meal.    . FLUoxetine (PROZAC) 20 MG capsule Take 20 mg by mouth daily.     . furosemide (LASIX) 20 MG tablet Take 20 mg by mouth daily as needed (swelling feet/ankles.).     Marland Kitchen Melatonin 3 MG TABS Take 3 mg by mouth at bedtime.     . nitroGLYCERIN (NITROSTAT) 0.4 MG SL tablet Place 0.4 mg under the tongue every 5 (five) minutes as needed for chest pain (Up to 3 doses).     Marland Kitchen omeprazole (PRILOSEC) 20 MG capsule Take 20 mg by mouth daily as needed (Heartburn or acid reflux).     . pravastatin (PRAVACHOL) 40 MG tablet Take 40 mg by mouth at bedtime.   11  . prochlorperazine (COMPAZINE) 10 MG tablet Take 1 tablet (10 mg total) by mouth every 6 (six) hours as needed (Nausea or vomiting). 60 tablet 2  . spironolactone (ALDACTONE) 25 MG tablet Take 25 mg by mouth daily.     Marland Kitchen zinc oxide (CVS ZINC  OXIDE) 20 % ointment Apply 1 application topically 2 (two) times daily as needed for irritation.      No current facility-administered medications for this visit.     OBJECTIVE: Vitals:   12/23/18 1058  BP: 108/69  Pulse: 80  Temp: (!) 97.2 F (36.2 C)     Body mass index is 27.12 kg/m.    ECOG FS:0 - Asymptomatic  General: Well-developed, well-nourished, no acute distress. Eyes: Pink conjunctiva, anicteric sclera. HEENT: Normocephalic, moist mucous membranes. Lungs: Clear to auscultation bilaterally. Heart: Regular rate and rhythm. No rubs, murmurs, or gallops. Abdomen: Soft, nontender, nondistended. No organomegaly noted, normoactive bowel sounds. Musculoskeletal: No edema, cyanosis, or clubbing. Neuro: Alert. Cranial nerves grossly intact. Skin: No rashes or petechiae noted. Psych: Normal affect.  LAB RESULTS:  Lab Results  Component  Value Date   NA 138 12/23/2018   K 5.1 12/23/2018   CL 104 12/23/2018   CO2 25 12/23/2018   GLUCOSE 97 12/23/2018   BUN 28 (H) 12/23/2018   CREATININE 1.38 (H) 12/23/2018   CALCIUM 9.2 12/23/2018   PROT 7.9 12/23/2018   ALBUMIN 3.9 12/23/2018   AST 22 12/23/2018   ALT 16 12/23/2018   ALKPHOS 82 12/23/2018   BILITOT 1.5 (H) 12/23/2018   GFRNONAA 49 (L) 12/23/2018   GFRAA 56 (L) 12/23/2018    Lab Results  Component Value Date   WBC 6.9 12/23/2018   NEUTROABS 5.1 12/23/2018   HGB 14.7 12/23/2018   HCT 43.9 12/23/2018   MCV 94.0 12/23/2018   PLT 122 (L) 12/23/2018     STUDIES: Ct Abdomen Pelvis W Contrast  Result Date: 11/28/2018 CLINICAL DATA:  Follow-up bladder carcinoma. EXAM: CT ABDOMEN AND PELVIS WITH CONTRAST TECHNIQUE: Multidetector CT imaging of the abdomen and pelvis was performed using the standard protocol following bolus administration of intravenous contrast. CONTRAST:  140mL OMNIPAQUE IOHEXOL 300 MG/ML  SOLN COMPARISON:  12/20/2017 FINDINGS: Lower Chest: No acute findings. Hepatobiliary: No hepatic masses  identified. Numerous tiny gallstones are again seen, however there is no evidence of cholecystitis or biliary ductal dilatation. Pancreas:  No mass or inflammatory changes. Spleen: Within normal limits in size and appearance. Adrenals/Urinary Tract: No adrenal or renal masses identified. Benign-appearing renal cysts are again seen bilaterally. No evidence of hydronephrosis. Asymmetric soft tissue thickening is seen along right lateral bladder wall which is similar to prior exam. There is also a focal area of wall thickening along the left lateral bladder wall which appears increased since prior exam. These areas are suspicious for recurrent or residual bladder carcinoma. Stomach/Bowel: No evidence of obstruction, inflammatory process or abnormal fluid collections. Vascular/Lymphatic: No pathologically enlarged lymph nodes. No abdominal aortic aneurysm. Aortic atherosclerosis. Reproductive:  No mass or other significant abnormality. Other:  None. Musculoskeletal:  No suspicious bone lesions identified. IMPRESSION: 1. Stable asymmetric soft tissue thickening along the right lateral bladder wall, and increased focal wall thickening along the left lateral bladder wall, suspicious for recurrent or residual bladder carcinoma. 2. No evidence of abdominal or pelvic metastatic disease. 3. Cholelithiasis. No radiographic evidence of cholecystitis. Aortic Atherosclerosis (ICD10-I70.0). Electronically Signed   By: Marlaine Hind M.D.   On: 11/28/2018 16:28    ASSESSMENT: Invasive bladder cancer  PLAN:    1.  Invasive bladder cancer: Patient was diagnosed with a high-grade noninvasive bladder cancer on January 25, 2018.  He received 6 infusions of intravesicular gemcitabine at that time.  More recently cystoscopy revealed invasive disease.  Patient is not a candidate for cystectomy or systemic chemotherapy, but he may benefit from immunotherapy treatment using Tecentriq.  CT scan results from November 28, 2018 reviewed  independently and reported as above revealing no obvious evidence of metastatic disease outside patient's bladder.  Port placement was previously discussed, but is not necessary at this time.  If patient IV access becomes difficult in the future, can reconsider this option.  Proceed with cycle 2 of Tecentriq today.  Return to clinic in 3 weeks for further evaluation and consideration of cycle 3.  2.  Thrombocytopenia: Chronic and unchanged, monitor. 3.  Elevated bilirubin: Bilirubin 1.5 today.  This is chronic and unchanged.  Monitor. 4.  Chronic renal insufficiency: Patient's creatinine is trended up slightly to 1.38, monitor.   Patient expressed understanding and was in agreement with this plan. He also understands that He  can call clinic at any time with any questions, concerns, or complaints.   Cancer Staging No matching staging information was found for the patient.  Lloyd Huger, MD   12/23/2018 3:30 PM

## 2018-12-23 ENCOUNTER — Inpatient Hospital Stay: Payer: Medicare Other

## 2018-12-23 ENCOUNTER — Encounter: Payer: Self-pay | Admitting: Oncology

## 2018-12-23 ENCOUNTER — Inpatient Hospital Stay (HOSPITAL_BASED_OUTPATIENT_CLINIC_OR_DEPARTMENT_OTHER): Payer: Medicare Other | Admitting: Oncology

## 2018-12-23 ENCOUNTER — Other Ambulatory Visit: Payer: Self-pay

## 2018-12-23 VITALS — BP 108/69 | HR 80 | Temp 97.2°F | Ht 72.0 in | Wt 200.0 lb

## 2018-12-23 DIAGNOSIS — C679 Malignant neoplasm of bladder, unspecified: Secondary | ICD-10-CM

## 2018-12-23 DIAGNOSIS — Z5112 Encounter for antineoplastic immunotherapy: Secondary | ICD-10-CM | POA: Diagnosis not present

## 2018-12-23 DIAGNOSIS — C689 Malignant neoplasm of urinary organ, unspecified: Secondary | ICD-10-CM

## 2018-12-23 LAB — CBC WITH DIFFERENTIAL/PLATELET
Abs Immature Granulocytes: 0.01 10*3/uL (ref 0.00–0.07)
Basophils Absolute: 0.1 10*3/uL (ref 0.0–0.1)
Basophils Relative: 1 %
Eosinophils Absolute: 0.3 10*3/uL (ref 0.0–0.5)
Eosinophils Relative: 4 %
HCT: 43.9 % (ref 39.0–52.0)
Hemoglobin: 14.7 g/dL (ref 13.0–17.0)
Immature Granulocytes: 0 %
Lymphocytes Relative: 13 %
Lymphs Abs: 0.9 10*3/uL (ref 0.7–4.0)
MCH: 31.5 pg (ref 26.0–34.0)
MCHC: 33.5 g/dL (ref 30.0–36.0)
MCV: 94 fL (ref 80.0–100.0)
Monocytes Absolute: 0.6 10*3/uL (ref 0.1–1.0)
Monocytes Relative: 9 %
Neutro Abs: 5.1 10*3/uL (ref 1.7–7.7)
Neutrophils Relative %: 73 %
Platelets: 122 10*3/uL — ABNORMAL LOW (ref 150–400)
RBC: 4.67 MIL/uL (ref 4.22–5.81)
RDW: 13.6 % (ref 11.5–15.5)
WBC: 6.9 10*3/uL (ref 4.0–10.5)
nRBC: 0 % (ref 0.0–0.2)

## 2018-12-23 LAB — COMPREHENSIVE METABOLIC PANEL
ALT: 16 U/L (ref 0–44)
AST: 22 U/L (ref 15–41)
Albumin: 3.9 g/dL (ref 3.5–5.0)
Alkaline Phosphatase: 82 U/L (ref 38–126)
Anion gap: 9 (ref 5–15)
BUN: 28 mg/dL — ABNORMAL HIGH (ref 8–23)
CO2: 25 mmol/L (ref 22–32)
Calcium: 9.2 mg/dL (ref 8.9–10.3)
Chloride: 104 mmol/L (ref 98–111)
Creatinine, Ser: 1.38 mg/dL — ABNORMAL HIGH (ref 0.61–1.24)
GFR calc Af Amer: 56 mL/min — ABNORMAL LOW (ref >60)
GFR calc non Af Amer: 49 mL/min — ABNORMAL LOW (ref >60)
Glucose, Bld: 97 mg/dL (ref 70–99)
Potassium: 5.1 mmol/L (ref 3.5–5.1)
Sodium: 138 mmol/L (ref 135–145)
Total Bilirubin: 1.5 mg/dL — ABNORMAL HIGH (ref 0.3–1.2)
Total Protein: 7.9 g/dL (ref 6.5–8.1)

## 2018-12-23 MED ORDER — SODIUM CHLORIDE 0.9 % IV SOLN
1200.0000 mg | Freq: Once | INTRAVENOUS | Status: AC
  Administered 2018-12-23: 12:00:00 1200 mg via INTRAVENOUS
  Filled 2018-12-23: qty 20

## 2018-12-23 MED ORDER — SODIUM CHLORIDE 0.9 % IV SOLN
Freq: Once | INTRAVENOUS | Status: AC
  Administered 2018-12-23: 12:00:00 via INTRAVENOUS
  Filled 2018-12-23: qty 250

## 2018-12-23 NOTE — Progress Notes (Signed)
Patient stated that he had been doing well with no complaints. 

## 2018-12-24 LAB — THYROID PANEL WITH TSH
Free Thyroxine Index: 2.1 (ref 1.2–4.9)
T3 Uptake Ratio: 29 % (ref 24–39)
T4, Total: 7.1 ug/dL (ref 4.5–12.0)
TSH: 1.65 u[IU]/mL (ref 0.450–4.500)

## 2019-01-03 NOTE — Progress Notes (Signed)
[place]  Telephone:(336) [phone] Fax:(336) [phone]  ID: Kyle Gregory OB: [birth year]  MR#: [id number]  [id number]  Patient Care Team: Rusty Aus, MD as PCP - General (Internal Medicine)  CHIEF COMPLAINT: Invasive bladder cancer  INTERVAL HISTORY: Patient returns to clinic today for further evaluation and consideration of cycle 3 of Tecentriq.  He continues to tolerate his treatments well only with some mild fatigue 1 to 2 days after his infusion.  He currently feels well and is asymptomatic.  Much of the history is given by his wife given his underlying dementia.  He has no neurologic complaints.  He denies any recent fevers or illnesses.  He has a good appetite and denies weight loss.  He denies any chest pain, shortness of breath, cough, or hemoptysis.  He denies any nausea, vomiting, constipation, or diarrhea.  He has no urinary complaints.  He denies hematuria.  Patient offers no specific complaints today.  REVIEW OF SYSTEMS:   Review of Systems  Constitutional: Negative.  Negative for fever, malaise/fatigue and weight loss.  Respiratory: Negative.  Negative for cough and shortness of breath.   Cardiovascular: Negative.  Negative for chest pain and leg swelling.  Gastrointestinal: Negative.  Negative for abdominal pain.  Genitourinary: Negative.  Negative for dysuria and hematuria.  Musculoskeletal: Negative.  Negative for back pain.  Skin: Negative.  Negative for rash.  Neurological: Negative.  Negative for focal weakness, weakness and headaches.  Psychiatric/Behavioral: Positive for memory loss. The patient is not nervous/anxious.     As per HPI. Otherwise, a complete review of systems is negative.  PAST MEDICAL HISTORY: Past Medical History:  Diagnosis Date  . Anemia   . Aortic stenosis   . Arrhythmia   . Atrial fibrillation ([place])   . Bladder cancer ([place])   . Broken skin    buttocks  . Cardiomyopathy ([place])   . CHF (congestive heart  failure) ([place])   . COPD (chronic obstructive pulmonary disease) ([place])   . Coronary artery disease   . Dementia ([place])   . Dysrhythmia   . Elevated lipids   . GERD (gastroesophageal reflux disease)   . Heartburn   . Lower extremity edema   . Presence of permanent cardiac pacemaker   . Stroke ([place])    x 2 after heart surgery  . VRE (vancomycin resistant enterococcus) culture positive     PAST SURGICAL HISTORY: Past Surgical History:  Procedure Laterality Date  . CARDIAC VALVE REPLACEMENT    . 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 CYSTOSCOPY W/ RETROGRADES Bilateral 08/14/2016   Procedure: CYSTOSCOPY WITH RETROGRADE PYELOGRAM;  Surgeon: Hollice Espy, MD;  Location: [facility];  Service: Urology;  Laterality: Bilateral;  . LEFT HEART CATH AND CORONARY ANGIOGRAPHY Left 09/05/2016   Procedure: Left Heart Cath and Coronary Angiography;  Surgeon: Teodoro Spray, MD;  Location: [place] [facility];  Service: Cardiovascular;  Laterality: Left;  . neck growth removal     1970's  . PACEMAKER INSERTION N/A 06/24/2018   Procedure: INSERTION PACEMAKER;  Surgeon: Isaias Cowman, MD;  Location: [facility];  Service: Cardiovascular;  Laterality: N/A;  . TEMPORARY PACEMAKER N/A 06/22/2018   Procedure: TEMPORARY PACEMAKER;  Surgeon: Yolonda Kida, MD;  Location: [place] [facility];  Service: Cardiovascular;  Laterality: N/A;  . TONSILLECTOMY    . TRANSURETHRAL RESECTION OF BLADDER TUMOR N/A 08/14/2016   Procedure: TRANSURETHRAL RESECTION OF BLADDER TUMOR (TURBT);  Surgeon: Caryl Pina  Erlene Quan, MD;  Location: [facility];  Service: Urology;  Laterality: N/A;  . TRANSURETHRAL RESECTION OF BLADDER TUMOR N/A 01/25/2018   Procedure: TRANSURETHRAL RESECTION OF BLADDER TUMOR (TURBT);  Surgeon: Billey Co, MD;  Location: [facility];  Service: Urology;  Laterality: N/A;  . TRANSURETHRAL RESECTION OF BLADDER TUMOR N/A 11/06/2018   Procedure: TRANSURETHRAL RESECTION OF  BLADDER TUMOR (TURBT);  Surgeon: Billey Co, MD;  Location: [facility];  Service: Urology;  Laterality: N/A;    FAMILY HISTORY: Family History  Problem Relation Age of Onset  . Heart failure Father   . Heart failure Brother   . Atrial fibrillation Sister   . Prostate cancer Neg Hx   . Kidney cancer Neg Hx   . Bladder Cancer Neg Hx     ADVANCED DIRECTIVES (Y/N):  N  HEALTH MAINTENANCE: Social History   Tobacco Use  . Smoking status: Never Smoker  . Smokeless tobacco: Never Used  Substance Use Topics  . Alcohol use: No  . Drug use: No     Colonoscopy:  PAP:  Bone density:  Lipid panel:  Allergies  Allergen Reactions  . Amiodarone Other (See Comments)    Dizziness  . Donepezil Palpitations    Severe bradycardia    Current Outpatient Medications  Medication Sig Dispense Refill  . apixaban (ELIQUIS) 5 MG TABS tablet Take 1 tablet (5 mg total) by mouth 2 (two) times daily. 60 tablet 0  . Calcium Carbonate-Vitamin D (OYSTER SHELL CALCIUM 500 + D) 500-125 MG-UNIT TABS Take 1 tablet by mouth daily.     . ferrous sulfate 325 (65 FE) MG tablet Take 325 mg by mouth 2 (two) times daily with a meal.    . FLUoxetine (PROZAC) 20 MG capsule Take 20 mg by mouth daily.     . furosemide (LASIX) 20 MG tablet Take 20 mg by mouth daily as needed (swelling feet/ankles.).     Marland Kitchen Melatonin 3 MG TABS Take 3 mg by mouth at bedtime.     . nitroGLYCERIN (NITROSTAT) 0.4 MG SL tablet Place 0.4 mg under the tongue every 5 (five) minutes as needed for chest pain (Up to 3 doses).     Marland Kitchen omeprazole (PRILOSEC) 20 MG capsule Take 20 mg by mouth daily as needed (Heartburn or acid reflux).     . pravastatin (PRAVACHOL) 40 MG tablet Take 40 mg by mouth at bedtime.   11  . prochlorperazine (COMPAZINE) 10 MG tablet Take 1 tablet (10 mg total) by mouth every 6 (six) hours as needed (Nausea or vomiting). 60 tablet 2  . spironolactone (ALDACTONE) 25 MG tablet Take 25 mg by mouth daily.     Marland Kitchen zinc oxide ([employer]  ZINC OXIDE) 20 % ointment Apply 1 application topically 2 (two) times daily as needed for irritation.      No current facility-administered medications for this visit.     OBJECTIVE: Vitals:   01/13/19 1006  BP: 116/75  Pulse: 71  Temp: 97.9 F (36.6 C)     Body mass index is 27.4 kg/m.    ECOG FS:0 - Asymptomatic  General: Well-developed, well-nourished, no acute distress. Eyes: Pink conjunctiva, anicteric sclera. HEENT: Normocephalic, moist mucous membranes. Lungs: Clear to auscultation bilaterally. Heart: Regular rate and rhythm. No rubs, murmurs, or gallops. Abdomen: Soft, nontender, nondistended. No organomegaly noted, normoactive bowel sounds. Musculoskeletal: No edema, cyanosis, or clubbing. Neuro: Alert, answering all questions appropriately. Cranial nerves grossly intact. Skin: No rashes or petechiae noted. Psych: Normal affect.  LAB  RESULTS:  Lab Results  Component Value Date   NA 136 01/13/2019   K 4.9 01/13/2019   CL 103 01/13/2019   CO2 25 01/13/2019   GLUCOSE 95 01/13/2019   BUN 26 (H) 01/13/2019   CREATININE 1.26 (H) 01/13/2019   CALCIUM 9.0 01/13/2019   PROT 7.7 01/13/2019   ALBUMIN 3.8 01/13/2019   AST 23 01/13/2019   ALT 15 01/13/2019   ALKPHOS 73 01/13/2019   BILITOT 1.4 (H) 01/13/2019   GFRNONAA 54 (L) 01/13/2019   GFRAA >60 01/13/2019    Lab Results  Component Value Date   WBC 7.1 01/13/2019   NEUTROABS 5.0 01/13/2019   HGB 14.6 01/13/2019   HCT 44.1 01/13/2019   MCV 95.2 01/13/2019   PLT 111 (L) 01/13/2019     STUDIES: No results found.  ASSESSMENT: Invasive bladder cancer  PLAN:    1.  Invasive bladder cancer: Patient was diagnosed with a high-grade noninvasive bladder cancer on January 25, 2018.  He received 6 infusions of intravesicular gemcitabine at that time.  More recently cystoscopy revealed invasive disease.  Patient is not a candidate for cystectomy or systemic chemotherapy, but he may benefit from immunotherapy  treatment using Tecentriq.  CT scan results from November 28, 2018 reviewed independently and reported as above revealing no obvious evidence of metastatic disease outside patient's bladder.  Port placement was previously discussed, but is not necessary at this time.  If patient IV access becomes difficult in the future, can reconsider this option.  Proceed with cycle 3 of Tecentriq today.  Return to clinic in 3 weeks for further evaluation and consideration of cycle 4.   2.  Thrombocytopenia: Chronic and unchanged, monitor.  Platelets 111 today. 3.  Elevated bilirubin: Chronic and unchanged.  Bilirubin is 1.4 today.   4.  Chronic renal insufficiency: Creatinine mildly improved to 1.26 today.  Patient expressed understanding and was in agreement with this plan. He also understands that He can call clinic at any time with any questions, concerns, or complaints.   Cancer Staging No matching staging information was found for the patient.  Lloyd Huger, MD   01/13/2019 3:28 PM

## 2019-01-10 ENCOUNTER — Other Ambulatory Visit: Payer: Self-pay

## 2019-01-10 ENCOUNTER — Encounter: Payer: Self-pay | Admitting: Oncology

## 2019-01-10 NOTE — Progress Notes (Signed)
Patient unable to answer secondary to dementia dx. Wife provided information.

## 2019-01-13 ENCOUNTER — Inpatient Hospital Stay: Payer: Medicare Other | Attending: Oncology

## 2019-01-13 ENCOUNTER — Other Ambulatory Visit: Payer: Self-pay

## 2019-01-13 ENCOUNTER — Inpatient Hospital Stay: Payer: Medicare Other

## 2019-01-13 ENCOUNTER — Encounter: Payer: Self-pay | Admitting: Oncology

## 2019-01-13 ENCOUNTER — Inpatient Hospital Stay (HOSPITAL_BASED_OUTPATIENT_CLINIC_OR_DEPARTMENT_OTHER): Payer: Medicare Other | Admitting: Oncology

## 2019-01-13 VITALS — BP 116/75 | HR 71 | Temp 97.9°F | Ht 72.0 in | Wt 202.0 lb

## 2019-01-13 DIAGNOSIS — C689 Malignant neoplasm of urinary organ, unspecified: Secondary | ICD-10-CM

## 2019-01-13 DIAGNOSIS — Z5112 Encounter for antineoplastic immunotherapy: Secondary | ICD-10-CM | POA: Diagnosis not present

## 2019-01-13 DIAGNOSIS — C679 Malignant neoplasm of bladder, unspecified: Secondary | ICD-10-CM

## 2019-01-13 DIAGNOSIS — Z79899 Other long term (current) drug therapy: Secondary | ICD-10-CM | POA: Diagnosis not present

## 2019-01-13 LAB — COMPREHENSIVE METABOLIC PANEL
ALT: 15 U/L (ref 0–44)
AST: 23 U/L (ref 15–41)
Albumin: 3.8 g/dL (ref 3.5–5.0)
Alkaline Phosphatase: 73 U/L (ref 38–126)
Anion gap: 8 (ref 5–15)
BUN: 26 mg/dL — ABNORMAL HIGH (ref 8–23)
CO2: 25 mmol/L (ref 22–32)
Calcium: 9 mg/dL (ref 8.9–10.3)
Chloride: 103 mmol/L (ref 98–111)
Creatinine, Ser: 1.26 mg/dL — ABNORMAL HIGH (ref 0.61–1.24)
GFR calc Af Amer: >60 mL/min (ref >60)
GFR calc non Af Amer: 54 mL/min — ABNORMAL LOW (ref >60)
Glucose, Bld: 95 mg/dL (ref 70–99)
Potassium: 4.9 mmol/L (ref 3.5–5.1)
Sodium: 136 mmol/L (ref 135–145)
Total Bilirubin: 1.4 mg/dL — ABNORMAL HIGH (ref 0.3–1.2)
Total Protein: 7.7 g/dL (ref 6.5–8.1)

## 2019-01-13 LAB — CBC WITH DIFFERENTIAL/PLATELET
Abs Immature Granulocytes: 0.02 10*3/uL (ref 0.00–0.07)
Basophils Absolute: 0.1 10*3/uL (ref 0.0–0.1)
Basophils Relative: 1 %
Eosinophils Absolute: 0.4 10*3/uL (ref 0.0–0.5)
Eosinophils Relative: 5 %
HCT: 44.1 % (ref 39.0–52.0)
Hemoglobin: 14.6 g/dL (ref 13.0–17.0)
Immature Granulocytes: 0 %
Lymphocytes Relative: 13 %
Lymphs Abs: 0.9 10*3/uL (ref 0.7–4.0)
MCH: 31.5 pg (ref 26.0–34.0)
MCHC: 33.1 g/dL (ref 30.0–36.0)
MCV: 95.2 fL (ref 80.0–100.0)
Monocytes Absolute: 0.7 10*3/uL (ref 0.1–1.0)
Monocytes Relative: 10 %
Neutro Abs: 5 10*3/uL (ref 1.7–7.7)
Neutrophils Relative %: 71 %
Platelets: 111 10*3/uL — ABNORMAL LOW (ref 150–400)
RBC: 4.63 MIL/uL (ref 4.22–5.81)
RDW: 13.5 % (ref 11.5–15.5)
WBC: 7.1 10*3/uL (ref 4.0–10.5)
nRBC: 0 % (ref 0.0–0.2)

## 2019-01-13 MED ORDER — SODIUM CHLORIDE 0.9 % IV SOLN
1200.0000 mg | Freq: Once | INTRAVENOUS | Status: AC
  Administered 2019-01-13: 13:00:00 1200 mg via INTRAVENOUS
  Filled 2019-01-13: qty 20

## 2019-01-13 MED ORDER — SODIUM CHLORIDE 0.9 % IV SOLN
Freq: Once | INTRAVENOUS | Status: AC
  Administered 2019-01-13: 12:00:00 via INTRAVENOUS
  Filled 2019-01-13: qty 250

## 2019-01-13 NOTE — Progress Notes (Signed)
Patient stated that he has left hip pain.

## 2019-01-14 LAB — THYROID PANEL WITH TSH
Free Thyroxine Index: 1.9 (ref 1.2–4.9)
T3 Uptake Ratio: 27 % (ref 24–39)
T4, Total: 7.1 ug/dL (ref 4.5–12.0)
TSH: 2.13 u[IU]/mL (ref 0.450–4.500)

## 2019-01-30 ENCOUNTER — Other Ambulatory Visit: Payer: Self-pay

## 2019-01-30 ENCOUNTER — Ambulatory Visit (INDEPENDENT_AMBULATORY_CARE_PROVIDER_SITE_OTHER): Payer: Medicare Other | Admitting: Podiatry

## 2019-01-30 ENCOUNTER — Encounter: Payer: Self-pay | Admitting: Podiatry

## 2019-01-30 DIAGNOSIS — M79675 Pain in left toe(s): Secondary | ICD-10-CM

## 2019-01-30 DIAGNOSIS — B351 Tinea unguium: Secondary | ICD-10-CM

## 2019-01-30 DIAGNOSIS — M79674 Pain in right toe(s): Secondary | ICD-10-CM

## 2019-01-30 NOTE — Progress Notes (Signed)
Complaint:  Visit Type: Patient presents  to my office for continued preventative foot care services. Complaint: Patient states" my nails have grown long and thick and become painful to walk and wear shoes".  Patient presents to the office with his wife.   The patient presents for preventative foot care services.  Podiatric Exam: Vascular: dorsalis pedis  are palpable bilateral. Posterior tibial pulses are absent  B/L Capillary return is immediate. Temperature gradient is WNL. Skin turgor WNL  Sensorium: Diminished  Semmes Weinstein monofilament test. Normal tactile sensation bilaterally. Nail Exam: Pt has thick disfigured discolored nails with subungual debris noted bilateral entire nail hallux through fifth toenails Ulcer Exam: There is no evidence of ulcer or pre-ulcerative changes or infection. Orthopedic Exam: Muscle tone and strength are WNL. No limitations in general ROM. No crepitus or effusions noted. Foot type and digits show no abnormalities. Bony prominences are unremarkable. Skin: No Porokeratosis. No infection or ulcers  Diagnosis:  Onychomycosis, , Pain in right toe, pain in left toes  Treatment & Plan Procedures and Treatment: Consent by patient was obtained for treatment procedures.   Debridement of mycotic and hypertrophic toenails, 1 through 5 bilateral and clearing of subungual debris. No ulceration, no infection noted.  Return Visit-Office Procedure: Patient instructed to return to the office for a follow up visit 10 weeks  for continued evaluation and treatment.    Gardiner Barefoot DPM

## 2019-01-31 ENCOUNTER — Other Ambulatory Visit: Payer: Self-pay

## 2019-01-31 ENCOUNTER — Encounter: Payer: Self-pay | Admitting: Oncology

## 2019-02-03 ENCOUNTER — Other Ambulatory Visit: Payer: Self-pay

## 2019-02-03 ENCOUNTER — Inpatient Hospital Stay: Payer: Medicare Other

## 2019-02-03 ENCOUNTER — Inpatient Hospital Stay: Payer: Medicare Other | Attending: Oncology | Admitting: Oncology

## 2019-02-03 VITALS — BP 111/69 | HR 73 | Temp 96.0°F | Resp 18 | Wt 207.3 lb

## 2019-02-03 DIAGNOSIS — Z23 Encounter for immunization: Secondary | ICD-10-CM | POA: Insufficient documentation

## 2019-02-03 DIAGNOSIS — C679 Malignant neoplasm of bladder, unspecified: Secondary | ICD-10-CM | POA: Insufficient documentation

## 2019-02-03 DIAGNOSIS — C689 Malignant neoplasm of urinary organ, unspecified: Secondary | ICD-10-CM

## 2019-02-03 DIAGNOSIS — Z79899 Other long term (current) drug therapy: Secondary | ICD-10-CM | POA: Diagnosis not present

## 2019-02-03 DIAGNOSIS — Z5112 Encounter for antineoplastic immunotherapy: Secondary | ICD-10-CM | POA: Insufficient documentation

## 2019-02-03 LAB — COMPREHENSIVE METABOLIC PANEL
ALT: 20 U/L (ref 0–44)
AST: 27 U/L (ref 15–41)
Albumin: 4.1 g/dL (ref 3.5–5.0)
Alkaline Phosphatase: 78 U/L (ref 38–126)
Anion gap: 9 (ref 5–15)
BUN: 24 mg/dL — ABNORMAL HIGH (ref 8–23)
CO2: 24 mmol/L (ref 22–32)
Calcium: 8.9 mg/dL (ref 8.9–10.3)
Chloride: 103 mmol/L (ref 98–111)
Creatinine, Ser: 1.19 mg/dL (ref 0.61–1.24)
GFR calc Af Amer: >60 mL/min (ref >60)
GFR calc non Af Amer: 58 mL/min — ABNORMAL LOW (ref >60)
Glucose, Bld: 93 mg/dL (ref 70–99)
Potassium: 4.5 mmol/L (ref 3.5–5.1)
Sodium: 136 mmol/L (ref 135–145)
Total Bilirubin: 1.6 mg/dL — ABNORMAL HIGH (ref 0.3–1.2)
Total Protein: 8.1 g/dL (ref 6.5–8.1)

## 2019-02-03 LAB — URINALYSIS, COMPLETE (UACMP) WITH MICROSCOPIC
Bilirubin Urine: NEGATIVE
Glucose, UA: NEGATIVE mg/dL
Ketones, ur: NEGATIVE mg/dL
Nitrite: NEGATIVE
Protein, ur: NEGATIVE mg/dL
Specific Gravity, Urine: 1.015 (ref 1.005–1.030)
pH: 5 (ref 5.0–8.0)

## 2019-02-03 LAB — CBC WITH DIFFERENTIAL/PLATELET
Abs Immature Granulocytes: 0.01 10*3/uL (ref 0.00–0.07)
Basophils Absolute: 0 10*3/uL (ref 0.0–0.1)
Basophils Relative: 1 %
Eosinophils Absolute: 0.3 10*3/uL (ref 0.0–0.5)
Eosinophils Relative: 4 %
HCT: 45.3 % (ref 39.0–52.0)
Hemoglobin: 15.2 g/dL (ref 13.0–17.0)
Immature Granulocytes: 0 %
Lymphocytes Relative: 13 %
Lymphs Abs: 0.7 10*3/uL (ref 0.7–4.0)
MCH: 31.5 pg (ref 26.0–34.0)
MCHC: 33.6 g/dL (ref 30.0–36.0)
MCV: 93.8 fL (ref 80.0–100.0)
Monocytes Absolute: 0.6 10*3/uL (ref 0.1–1.0)
Monocytes Relative: 10 %
Neutro Abs: 4.3 10*3/uL (ref 1.7–7.7)
Neutrophils Relative %: 72 %
Platelets: 109 10*3/uL — ABNORMAL LOW (ref 150–400)
RBC: 4.83 MIL/uL (ref 4.22–5.81)
RDW: 13.6 % (ref 11.5–15.5)
WBC: 5.9 10*3/uL (ref 4.0–10.5)
nRBC: 0 % (ref 0.0–0.2)

## 2019-02-03 MED ORDER — SODIUM CHLORIDE 0.9 % IV SOLN
Freq: Once | INTRAVENOUS | Status: AC
  Administered 2019-02-03: 11:00:00 via INTRAVENOUS
  Filled 2019-02-03: qty 250

## 2019-02-03 MED ORDER — SODIUM CHLORIDE 0.9 % IV SOLN
1200.0000 mg | Freq: Once | INTRAVENOUS | Status: AC
  Administered 2019-02-03: 11:00:00 1200 mg via INTRAVENOUS
  Filled 2019-02-03: qty 20

## 2019-02-03 NOTE — Progress Notes (Signed)
Labs reviewed over with Sonia Baller, NP. Verbal order to continue with treatment today.   Ric Rosenberg [employer]

## 2019-02-03 NOTE — Progress Notes (Signed)
[place]  Telephone:(336) [phone] Fax:(336) [phone]  ID: [name] OB: 1940/06/27  MR#: [id number]  [id number]  Patient Care Team: Rusty Aus, MD as PCP - General (Internal Medicine)  CHIEF COMPLAINT: Invasive bladder cancer  INTERVAL HISTORY: Patient returns to clinic today for further evaluation and consideration of cycle 3 of Tecentriq.  He continues to tolerate his treatments well with only mild fatigue a few days after his infusions.  He has significant dementia.  He states he overall feels well.  Uses a walker for all ADLs.  Appetite is good.  He denies any recent fevers or illness, weight loss, chest pain, shortness of breath, cough or hemoptysis.  He denies any nausea, vomiting, constipation or diarrhea.  He denies any urinary complaints.  REVIEW OF SYSTEMS:   Review of Systems  Constitutional: Positive for malaise/fatigue.       Wheelchair  Neurological: Positive for weakness.  Psychiatric/Behavioral: Positive for memory loss.    As per HPI. Otherwise, a complete review of systems is negative.  PAST MEDICAL HISTORY: Past Medical History:  Diagnosis Date  . Anemia   . Aortic stenosis   . Arrhythmia   . Atrial fibrillation ([place])   . Bladder cancer ([place])   . Broken skin    buttocks  . Cardiomyopathy ([place])   . CHF (congestive heart failure) ([place])   . COPD (chronic obstructive pulmonary disease) ([place])   . Coronary artery disease   . Dementia ([place])   . Dysrhythmia   . Elevated lipids   . GERD (gastroesophageal reflux disease)   . Heartburn   . Lower extremity edema   . Presence of permanent cardiac pacemaker   . Stroke ([place])    x 2 after heart surgery  . VRE (vancomycin resistant enterococcus) culture positive     PAST SURGICAL HISTORY: Past Surgical History:  Procedure Laterality Date  . CARDIAC VALVE REPLACEMENT    . 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 CYSTOSCOPY W/ RETROGRADES  Bilateral 08/14/2016   Procedure: CYSTOSCOPY WITH RETROGRADE PYELOGRAM;  Surgeon: Hollice Espy, MD;  Location: [facility];  Service: Urology;  Laterality: Bilateral;  . LEFT HEART CATH AND CORONARY ANGIOGRAPHY Left 09/05/2016   Procedure: Left Heart Cath and Coronary Angiography;  Surgeon: Teodoro Spray, MD;  Location: [place] [facility];  Service: Cardiovascular;  Laterality: Left;  . neck growth removal     1970's  . PACEMAKER INSERTION N/A 06/24/2018   Procedure: INSERTION PACEMAKER;  Surgeon: Isaias Cowman, MD;  Location: [facility];  Service: Cardiovascular;  Laterality: N/A;  . TEMPORARY PACEMAKER N/A 06/22/2018   Procedure: TEMPORARY PACEMAKER;  Surgeon: Yolonda Kida, MD;  Location: [place] [facility];  Service: Cardiovascular;  Laterality: N/A;  . TONSILLECTOMY    . TRANSURETHRAL RESECTION OF BLADDER TUMOR N/A 08/14/2016   Procedure: TRANSURETHRAL RESECTION OF BLADDER TUMOR (TURBT);  Surgeon: Hollice Espy, MD;  Location: [facility];  Service: Urology;  Laterality: N/A;  . TRANSURETHRAL RESECTION OF BLADDER TUMOR N/A 01/25/2018   Procedure: TRANSURETHRAL RESECTION OF BLADDER TUMOR (TURBT);  Surgeon: Billey Co, MD;  Location: [facility];  Service: Urology;  Laterality: N/A;  . TRANSURETHRAL RESECTION OF BLADDER TUMOR N/A 11/06/2018   Procedure: TRANSURETHRAL RESECTION OF BLADDER TUMOR (TURBT);  Surgeon: Billey Co, MD;  Location: [facility];  Service: Urology;  Laterality: N/A;    FAMILY HISTORY: Family History  Problem Relation Age of Onset  . Heart  failure Father   . Heart failure Brother   . Atrial fibrillation Sister   . Prostate cancer Neg Hx   . Kidney cancer Neg Hx   . Bladder Cancer Neg Hx     ADVANCED DIRECTIVES (Y/N):  N  HEALTH MAINTENANCE: Social History   Tobacco Use  . Smoking status: Never Smoker  . Smokeless tobacco: Never Used  Substance Use Topics  . Alcohol use: No  . Drug use: No     Colonoscopy:  PAP:  Bone density:  Lipid  panel:  Allergies  Allergen Reactions  . Amiodarone Other (See Comments)    Dizziness  . Donepezil Palpitations    Severe bradycardia    Current Outpatient Medications  Medication Sig Dispense Refill  . apixaban (ELIQUIS) 5 MG TABS tablet Take 1 tablet (5 mg total) by mouth 2 (two) times daily. 60 tablet 0  . Calcium Carbonate-Vitamin D (OYSTER SHELL CALCIUM 500 + D) 500-125 MG-UNIT TABS Take 1 tablet by mouth daily.     . ferrous sulfate 325 (65 FE) MG tablet Take 325 mg by mouth 2 (two) times daily with a meal.    . FLUoxetine (PROZAC) 20 MG capsule Take 20 mg by mouth daily.     . furosemide (LASIX) 20 MG tablet Take 20 mg by mouth daily as needed (swelling feet/ankles.).     Marland Kitchen Melatonin 3 MG TABS Take 3 mg by mouth at bedtime.     . nitroGLYCERIN (NITROSTAT) 0.4 MG SL tablet Place 0.4 mg under the tongue every 5 (five) minutes as needed for chest pain (Up to 3 doses).     Marland Kitchen omeprazole (PRILOSEC) 20 MG capsule Take 20 mg by mouth daily as needed (Heartburn or acid reflux).     . pravastatin (PRAVACHOL) 40 MG tablet Take 40 mg by mouth at bedtime.   11  . spironolactone (ALDACTONE) 25 MG tablet Take 25 mg by mouth daily.     . prochlorperazine (COMPAZINE) 10 MG tablet Take 1 tablet (10 mg total) by mouth every 6 (six) hours as needed (Nausea or vomiting). (Patient not taking: Reported on 01/31/2019) 60 tablet 2  . zinc oxide ([employer] ZINC OXIDE) 20 % ointment Apply 1 application topically 2 (two) times daily as needed for irritation.      No current facility-administered medications for this visit.     OBJECTIVE: Vitals:   02/03/19 0935  BP: 111/69  Pulse: 73  Resp: 18  Temp: (!) 96 F (35.6 C)  SpO2: 100%     Body mass index is 28.11 kg/m.    ECOG FS:0 - Asymptomatic  Physical Exam Constitutional:      Appearance: Normal appearance.     Comments: Sitting in wheelchair.  HENT:     Head: Normocephalic and atraumatic.  Eyes:     Pupils: Pupils are equal, round, and reactive  to light.  Neck:     Musculoskeletal: Normal range of motion.  Cardiovascular:     Rate and Rhythm: Normal rate and regular rhythm.     Heart sounds: Normal heart sounds. No murmur.  Pulmonary:     Effort: Pulmonary effort is normal.     Breath sounds: Normal breath sounds. No wheezing.  Abdominal:     General: Bowel sounds are normal. There is no distension.     Palpations: Abdomen is soft.     Tenderness: There is no abdominal tenderness.  Musculoskeletal: Normal range of motion.  Skin:    General: Skin is warm and  dry.     Findings: No rash.  Neurological:     Mental Status: He is alert and oriented to person, place, and time.  Psychiatric:        Judgment: Judgment normal.    LAB RESULTS:  Lab Results  Component Value Date   NA 136 01/13/2019   K 4.9 01/13/2019   CL 103 01/13/2019   CO2 25 01/13/2019   GLUCOSE 95 01/13/2019   BUN 26 (H) 01/13/2019   CREATININE 1.26 (H) 01/13/2019   CALCIUM 9.0 01/13/2019   PROT 7.7 01/13/2019   ALBUMIN 3.8 01/13/2019   AST 23 01/13/2019   ALT 15 01/13/2019   ALKPHOS 73 01/13/2019   BILITOT 1.4 (H) 01/13/2019   GFRNONAA 54 (L) 01/13/2019   GFRAA >60 01/13/2019    Lab Results  Component Value Date   WBC 5.9 02/03/2019   NEUTROABS 4.3 02/03/2019   HGB 15.2 02/03/2019   HCT 45.3 02/03/2019   MCV 93.8 02/03/2019   PLT 109 (L) 02/03/2019     STUDIES: No results found.  ASSESSMENT: Invasive bladder cancer  PLAN:    1.  Invasive bladder cancer: Patient was diagnosed with a high-grade noninvasive bladder cancer on January 25, 2018.  He received 6 infusions of intravesicular gemcitabine at that time.  More recently cystoscopy revealed invasive disease.  Patient is not a candidate for cystectomy or systemic chemotherapy, but he may benefit from immunotherapy treatment using Tecentriq.  CT scan results from November 28, 2018 reviewed independently and reported as above revealing no obvious evidence of metastatic disease outside  patient's bladder.  Port placement was previously discussed, but is not necessary at this time.  If patient IV access becomes difficult in the future, can reconsider this option.  Proceed with cycle 4 of Tecentriq today.  Return to clinic in 3 weeks for further evaluation and consideration of cycle 5.   2.  Thrombocytopenia: Chronic and unchanged, monitor.  Platelets 109 today. 3.  Elevated bilirubin: Chronic and unchanged.  Bilirubin is 1.6 today.   4.  Chronic renal insufficiency: Creatinine improved and is 1.19 today.  Patient expressed understanding and was in agreement with this plan. He also understands that He can call clinic at any time with any questions, concerns, or complaints.   Cancer Staging No matching staging information was found for the patient.  Jacquelin Hawking, NP   02/03/2019 9:39 AM

## 2019-02-04 LAB — THYROID PANEL WITH TSH
Free Thyroxine Index: 2 (ref 1.2–4.9)
T3 Uptake Ratio: 29 % (ref 24–39)
T4, Total: 7 ug/dL (ref 4.5–12.0)
TSH: 1.87 u[IU]/mL (ref 0.450–4.500)

## 2019-02-10 ENCOUNTER — Other Ambulatory Visit: Payer: Self-pay | Admitting: Oncology

## 2019-02-17 ENCOUNTER — Ambulatory Visit: Payer: Medicare Other | Admitting: Oncology

## 2019-02-17 ENCOUNTER — Telehealth: Payer: Self-pay | Admitting: *Deleted

## 2019-02-17 ENCOUNTER — Ambulatory Visit: Payer: Medicare Other

## 2019-02-17 ENCOUNTER — Other Ambulatory Visit: Payer: Medicare Other

## 2019-02-17 NOTE — Telephone Encounter (Signed)
Yes that is fine

## 2019-02-17 NOTE — Telephone Encounter (Signed)
Note added to upcoming appointment to give flu vaccine. Wife informed

## 2019-02-17 NOTE — Telephone Encounter (Signed)
Wife called requesting if patient can get a flu shot, he has an appointment for treatment. Please advise

## 2019-02-23 NOTE — Progress Notes (Signed)
[facility]  [phone]) [phone] Fax:(336) [phone]  ID: Kyle Gregory OB: [birth year]  MR#: [id number]  [id number]  Patient Care Team: Kyle Aus, MD as PCP - General (Internal Medicine)  CHIEF COMPLAINT: Invasive bladder cancer  INTERVAL HISTORY: Patient returns to clinic today for further evaluation and consideration of cycle 4 of Tecentriq.  He continues to tolerate his treatments well without significant side effects.  Much of the history is given by his wife given his underlying dementia.  He has no neurologic complaints.  He denies any recent fevers or illnesses.  He has a good appetite and denies weight loss.  He denies any chest pain, shortness of breath, cough, or hemoptysis.  He denies any nausea, vomiting, constipation, or diarrhea.  He has no urinary complaints.  He denies hematuria.  Patient offers no specific complaints today.  REVIEW OF SYSTEMS:   Review of Systems  Constitutional: Negative.  Negative for fever, malaise/fatigue and weight loss.  Respiratory: Negative.  Negative for cough and shortness of breath.   Cardiovascular: Negative.  Negative for chest pain and leg swelling.  Gastrointestinal: Negative.  Negative for abdominal pain.  Genitourinary: Negative.  Negative for dysuria and hematuria.  Musculoskeletal: Negative.  Negative for back pain.  Skin: Positive for rash.  Neurological: Negative.  Negative for focal weakness, weakness and headaches.  Psychiatric/Behavioral: Positive for memory loss. The patient is not nervous/anxious.     As per HPI. Otherwise, a complete review of systems is negative.  PAST MEDICAL HISTORY: Past Medical History:  Diagnosis Date  . Anemia   . Aortic stenosis   . Arrhythmia   . Atrial fibrillation ([place])   . Bladder cancer ([place])   . Broken skin    buttocks  . Cardiomyopathy ([place])   . CHF (congestive heart failure) ([place])   . COPD (chronic obstructive pulmonary disease) ([place])   . Coronary  artery disease   . Dementia ([place])   . Dysrhythmia   . Elevated lipids   . GERD (gastroesophageal reflux disease)   . Heartburn   . Lower extremity edema   . Presence of permanent cardiac pacemaker   . Stroke ([place])    x 2 after heart surgery  . VRE (vancomycin resistant enterococcus) culture positive     PAST SURGICAL HISTORY: Past Surgical History:  Procedure Laterality Date  . CARDIAC VALVE REPLACEMENT    . 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 CYSTOSCOPY W/ RETROGRADES Bilateral 08/14/2016   Procedure: CYSTOSCOPY WITH RETROGRADE PYELOGRAM;  Surgeon: Hollice Espy, MD;  Location: [facility];  Service: Urology;  Laterality: Bilateral;  . LEFT HEART CATH AND CORONARY ANGIOGRAPHY Left 09/05/2016   Procedure: Left Heart Cath and Coronary Angiography;  Surgeon: Teodoro Spray, MD;  Location: [facility];  Service: Cardiovascular;  Laterality: Left;  . neck growth removal     1970's  . PACEMAKER INSERTION N/A 06/24/2018   Procedure: INSERTION PACEMAKER;  Surgeon: Isaias Cowman, MD;  Location: [facility];  Service: Cardiovascular;  Laterality: N/A;  . TEMPORARY PACEMAKER N/A 06/22/2018   Procedure: TEMPORARY PACEMAKER;  Surgeon: Yolonda Kida, MD;  Location: [facility];  Service: Cardiovascular;  Laterality: N/A;  . TONSILLECTOMY    . TRANSURETHRAL RESECTION OF BLADDER TUMOR N/A 08/14/2016   Procedure: TRANSURETHRAL RESECTION OF BLADDER TUMOR (TURBT);  Surgeon: Hollice Espy, MD;  Location: [facility];  Service: Urology;  Laterality: N/A;  . TRANSURETHRAL RESECTION OF BLADDER  TUMOR N/A 01/25/2018   Procedure: TRANSURETHRAL RESECTION OF BLADDER TUMOR (TURBT);  Surgeon: Billey Co, MD;  Location: [facility];  Service: Urology;  Laterality: N/A;  . TRANSURETHRAL RESECTION OF BLADDER TUMOR N/A 11/06/2018   Procedure: TRANSURETHRAL RESECTION OF BLADDER TUMOR (TURBT);  Surgeon: Billey Co, MD;  Location: [facility];  Service:  Urology;  Laterality: N/A;    FAMILY HISTORY: Family History  Problem Relation Age of Onset  . Heart failure Father   . Heart failure Brother   . Atrial fibrillation Sister   . Prostate cancer Neg Hx   . Kidney cancer Neg Hx   . Bladder Cancer Neg Hx     ADVANCED DIRECTIVES (Y/N):  N  HEALTH MAINTENANCE: Social History   Tobacco Use  . Smoking status: Never Smoker  . Smokeless tobacco: Never Used  Substance Use Topics  . Alcohol use: No  . Drug use: No     Colonoscopy:  PAP:  Bone density:  Lipid panel:  Allergies  Allergen Reactions  . Amiodarone Other (See Comments)    Dizziness  . Donepezil Palpitations    Severe bradycardia    Current Outpatient Medications  Medication Sig Dispense Refill  . apixaban (ELIQUIS) 5 MG TABS tablet Take 1 tablet (5 mg total) by mouth 2 (two) times daily. 60 tablet 0  . Calcium Carbonate-Vitamin D (OYSTER SHELL CALCIUM 500 + D) 500-125 MG-UNIT TABS Take 1 tablet by mouth daily.     . ferrous sulfate 325 (65 FE) MG tablet Take 325 mg by mouth 2 (two) times daily with a meal.    . FLUoxetine (PROZAC) 20 MG capsule Take 20 mg by mouth daily.     . furosemide (LASIX) 20 MG tablet Take 20 mg by mouth daily as needed (swelling feet/ankles.).     Marland Kitchen Melatonin 3 MG TABS Take 3 mg by mouth at bedtime.     . nitroGLYCERIN (NITROSTAT) 0.4 MG SL tablet Place 0.4 mg under the tongue every 5 (five) minutes as needed for chest pain (Up to 3 doses).     Marland Kitchen omeprazole (PRILOSEC) 20 MG capsule Take 20 mg by mouth daily as needed (Heartburn or acid reflux).     . pravastatin (PRAVACHOL) 40 MG tablet Take 40 mg by mouth at bedtime.   11  . spironolactone (ALDACTONE) 25 MG tablet Take 25 mg by mouth daily.     Marland Kitchen zinc oxide ([employer] ZINC OXIDE) 20 % ointment Apply 1 application topically 2 (two) times daily as needed for irritation.     . prochlorperazine (COMPAZINE) 10 MG tablet Take 1 tablet (10 mg total) by mouth every 6 (six) hours as needed (Nausea or  vomiting). (Patient not taking: Reported on 01/31/2019) 60 tablet 2   No current facility-administered medications for this visit.     OBJECTIVE: Vitals:   02/24/19 1049  BP: 124/81  Pulse: 71  Temp: (!) 94.8 F (34.9 C)     Body mass index is 27.26 kg/m.    ECOG FS:0 - Asymptomatic  General: Well-developed, well-nourished, no acute distress. Eyes: Pink conjunctiva, anicteric sclera. HEENT: Normocephalic, moist mucous membranes. Lungs: Clear to auscultation bilaterally. Heart: Regular rate and rhythm. No rubs, murmurs, or gallops. Abdomen: Soft, nontender, nondistended. No organomegaly noted, normoactive bowel sounds. Musculoskeletal: No edema, cyanosis, or clubbing. Neuro: Alert, answering all questions appropriately. Cranial nerves grossly intact. Skin: No rashes or petechiae noted. Psych: Normal affect.  LAB RESULTS:  Lab Results  Component Value Date   NA  135 02/24/2019   K 4.6 02/24/2019   CL 103 02/24/2019   CO2 25 02/24/2019   GLUCOSE 74 02/24/2019   BUN 29 (H) 02/24/2019   CREATININE 1.38 (H) 02/24/2019   CALCIUM 9.3 02/24/2019   PROT 8.0 02/24/2019   ALBUMIN 4.1 02/24/2019   AST 24 02/24/2019   ALT 16 02/24/2019   ALKPHOS 78 02/24/2019   BILITOT 1.5 (H) 02/24/2019   GFRNONAA 49 (L) 02/24/2019   GFRAA 56 (L) 02/24/2019    Lab Results  Component Value Date   WBC 7.0 02/24/2019   NEUTROABS 5.0 02/24/2019   HGB 15.8 02/24/2019   HCT 47.4 02/24/2019   MCV 96.0 02/24/2019   PLT 99 (L) 02/24/2019     STUDIES: No results found.  ASSESSMENT: Invasive bladder cancer  PLAN:    1.  Invasive bladder cancer: Patient was diagnosed with a high-grade noninvasive bladder cancer on January 25, 2018.  He received 6 infusions of intravesicular gemcitabine at that time.  More recently cystoscopy revealed invasive disease.  Patient is not a candidate for cystectomy or systemic chemotherapy, but he may benefit from immunotherapy treatment using Tecentriq.  CT scan  results from November 28, 2018 reviewed independently and reported as above revealing no obvious evidence of metastatic disease outside patient's bladder.  Port placement was previously discussed, but is not necessary at this time.  If patient IV access becomes difficult in the future, can reconsider this option.  Proceed with cycle 4 of Tecentriq today.  Return to clinic in 3 weeks for further evaluation and consideration of cycle 5. 2.  Thrombocytopenia: Platelets have trended down slightly to 99.  Proceed with treatment as above. 3.  Elevated bilirubin: Chronic and unchanged.  Bilirubin is 1.5 today. 4.  Chronic renal insufficiency: Creatinine is trended up slightly to 1.38.  Monitor. 5.  Rash: Okay to proceed with OTC treatments.  Patient expressed understanding and was in agreement with this plan. He also understands that He can call clinic at any time with any questions, concerns, or complaints.     Lloyd Huger, MD   02/24/2019 3:27 PM

## 2019-02-24 ENCOUNTER — Other Ambulatory Visit: Payer: Self-pay

## 2019-02-24 ENCOUNTER — Inpatient Hospital Stay: Payer: Medicare Other

## 2019-02-24 ENCOUNTER — Inpatient Hospital Stay (HOSPITAL_BASED_OUTPATIENT_CLINIC_OR_DEPARTMENT_OTHER): Payer: Medicare Other | Admitting: Oncology

## 2019-02-24 ENCOUNTER — Encounter: Payer: Self-pay | Admitting: Oncology

## 2019-02-24 VITALS — BP 124/81 | HR 71 | Temp 94.8°F | Wt 201.0 lb

## 2019-02-24 DIAGNOSIS — C679 Malignant neoplasm of bladder, unspecified: Secondary | ICD-10-CM

## 2019-02-24 DIAGNOSIS — Z5112 Encounter for antineoplastic immunotherapy: Secondary | ICD-10-CM | POA: Diagnosis not present

## 2019-02-24 DIAGNOSIS — Z23 Encounter for immunization: Secondary | ICD-10-CM | POA: Diagnosis not present

## 2019-02-24 DIAGNOSIS — C689 Malignant neoplasm of urinary organ, unspecified: Secondary | ICD-10-CM

## 2019-02-24 LAB — CBC WITH DIFFERENTIAL/PLATELET
Abs Immature Granulocytes: 0.01 10*3/uL (ref 0.00–0.07)
Basophils Absolute: 0.1 10*3/uL (ref 0.0–0.1)
Basophils Relative: 1 %
Eosinophils Absolute: 0.3 10*3/uL (ref 0.0–0.5)
Eosinophils Relative: 5 %
HCT: 47.4 % (ref 39.0–52.0)
Hemoglobin: 15.8 g/dL (ref 13.0–17.0)
Immature Granulocytes: 0 %
Lymphocytes Relative: 13 %
Lymphs Abs: 0.9 10*3/uL (ref 0.7–4.0)
MCH: 32 pg (ref 26.0–34.0)
MCHC: 33.3 g/dL (ref 30.0–36.0)
MCV: 96 fL (ref 80.0–100.0)
Monocytes Absolute: 0.7 10*3/uL (ref 0.1–1.0)
Monocytes Relative: 11 %
Neutro Abs: 5 10*3/uL (ref 1.7–7.7)
Neutrophils Relative %: 70 %
Platelets: 99 10*3/uL — ABNORMAL LOW (ref 150–400)
RBC: 4.94 MIL/uL (ref 4.22–5.81)
RDW: 13.3 % (ref 11.5–15.5)
WBC: 7 10*3/uL (ref 4.0–10.5)
nRBC: 0 % (ref 0.0–0.2)

## 2019-02-24 LAB — COMPREHENSIVE METABOLIC PANEL
ALT: 16 U/L (ref 0–44)
AST: 24 U/L (ref 15–41)
Albumin: 4.1 g/dL (ref 3.5–5.0)
Alkaline Phosphatase: 78 U/L (ref 38–126)
Anion gap: 7 (ref 5–15)
BUN: 29 mg/dL — ABNORMAL HIGH (ref 8–23)
CO2: 25 mmol/L (ref 22–32)
Calcium: 9.3 mg/dL (ref 8.9–10.3)
Chloride: 103 mmol/L (ref 98–111)
Creatinine, Ser: 1.38 mg/dL — ABNORMAL HIGH (ref 0.61–1.24)
GFR calc Af Amer: 56 mL/min — ABNORMAL LOW (ref >60)
GFR calc non Af Amer: 49 mL/min — ABNORMAL LOW (ref >60)
Glucose, Bld: 74 mg/dL (ref 70–99)
Potassium: 4.6 mmol/L (ref 3.5–5.1)
Sodium: 135 mmol/L (ref 135–145)
Total Bilirubin: 1.5 mg/dL — ABNORMAL HIGH (ref 0.3–1.2)
Total Protein: 8 g/dL (ref 6.5–8.1)

## 2019-02-24 MED ORDER — SODIUM CHLORIDE 0.9 % IV SOLN
Freq: Once | INTRAVENOUS | Status: AC
  Administered 2019-02-24: 11:00:00 via INTRAVENOUS
  Filled 2019-02-24: qty 250

## 2019-02-24 MED ORDER — SODIUM CHLORIDE 0.9 % IV SOLN
1200.0000 mg | Freq: Once | INTRAVENOUS | Status: AC
  Administered 2019-02-24: 12:00:00 1200 mg via INTRAVENOUS
  Filled 2019-02-24: qty 20

## 2019-02-24 MED ORDER — INFLUENZA VAC A&B SA ADJ QUAD 0.5 ML IM PRSY
0.5000 mL | PREFILLED_SYRINGE | Freq: Once | INTRAMUSCULAR | Status: AC
  Administered 2019-02-24: 0.5 mL via INTRAMUSCULAR

## 2019-02-24 NOTE — Progress Notes (Signed)
Okay to proceed with treatment today with platelet count of 99K and bili of 1.5 per Almyra Free per Dr. Grayland Ormond.

## 2019-02-24 NOTE — Progress Notes (Signed)
Patient here today for follow up and possible chemotherapy.  Called patients wife for chart review.  Wife denies pt having any nausea, vomiting, diarrhea, constipation or pain.  Wife says pt has a rash on his right shoulder asking what he can use for it.

## 2019-02-25 LAB — THYROID PANEL WITH TSH
Free Thyroxine Index: 2.4 (ref 1.2–4.9)
T3 Uptake Ratio: 32 % (ref 24–39)
T4, Total: 7.5 ug/dL (ref 4.5–12.0)
TSH: 1.93 u[IU]/mL (ref 0.450–4.500)

## 2019-03-14 ENCOUNTER — Other Ambulatory Visit: Payer: Self-pay

## 2019-03-14 NOTE — Progress Notes (Signed)
Patient pre screened for office appointment, no questions or concerns today. Patient reminded of upcoming appointment time and date. Patient has dementia, caregiver gave information.

## 2019-03-14 NOTE — Progress Notes (Signed)
[facility]  Telephone:(336) [phone] Fax:(336) [phone]  ID: Kyle Gregory OB: May 01, 1941  MR#: [id number]  [id number]  Patient Care Team: Rusty Aus, MD as PCP - General (Internal Medicine)  CHIEF COMPLAINT: Invasive bladder cancer  INTERVAL HISTORY: Patient returns to clinic today for further evaluation and consideration of cycle 5 of Tecentriq.  He continues to tolerate his treatments well without significant side effects.  He is a poor historian given his underlying dementia.  He has no neurologic complaints.  He denies any recent fevers or illnesses.  He has a good appetite and denies weight loss.  He denies any chest pain, shortness of breath, cough, or hemoptysis.  He denies any nausea, vomiting, constipation, or diarrhea.  He has no urinary complaints.  He denies hematuria.  Patient offers no specific complaints today.  REVIEW OF SYSTEMS:   Review of Systems  Constitutional: Negative.  Negative for fever, malaise/fatigue and weight loss.  Respiratory: Negative.  Negative for cough and shortness of breath.   Cardiovascular: Negative.  Negative for chest pain and leg swelling.  Gastrointestinal: Negative.  Negative for abdominal pain.  Genitourinary: Negative.  Negative for dysuria and hematuria.  Musculoskeletal: Negative.  Negative for back pain.  Skin: Negative.  Negative for rash.  Neurological: Negative.  Negative for focal weakness, weakness and headaches.  Psychiatric/Behavioral: Positive for memory loss. The patient is not nervous/anxious.     As per HPI. Otherwise, a complete review of systems is negative.  PAST MEDICAL HISTORY: Past Medical History:  Diagnosis Date  . Anemia   . Aortic stenosis   . Arrhythmia   . Atrial fibrillation ([place])   . Bladder cancer ([place])   . Broken skin    buttocks  . Cardiomyopathy ([place])   . CHF (congestive heart failure) ([place])   . COPD (chronic obstructive pulmonary disease) ([place])   . Coronary artery  disease   . Dementia ([place])   . Dysrhythmia   . Elevated lipids   . GERD (gastroesophageal reflux disease)   . Heartburn   . Lower extremity edema   . Presence of permanent cardiac pacemaker   . Stroke ([place])    x 2 after heart surgery  . VRE (vancomycin resistant enterococcus) culture positive     PAST SURGICAL HISTORY: Past Surgical History:  Procedure Laterality Date  . CARDIAC VALVE REPLACEMENT    . 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 CYSTOSCOPY W/ RETROGRADES Bilateral 08/14/2016   Procedure: CYSTOSCOPY WITH RETROGRADE PYELOGRAM;  Surgeon: Hollice Espy, MD;  Location: [facility];  Service: Urology;  Laterality: Bilateral;  . LEFT HEART CATH AND CORONARY ANGIOGRAPHY Left 09/05/2016   Procedure: Left Heart Cath and Coronary Angiography;  Surgeon: Teodoro Spray, MD;  Location: [facility];  Service: Cardiovascular;  Laterality: Left;  . neck growth removal     1970's  . PACEMAKER INSERTION N/A 06/24/2018   Procedure: INSERTION PACEMAKER;  Surgeon: Isaias Cowman, MD;  Location: [facility];  Service: Cardiovascular;  Laterality: N/A;  . TEMPORARY PACEMAKER N/A 06/22/2018   Procedure: TEMPORARY PACEMAKER;  Surgeon: Yolonda Kida, MD;  Location: [facility];  Service: Cardiovascular;  Laterality: N/A;  . TONSILLECTOMY    . TRANSURETHRAL RESECTION OF BLADDER TUMOR N/A 08/14/2016   Procedure: TRANSURETHRAL RESECTION OF BLADDER TUMOR (TURBT);  Surgeon: Hollice Espy, MD;  Location: [facility];  Service: Urology;  Laterality: N/A;  . TRANSURETHRAL RESECTION OF BLADDER TUMOR N/A  01/25/2018   Procedure: TRANSURETHRAL RESECTION OF BLADDER TUMOR (TURBT);  Surgeon: Billey Co, MD;  Location: [facility];  Service: Urology;  Laterality: N/A;  . TRANSURETHRAL RESECTION OF BLADDER TUMOR N/A 11/06/2018   Procedure: TRANSURETHRAL RESECTION OF BLADDER TUMOR (TURBT);  Surgeon: Billey Co, MD;  Location: [facility];  Service:  Urology;  Laterality: N/A;    FAMILY HISTORY: Family History  Problem Relation Age of Onset  . Heart failure Father   . Heart failure Brother   . Atrial fibrillation Sister   . Prostate cancer Neg Hx   . Kidney cancer Neg Hx   . Bladder Cancer Neg Hx     ADVANCED DIRECTIVES (Y/N):  N  HEALTH MAINTENANCE: Social History   Tobacco Use  . Smoking status: Never Smoker  . Smokeless tobacco: Never Used  Substance Use Topics  . Alcohol use: No  . Drug use: No     Colonoscopy:  PAP:  Bone density:  Lipid panel:  Allergies  Allergen Reactions  . Amiodarone Other (See Comments)    Dizziness  . Donepezil Palpitations    Severe bradycardia    Current Outpatient Medications  Medication Sig Dispense Refill  . apixaban (ELIQUIS) 5 MG TABS tablet Take 1 tablet (5 mg total) by mouth 2 (two) times daily. 60 tablet 0  . Calcium Carbonate-Vitamin D (OYSTER SHELL CALCIUM 500 + D) 500-125 MG-UNIT TABS Take 1 tablet by mouth daily.     . ferrous sulfate 325 (65 FE) MG tablet Take 325 mg by mouth 2 (two) times daily with a meal.    . FLUoxetine (PROZAC) 20 MG capsule Take 20 mg by mouth daily.     . furosemide (LASIX) 20 MG tablet Take 20 mg by mouth daily as needed (swelling feet/ankles.).     Marland Kitchen Melatonin 3 MG TABS Take 3 mg by mouth at bedtime.     . nitroGLYCERIN (NITROSTAT) 0.4 MG SL tablet Place 0.4 mg under the tongue every 5 (five) minutes as needed for chest pain (Up to 3 doses).     Marland Kitchen omeprazole (PRILOSEC) 20 MG capsule Take 20 mg by mouth daily as needed (Heartburn or acid reflux).     . pravastatin (PRAVACHOL) 40 MG tablet Take 40 mg by mouth at bedtime.   11  . prochlorperazine (COMPAZINE) 10 MG tablet Take 1 tablet (10 mg total) by mouth every 6 (six) hours as needed (Nausea or vomiting). 60 tablet 2  . spironolactone (ALDACTONE) 25 MG tablet Take 25 mg by mouth daily.     Marland Kitchen zinc oxide ([employer] ZINC OXIDE) 20 % ointment Apply 1 application topically 2 (two) times daily as needed  for irritation.      No current facility-administered medications for this visit.     OBJECTIVE: Vitals:   03/17/19 0944  BP: (!) 145/83  Pulse: 73  Resp: 16  Temp: (!) 95.1 F (35.1 C)  SpO2: 100%     Body mass index is 27.26 kg/m.    ECOG FS:0 - Asymptomatic  General: Well-developed, well-nourished, no acute distress.  Sitting in a wheelchair. Eyes: Pink conjunctiva, anicteric sclera. HEENT: Normocephalic, moist mucous membranes. Lungs: Clear to auscultation bilaterally. Heart: Regular rate and rhythm. No rubs, murmurs, or gallops. Abdomen: Soft, nontender, nondistended. No organomegaly noted, normoactive bowel sounds. Musculoskeletal: No edema, cyanosis, or clubbing. Neuro: Alert, mildly confused. Cranial nerves grossly intact. Skin: No rashes or petechiae noted. Psych: Normal affect.  LAB RESULTS:  Lab Results  Component Value Date  NA 134 (L) 03/17/2019   K 4.5 03/17/2019   CL 104 03/17/2019   CO2 24 03/17/2019   GLUCOSE 83 03/17/2019   BUN 28 (H) 03/17/2019   CREATININE 1.33 (H) 03/17/2019   CALCIUM 9.3 03/17/2019   PROT 7.8 03/17/2019   ALBUMIN 4.0 03/17/2019   AST 24 03/17/2019   ALT 18 03/17/2019   ALKPHOS 76 03/17/2019   BILITOT 1.5 (H) 03/17/2019   GFRNONAA 51 (L) 03/17/2019   GFRAA 59 (L) 03/17/2019    Lab Results  Component Value Date   WBC 6.9 03/17/2019   NEUTROABS 5.1 03/17/2019   HGB 15.7 03/17/2019   HCT 47.4 03/17/2019   MCV 95.4 03/17/2019   PLT 117 (L) 03/17/2019     STUDIES: No results found.  ASSESSMENT: Invasive bladder cancer  PLAN:    1.  Invasive bladder cancer: Patient was diagnosed with a high-grade noninvasive bladder cancer on January 25, 2018.  He received 6 infusions of intravesicular gemcitabine at that time.  More recently cystoscopy revealed invasive disease.  Patient is not a candidate for cystectomy or systemic chemotherapy, but he may benefit from immunotherapy treatment using Tecentriq.  CT scan results  from November 28, 2018 reviewed independently with no obvious evidence of metastatic disease outside patient's bladder.  Port placement was previously discussed, but is not necessary at this time.  If patient IV access becomes difficult in the future, can reconsider this option.  Proceed with cycle 5 of Tecentriq today.  Return to clinic in 3 weeks for further evaluation and consideration of cycle 6.  Will reimage prior to cycle 6.  If there continues to be no evidence of disease, will refer back to urology for repeat cystoscopy.   2.  Thrombocytopenia: Improved to 117.  Monitor. 3.  Elevated bilirubin: Chronic and unchanged.  Bilirubin is 1.5 today. 4.  Chronic renal insufficiency: Creatinine mildly improved to 1.33. 5.  Rash: Patient does not complain of this today.  Continue OTC treatments as needed.  Patient expressed understanding and was in agreement with this plan. He also understands that He can call clinic at any time with any questions, concerns, or complaints.     Lloyd Huger, MD   03/17/2019 11:48 AM

## 2019-03-17 ENCOUNTER — Other Ambulatory Visit: Payer: Self-pay

## 2019-03-17 ENCOUNTER — Inpatient Hospital Stay: Payer: Medicare Other

## 2019-03-17 ENCOUNTER — Inpatient Hospital Stay: Payer: Medicare Other | Attending: Oncology | Admitting: Oncology

## 2019-03-17 VITALS — BP 145/83 | HR 73 | Temp 95.1°F | Resp 16 | Wt 201.0 lb

## 2019-03-17 DIAGNOSIS — C679 Malignant neoplasm of bladder, unspecified: Secondary | ICD-10-CM

## 2019-03-17 DIAGNOSIS — Z5112 Encounter for antineoplastic immunotherapy: Secondary | ICD-10-CM | POA: Insufficient documentation

## 2019-03-17 DIAGNOSIS — C689 Malignant neoplasm of urinary organ, unspecified: Secondary | ICD-10-CM

## 2019-03-17 DIAGNOSIS — Z79899 Other long term (current) drug therapy: Secondary | ICD-10-CM | POA: Diagnosis not present

## 2019-03-17 LAB — CBC WITH DIFFERENTIAL/PLATELET
Abs Immature Granulocytes: 0.02 10*3/uL (ref 0.00–0.07)
Basophils Absolute: 0.1 10*3/uL (ref 0.0–0.1)
Basophils Relative: 1 %
Eosinophils Absolute: 0.3 10*3/uL (ref 0.0–0.5)
Eosinophils Relative: 4 %
HCT: 47.4 % (ref 39.0–52.0)
Hemoglobin: 15.7 g/dL (ref 13.0–17.0)
Immature Granulocytes: 0 %
Lymphocytes Relative: 12 %
Lymphs Abs: 0.8 10*3/uL (ref 0.7–4.0)
MCH: 31.6 pg (ref 26.0–34.0)
MCHC: 33.1 g/dL (ref 30.0–36.0)
MCV: 95.4 fL (ref 80.0–100.0)
Monocytes Absolute: 0.7 10*3/uL (ref 0.1–1.0)
Monocytes Relative: 9 %
Neutro Abs: 5.1 10*3/uL (ref 1.7–7.7)
Neutrophils Relative %: 74 %
Platelets: 117 10*3/uL — ABNORMAL LOW (ref 150–400)
RBC: 4.97 MIL/uL (ref 4.22–5.81)
RDW: 13.4 % (ref 11.5–15.5)
WBC: 6.9 10*3/uL (ref 4.0–10.5)
nRBC: 0 % (ref 0.0–0.2)

## 2019-03-17 LAB — COMPREHENSIVE METABOLIC PANEL
ALT: 18 U/L (ref 0–44)
AST: 24 U/L (ref 15–41)
Albumin: 4 g/dL (ref 3.5–5.0)
Alkaline Phosphatase: 76 U/L (ref 38–126)
Anion gap: 6 (ref 5–15)
BUN: 28 mg/dL — ABNORMAL HIGH (ref 8–23)
CO2: 24 mmol/L (ref 22–32)
Calcium: 9.3 mg/dL (ref 8.9–10.3)
Chloride: 104 mmol/L (ref 98–111)
Creatinine, Ser: 1.33 mg/dL — ABNORMAL HIGH (ref 0.61–1.24)
GFR calc Af Amer: 59 mL/min — ABNORMAL LOW (ref >60)
GFR calc non Af Amer: 51 mL/min — ABNORMAL LOW (ref >60)
Glucose, Bld: 83 mg/dL (ref 70–99)
Potassium: 4.5 mmol/L (ref 3.5–5.1)
Sodium: 134 mmol/L — ABNORMAL LOW (ref 135–145)
Total Bilirubin: 1.5 mg/dL — ABNORMAL HIGH (ref 0.3–1.2)
Total Protein: 7.8 g/dL (ref 6.5–8.1)

## 2019-03-17 MED ORDER — SODIUM CHLORIDE 0.9 % IV SOLN
Freq: Once | INTRAVENOUS | Status: AC
  Administered 2019-03-17: 11:00:00 via INTRAVENOUS
  Filled 2019-03-17: qty 250

## 2019-03-17 MED ORDER — SODIUM CHLORIDE 0.9 % IV SOLN
1200.0000 mg | Freq: Once | INTRAVENOUS | Status: AC
  Administered 2019-03-17: 11:00:00 1200 mg via INTRAVENOUS
  Filled 2019-03-17: qty 20

## 2019-03-18 LAB — THYROID PANEL WITH TSH
Free Thyroxine Index: 2.5 (ref 1.2–4.9)
T3 Uptake Ratio: 31 % (ref 24–39)
T4, Total: 8 ug/dL (ref 4.5–12.0)
TSH: 1.82 u[IU]/mL (ref 0.450–4.500)

## 2019-04-02 ENCOUNTER — Ambulatory Visit
Admission: RE | Admit: 2019-04-02 | Discharge: 2019-04-02 | Disposition: A | Discharge status: Home / Self Care | Payer: Medicare Other | Source: Ambulatory Visit | Attending: Oncology | Admitting: Oncology

## 2019-04-02 ENCOUNTER — Other Ambulatory Visit: Payer: Self-pay

## 2019-04-02 DIAGNOSIS — C679 Malignant neoplasm of bladder, unspecified: Secondary | ICD-10-CM | POA: Insufficient documentation

## 2019-04-02 MED ORDER — IOHEXOL 300 MG/ML  SOLN
75.0000 mL | Freq: Once | INTRAMUSCULAR | Status: AC | PRN
  Administered 2019-04-02: 75 mL via INTRAVENOUS

## 2019-04-04 ENCOUNTER — Other Ambulatory Visit: Payer: Self-pay

## 2019-04-04 NOTE — Progress Notes (Signed)
Patient prescreened for appointment. Patient has no concerns or questions.  

## 2019-04-06 NOTE — Progress Notes (Signed)
[place]  Telephone:([phone] Fax:(336) [phone]  ID: Kyle Gregory OB: 05/25/1940  MR#: [id number]  [id number]  Patient Care Team: Rusty Aus, MD as PCP - General (Internal Medicine)  CHIEF COMPLAINT: Invasive bladder cancer  INTERVAL HISTORY: Patient returns to clinic today for further evaluation, discussion of his imaging results, and consideration of cycle 6 of Tecentriq.  He is a poor historian given his underlying dementia and the entire history is given by his wife over the phone.  He continues to tolerate his treatments well without significant side effects.  He has no neurologic complaints.  He denies any recent fevers or illnesses.  He has a good appetite and denies weight loss.  He denies any chest pain, shortness of breath, cough, or hemoptysis.  He denies any nausea, vomiting, constipation, or diarrhea.  He has no urinary complaints.  He denies hematuria.  Patient offers no specific complaints today.  REVIEW OF SYSTEMS:   Review of Systems  Constitutional: Negative.  Negative for fever, malaise/fatigue and weight loss.  Respiratory: Negative.  Negative for cough and shortness of breath.   Cardiovascular: Negative.  Negative for chest pain and leg swelling.  Gastrointestinal: Negative.  Negative for abdominal pain.  Genitourinary: Negative.  Negative for dysuria and hematuria.  Musculoskeletal: Negative.  Negative for back pain.  Skin: Negative.  Negative for rash.  Neurological: Negative.  Negative for focal weakness, weakness and headaches.  Psychiatric/Behavioral: Positive for memory loss. The patient is not nervous/anxious.     As per HPI. Otherwise, a complete review of systems is negative.  PAST MEDICAL HISTORY: Past Medical History:  Diagnosis Date  . Anemia   . Aortic stenosis   . Arrhythmia   . Atrial fibrillation ([place])   . Bladder cancer ([place])   . Broken skin    buttocks  . Cardiomyopathy ([place])   . CHF (congestive heart  failure) ([place])   . COPD (chronic obstructive pulmonary disease) ([place])   . Coronary artery disease   . Dementia ([place])   . Dysrhythmia   . Elevated lipids   . GERD (gastroesophageal reflux disease)   . Heartburn   . Lower extremity edema   . Presence of permanent cardiac pacemaker   . Stroke ([place])    x 2 after heart surgery  . VRE (vancomycin resistant enterococcus) culture positive     PAST SURGICAL HISTORY: Past Surgical History:  Procedure Laterality Date  . CARDIAC VALVE REPLACEMENT    . 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 CYSTOSCOPY W/ RETROGRADES Bilateral 08/14/2016   Procedure: CYSTOSCOPY WITH RETROGRADE PYELOGRAM;  Surgeon: Hollice Espy, MD;  Location: [facility];  Service: Urology;  Laterality: Bilateral;  . LEFT HEART CATH AND CORONARY ANGIOGRAPHY Left 09/05/2016   Procedure: Left Heart Cath and Coronary Angiography;  Surgeon: Teodoro Spray, MD;  Location: [facility];  Service: Cardiovascular;  Laterality: Left;  . neck growth removal     1970's  . PACEMAKER INSERTION N/A 06/24/2018   Procedure: INSERTION PACEMAKER;  Surgeon: Isaias Cowman, MD;  Location: [facility];  Service: Cardiovascular;  Laterality: N/A;  . TEMPORARY PACEMAKER N/A 06/22/2018   Procedure: TEMPORARY PACEMAKER;  Surgeon: Yolonda Kida, MD;  Location: [facility];  Service: Cardiovascular;  Laterality: N/A;  . TONSILLECTOMY    . TRANSURETHRAL RESECTION OF BLADDER TUMOR N/A 08/14/2016   Procedure: TRANSURETHRAL RESECTION OF BLADDER TUMOR (TURBT);  Surgeon: Hollice Espy, MD;  Location: [facility];  Service: Urology;  Laterality: N/A;  . TRANSURETHRAL RESECTION OF BLADDER TUMOR N/A 01/25/2018   Procedure: TRANSURETHRAL RESECTION OF BLADDER TUMOR (TURBT);  Surgeon: Billey Co, MD;  Location: [facility];  Service: Urology;  Laterality: N/A;  . TRANSURETHRAL RESECTION OF BLADDER TUMOR N/A 11/06/2018   Procedure: TRANSURETHRAL RESECTION OF  BLADDER TUMOR (TURBT);  Surgeon: Billey Co, MD;  Location: [facility];  Service: Urology;  Laterality: N/A;    FAMILY HISTORY: Family History  Problem Relation Age of Onset  . Heart failure Father   . Heart failure Brother   . Atrial fibrillation Sister   . Prostate cancer Neg Hx   . Kidney cancer Neg Hx   . Bladder Cancer Neg Hx     ADVANCED DIRECTIVES (Y/N):  N  HEALTH MAINTENANCE: Social History   Tobacco Use  . Smoking status: Never Smoker  . Smokeless tobacco: Never Used  Substance Use Topics  . Alcohol use: No  . Drug use: No     Colonoscopy:  PAP:  Bone density:  Lipid panel:  Allergies  Allergen Reactions  . Amiodarone Other (See Comments)    Dizziness  . Donepezil Palpitations    Severe bradycardia    Current Outpatient Medications  Medication Sig Dispense Refill  . apixaban (ELIQUIS) 5 MG TABS tablet Take 1 tablet (5 mg total) by mouth 2 (two) times daily. 60 tablet 0  . Calcium Carbonate-Vitamin D (OYSTER SHELL CALCIUM 500 + D) 500-125 MG-UNIT TABS Take 1 tablet by mouth daily.     . ferrous sulfate 325 (65 FE) MG tablet Take 325 mg by mouth 2 (two) times daily with a meal.    . FLUoxetine (PROZAC) 20 MG capsule Take 20 mg by mouth daily.     . furosemide (LASIX) 20 MG tablet Take 20 mg by mouth daily as needed (swelling feet/ankles.).     Marland Kitchen Melatonin 3 MG TABS Take 3 mg by mouth at bedtime.     . nitroGLYCERIN (NITROSTAT) 0.4 MG SL tablet Place 0.4 mg under the tongue every 5 (five) minutes as needed for chest pain (Up to 3 doses).     Marland Kitchen omeprazole (PRILOSEC) 20 MG capsule Take 20 mg by mouth daily as needed (Heartburn or acid reflux).     . pravastatin (PRAVACHOL) 40 MG tablet Take 40 mg by mouth at bedtime.   11  . prochlorperazine (COMPAZINE) 10 MG tablet Take 1 tablet (10 mg total) by mouth every 6 (six) hours as needed (Nausea or vomiting). 60 tablet 2  . spironolactone (ALDACTONE) 25 MG tablet Take 25 mg by mouth daily.     Marland Kitchen zinc oxide ([employer]  ZINC OXIDE) 20 % ointment Apply 1 application topically 2 (two) times daily as needed for irritation.      No current facility-administered medications for this visit.     OBJECTIVE: Vitals:   04/07/19 0948  BP: 105/73  Pulse: 63  Resp: 16  Temp: (!) 94.9 F (34.9 C)  SpO2: 96%     Body mass index is 27.88 kg/m.    ECOG FS:0 - Asymptomatic  General: Well-developed, well-nourished, no acute distress.  Sitting in a wheelchair. Eyes: Pink conjunctiva, anicteric sclera. HEENT: Normocephalic, moist mucous membranes. Lungs: Clear to auscultation bilaterally. Heart: Regular rate and rhythm. No rubs, murmurs, or gallops. Abdomen: Soft, nontender, nondistended. No organomegaly noted, normoactive bowel sounds. Musculoskeletal: No edema, cyanosis, or clubbing. Neuro: Alert, answering all questions appropriately. Cranial nerves grossly intact. Skin: No  rashes or petechiae noted. Psych: Normal affect.  LAB RESULTS:  Lab Results  Component Value Date   NA 135 04/07/2019   K 4.3 04/07/2019   CL 104 04/07/2019   CO2 25 04/07/2019   GLUCOSE 82 04/07/2019   BUN 26 (H) 04/07/2019   CREATININE 1.31 (H) 04/07/2019   CALCIUM 9.1 04/07/2019   PROT 7.9 04/07/2019   ALBUMIN 4.0 04/07/2019   AST 25 04/07/2019   ALT 21 04/07/2019   ALKPHOS 74 04/07/2019   BILITOT 1.5 (H) 04/07/2019   GFRNONAA 52 (L) 04/07/2019   GFRAA >60 04/07/2019    Lab Results  Component Value Date   WBC 6.4 04/07/2019   NEUTROABS 4.7 04/07/2019   HGB 15.4 04/07/2019   HCT 47.3 04/07/2019   MCV 97.1 04/07/2019   PLT 115 (L) 04/07/2019     STUDIES: Ct Abdomen Pelvis W Contrast  Result Date: 04/02/2019 CLINICAL DATA:  Restaging bladder cancer. EXAM: CT ABDOMEN AND PELVIS WITH CONTRAST TECHNIQUE: Multidetector CT imaging of the abdomen and pelvis was performed using the standard protocol following bolus administration of intravenous contrast. CONTRAST:  2mL OMNIPAQUE IOHEXOL 300 MG/ML  SOLN COMPARISON:   11/28/2018 FINDINGS: Lower chest: Chronic scarring and volume loss in the left lung base is again noted, unchanged. No acute abnormality. 2 mm nodule in the right lower lobe is unchanged, image 19/4. Hepatobiliary: No focal liver abnormality identified. Gallstones are identified measuring up to 7 mm. No gallbladder wall inflammation or biliary dilatation. Pancreas: Unremarkable. No pancreatic ductal dilatation or surrounding inflammatory changes. Spleen: Normal in size without focal abnormality. Adrenals/Urinary Tract: Normal appearance of the adrenal glands. Bilateral kidney cysts are again noted. The largest cyst within the right kidney is in the inferior pole measuring 4.2 cm. The largest left kidney cyst is also in the inferior pole measuring 4.4 cm. No mass or hydronephrosis identified bilaterally. Previously noted bilateral areas of bladder wall thickening and enhancement have improved in the interval. No new bladder abnormality. Stomach/Bowel: Stomach is within normal limits. Appendix appears normal. No evidence of bowel wall thickening, distention, or inflammatory changes. Colonic scratch set distal colonic diverticula noted without acute inflammation. Vascular/Lymphatic: Aortic atherosclerosis. No abdominopelvic adenopathy identified. Reproductive: Prostate is unremarkable. Other: No free fluid or fluid collections. Musculoskeletal: No acute or significant osseous findings. Spondylosis identified within the lumbar spine. This is most advanced at L2-3 and L3-4 IMPRESSION: 1. No acute findings and no specific findings identified to suggest recurrent tumor or metastatic disease. Interval improvement in previously noted asymmetric wall thickening and mucosal enhancement involving bilateral bladder wall. 2. Aortic atherosclerosis. 3. Gallstones. 4. Bilateral kidney cysts. Aortic Atherosclerosis (ICD10-I70.0). Electronically Signed   By: Kerby Moors M.D.   On: 04/02/2019 09:38    ASSESSMENT: Invasive  bladder cancer  PLAN:    1.  Invasive bladder cancer: Patient was diagnosed with a high-grade noninvasive bladder cancer on January 25, 2018.  He received 6 infusions of intravesicular gemcitabine at that time.  More recently cystoscopy revealed invasive disease.  Patient is not a candidate for cystectomy or systemic chemotherapy, but he may benefit from immunotherapy treatment using Tecentriq.  CT scan results from April 02, 2019 reviewed independently and reported as above with no obvious metastatic disease outside the patient's bladder.  He was noted to have interval improvement of asymmetric bladder wall thickening.  Will refer back to urology for consideration of cystoscopy.  Port placement was previously discussed, but is not necessary at this time.  If patient IV access becomes  difficult in the future, can reconsider this option.  Proceed with cycle 6 of Tecentriq today.  Return to clinic in 3 weeks for further evaluation and consideration of cycle 7. 2.  Thrombocytopenia: Chronic and unchanged.  Patient platelet count is 115 today. 3.  Elevated bilirubin: Chronic and unchanged.  Bilirubin is 1.5 today. 4.  Chronic renal insufficiency: Chronic and unchanged.  Creatinine is 1.31. 5.  Rash: Patient does not complain of this today.  Continue OTC treatments as needed.  Patient expressed understanding and was in agreement with this plan. He also understands that He can call clinic at any time with any questions, concerns, or complaints.     Lloyd Huger, MD   04/07/2019 2:39 PM

## 2019-04-07 ENCOUNTER — Encounter: Payer: Self-pay | Admitting: Oncology

## 2019-04-07 ENCOUNTER — Inpatient Hospital Stay: Payer: Medicare Other

## 2019-04-07 ENCOUNTER — Inpatient Hospital Stay (HOSPITAL_BASED_OUTPATIENT_CLINIC_OR_DEPARTMENT_OTHER): Payer: Medicare Other | Admitting: Oncology

## 2019-04-07 ENCOUNTER — Other Ambulatory Visit: Payer: Self-pay

## 2019-04-07 ENCOUNTER — Inpatient Hospital Stay: Payer: Medicare Other | Attending: Oncology

## 2019-04-07 ENCOUNTER — Telehealth: Payer: Self-pay | Admitting: Urology

## 2019-04-07 VITALS — BP 105/70 | HR 69 | Temp 96.4°F | Resp 20

## 2019-04-07 VITALS — BP 105/73 | HR 63 | Temp 94.9°F | Resp 16 | Wt 205.6 lb

## 2019-04-07 DIAGNOSIS — C679 Malignant neoplasm of bladder, unspecified: Secondary | ICD-10-CM | POA: Insufficient documentation

## 2019-04-07 DIAGNOSIS — Z79899 Other long term (current) drug therapy: Secondary | ICD-10-CM | POA: Insufficient documentation

## 2019-04-07 DIAGNOSIS — Z5112 Encounter for antineoplastic immunotherapy: Secondary | ICD-10-CM | POA: Insufficient documentation

## 2019-04-07 DIAGNOSIS — C689 Malignant neoplasm of urinary organ, unspecified: Secondary | ICD-10-CM

## 2019-04-07 LAB — CBC WITH DIFFERENTIAL/PLATELET
Abs Immature Granulocytes: 0.02 10*3/uL (ref 0.00–0.07)
Basophils Absolute: 0.1 10*3/uL (ref 0.0–0.1)
Basophils Relative: 1 %
Eosinophils Absolute: 0.3 10*3/uL (ref 0.0–0.5)
Eosinophils Relative: 5 %
HCT: 47.3 % (ref 39.0–52.0)
Hemoglobin: 15.4 g/dL (ref 13.0–17.0)
Immature Granulocytes: 0 %
Lymphocytes Relative: 13 %
Lymphs Abs: 0.8 10*3/uL (ref 0.7–4.0)
MCH: 31.6 pg (ref 26.0–34.0)
MCHC: 32.6 g/dL (ref 30.0–36.0)
MCV: 97.1 fL (ref 80.0–100.0)
Monocytes Absolute: 0.5 10*3/uL (ref 0.1–1.0)
Monocytes Relative: 8 %
Neutro Abs: 4.7 10*3/uL (ref 1.7–7.7)
Neutrophils Relative %: 73 %
Platelets: 115 10*3/uL — ABNORMAL LOW (ref 150–400)
RBC: 4.87 MIL/uL (ref 4.22–5.81)
RDW: 13.3 % (ref 11.5–15.5)
WBC: 6.4 10*3/uL (ref 4.0–10.5)
nRBC: 0 % (ref 0.0–0.2)

## 2019-04-07 LAB — COMPREHENSIVE METABOLIC PANEL
ALT: 21 U/L (ref 0–44)
AST: 25 U/L (ref 15–41)
Albumin: 4 g/dL (ref 3.5–5.0)
Alkaline Phosphatase: 74 U/L (ref 38–126)
Anion gap: 6 (ref 5–15)
BUN: 26 mg/dL — ABNORMAL HIGH (ref 8–23)
CO2: 25 mmol/L (ref 22–32)
Calcium: 9.1 mg/dL (ref 8.9–10.3)
Chloride: 104 mmol/L (ref 98–111)
Creatinine, Ser: 1.31 mg/dL — ABNORMAL HIGH (ref 0.61–1.24)
GFR calc Af Amer: >60 mL/min (ref >60)
GFR calc non Af Amer: 52 mL/min — ABNORMAL LOW (ref >60)
Glucose, Bld: 82 mg/dL (ref 70–99)
Potassium: 4.3 mmol/L (ref 3.5–5.1)
Sodium: 135 mmol/L (ref 135–145)
Total Bilirubin: 1.5 mg/dL — ABNORMAL HIGH (ref 0.3–1.2)
Total Protein: 7.9 g/dL (ref 6.5–8.1)

## 2019-04-07 MED ORDER — SODIUM CHLORIDE 0.9 % IV SOLN
Freq: Once | INTRAVENOUS | Status: AC
  Administered 2019-04-07: 11:00:00 via INTRAVENOUS
  Filled 2019-04-07: qty 250

## 2019-04-07 MED ORDER — SODIUM CHLORIDE 0.9 % IV SOLN
1200.0000 mg | Freq: Once | INTRAVENOUS | Status: AC
  Administered 2019-04-07: 12:00:00 1200 mg via INTRAVENOUS
  Filled 2019-04-07: qty 20

## 2019-04-07 NOTE — Progress Notes (Signed)
Patient unable to use the restroom to provide an urinalysis specimen at this time. Unable to collect urinalysis specimen. MD, Dr. Grayland Ormond, notified and aware. Per MD order: okay to proceed with Tecentriq treatment today without urinalysis.

## 2019-04-07 NOTE — Telephone Encounter (Signed)
Yes, schedule cysto in January, thanks  Nickolas Madrid, MD 04/07/2019

## 2019-04-07 NOTE — Telephone Encounter (Signed)
Pt's wife called and asked if pt needs a Cysto (per Dr Grayland Ormond) I told her there was not one scheduled yet. Please advise if we need to schedule.

## 2019-04-08 LAB — THYROID PANEL WITH TSH
Free Thyroxine Index: 2 (ref 1.2–4.9)
T3 Uptake Ratio: 28 % (ref 24–39)
T4, Total: 7.2 ug/dL (ref 4.5–12.0)
TSH: 2.65 u[IU]/mL (ref 0.450–4.500)

## 2019-04-08 NOTE — Telephone Encounter (Signed)
Appt made and confirmed by pt's wife.

## 2019-04-10 ENCOUNTER — Ambulatory Visit: Payer: Medicare Other | Admitting: Podiatry

## 2019-04-23 NOTE — Progress Notes (Signed)
[name]  Telephone:(336) [phone] Fax:(336) [phone]  ID: Kyle Gregory OB: 1940-05-05  MR#: [id number]  [id number]  Patient Care Team: Rusty Aus, MD as PCP - General (Internal Medicine)  CHIEF COMPLAINT: Invasive bladder cancer  INTERVAL HISTORY: Patient returns to clinic today for further evaluation and consideration of cycle 7 of Tecentriq. He is a poor historian given his underlying dementia.  He continues to tolerate his treatments well without significant side effects.  He has no neurologic complaints.  He denies any recent fevers or illnesses.  He has a good appetite and denies weight loss.  He denies any chest pain, shortness of breath, cough, or hemoptysis.  He denies any nausea, vomiting, constipation, or diarrhea.  He has no urinary complaints.  He denies hematuria.  Patient offers no specific complaints today.  REVIEW OF SYSTEMS:   Review of Systems  Constitutional: Negative.  Negative for fever, malaise/fatigue and weight loss.  Respiratory: Negative.  Negative for cough and shortness of breath.   Cardiovascular: Negative.  Negative for chest pain and leg swelling.  Gastrointestinal: Negative.  Negative for abdominal pain.  Genitourinary: Negative.  Negative for dysuria and hematuria.  Musculoskeletal: Negative.  Negative for back pain.  Skin: Negative.  Negative for rash.  Neurological: Negative.  Negative for focal weakness, weakness and headaches.  Psychiatric/Behavioral: Positive for memory loss. The patient is not nervous/anxious.     As per HPI. Otherwise, a complete review of systems is negative.  PAST MEDICAL HISTORY: Past Medical History:  Diagnosis Date  . Anemia   . Aortic stenosis   . Arrhythmia   . Atrial fibrillation ([place])   . Bladder cancer ([place] Gregory)   . Broken skin    buttocks  . Cardiomyopathy ([place] Gregory)   . CHF (congestive heart failure) ([place])   . COPD (chronic obstructive pulmonary disease) ([place])   . Coronary artery  disease   . Dementia ([place])   . Dysrhythmia   . Elevated lipids   . GERD (gastroesophageal reflux disease)   . Heartburn   . Lower extremity edema   . Presence of permanent cardiac pacemaker   . Stroke ([place])    x 2 after heart surgery  . VRE (vancomycin resistant enterococcus) culture positive     PAST SURGICAL HISTORY: Past Surgical History:  Procedure Laterality Date  . CARDIAC VALVE REPLACEMENT    . 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 CYSTOSCOPY W/ RETROGRADES Bilateral 08/14/2016   Procedure: CYSTOSCOPY WITH RETROGRADE PYELOGRAM;  Surgeon: Hollice Espy, MD;  Location: [facility];  Service: Urology;  Laterality: Bilateral;  . LEFT HEART CATH AND CORONARY ANGIOGRAPHY Left 09/05/2016   Procedure: Left Heart Cath and Coronary Angiography;  Surgeon: Teodoro Spray, MD;  Location: [facility];  Service: Cardiovascular;  Laterality: Left;  . neck growth removal     1970's  . PACEMAKER INSERTION N/A 06/24/2018   Procedure: INSERTION PACEMAKER;  Surgeon: Isaias Cowman, MD;  Location: [facility];  Service: Cardiovascular;  Laterality: N/A;  . TEMPORARY PACEMAKER N/A 06/22/2018   Procedure: TEMPORARY PACEMAKER;  Surgeon: Yolonda Kida, MD;  Location: [facility];  Service: Cardiovascular;  Laterality: N/A;  . TONSILLECTOMY    . TRANSURETHRAL RESECTION OF BLADDER TUMOR N/A 08/14/2016   Procedure: TRANSURETHRAL RESECTION OF BLADDER TUMOR (TURBT);  Surgeon: Hollice Espy, MD;  Location: [facility];  Service: Urology;  Laterality: N/A;  . TRANSURETHRAL RESECTION OF BLADDER TUMOR N/A 01/25/2018  Procedure: TRANSURETHRAL RESECTION OF BLADDER TUMOR (TURBT);  Surgeon: Billey Co, MD;  Location: [facility];  Service: Urology;  Laterality: N/A;  . TRANSURETHRAL RESECTION OF BLADDER TUMOR N/A 11/06/2018   Procedure: TRANSURETHRAL RESECTION OF BLADDER TUMOR (TURBT);  Surgeon: Billey Co, MD;  Location: [facility];  Service:  Urology;  Laterality: N/A;    FAMILY HISTORY: Family History  Problem Relation Age of Onset  . Heart failure Father   . Heart failure Brother   . Atrial fibrillation Sister   . Prostate cancer Neg Hx   . Kidney cancer Neg Hx   . Bladder Cancer Neg Hx     ADVANCED DIRECTIVES (Y/N):  N  HEALTH MAINTENANCE: Social History   Tobacco Use  . Smoking status: Never Smoker  . Smokeless tobacco: Never Used  Substance Use Topics  . Alcohol use: No  . Drug use: No     Colonoscopy:  PAP:  Bone density:  Lipid panel:  Allergies  Allergen Reactions  . Amiodarone Other (See Comments)    Dizziness  . Donepezil Palpitations    Severe bradycardia    Current Outpatient Medications  Medication Sig Dispense Refill  . apixaban (ELIQUIS) 5 MG TABS tablet Take 1 tablet (5 mg total) by mouth 2 (two) times daily. 60 tablet 0  . Calcium Carb-Cholecalciferol (OYSTER SHELL CALCIUM) 500-400 MG-UNIT TABS Take by mouth.    . Calcium Carbonate-Vitamin D (OYSTER SHELL CALCIUM 500 + D) 500-125 MG-UNIT TABS Take 1 tablet by mouth daily.     . ferrous sulfate 325 (65 FE) MG tablet Take 325 mg by mouth 2 (two) times daily with a meal.    . FLUoxetine (PROZAC) 20 MG capsule Take 20 mg by mouth daily.     . furosemide (LASIX) 20 MG tablet Take 20 mg by mouth daily as needed (swelling feet/ankles.).     Marland Kitchen Melatonin 3 MG TABS Take 3 mg by mouth at bedtime.     . nitroGLYCERIN (NITROSTAT) 0.4 MG SL tablet Place 0.4 mg under the tongue every 5 (five) minutes as needed for chest pain (Up to 3 doses).     Marland Kitchen omeprazole (PRILOSEC) 20 MG capsule Take 20 mg by mouth daily as needed (Heartburn or acid reflux).     . pravastatin (PRAVACHOL) 40 MG tablet Take 40 mg by mouth at bedtime.   11  . prochlorperazine (COMPAZINE) 10 MG tablet Take 1 tablet (10 mg total) by mouth every 6 (six) hours as needed (Nausea or vomiting). 60 tablet 2  . spironolactone (ALDACTONE) 25 MG tablet Take 25 mg by mouth daily.     Marland Kitchen zinc  oxide ([employer] ZINC OXIDE) 20 % ointment Apply 1 application topically 2 (two) times daily as needed for irritation.      No current facility-administered medications for this visit.    OBJECTIVE: Vitals:   04/28/19 1312  BP: 136/90  Pulse: 75  Temp: (!) 95.6 F (35.3 C)     Body mass index is 27.94 kg/m.    ECOG FS:0 - Asymptomatic  General: Well-developed, well-nourished, no acute distress.  Sitting in a wheelchair. Eyes: Pink conjunctiva, anicteric sclera. HEENT: Normocephalic, moist mucous membranes. Lungs: No audible wheezing or coughing. Heart: Regular rate and rhythm. Abdomen: Soft, nontender, no obvious distention. Musculoskeletal: No edema, cyanosis, or clubbing. Neuro: Alert, answering all questions appropriately. Cranial nerves grossly intact. Skin: No rashes or petechiae noted. Psych: Normal affect.  LAB RESULTS:  Lab Results  Component Value Date   NA  135 04/28/2019   K 4.4 04/28/2019   CL 102 04/28/2019   CO2 25 04/28/2019   GLUCOSE 103 (H) 04/28/2019   BUN 27 (H) 04/28/2019   CREATININE 1.21 04/28/2019   CALCIUM 9.0 04/28/2019   PROT 8.0 04/28/2019   ALBUMIN 3.9 04/28/2019   AST 23 04/28/2019   ALT 17 04/28/2019   ALKPHOS 75 04/28/2019   BILITOT 1.5 (H) 04/28/2019   GFRNONAA 57 (L) 04/28/2019   GFRAA >60 04/28/2019    Lab Results  Component Value Date   WBC 6.6 04/28/2019   NEUTROABS 5.0 04/28/2019   HGB 16.1 04/28/2019   HCT 49.7 04/28/2019   MCV 97.1 04/28/2019   PLT 118 (L) 04/28/2019     STUDIES: CT Abdomen Pelvis W Contrast  Result Date: 04/02/2019 CLINICAL DATA:  Restaging bladder cancer. EXAM: CT ABDOMEN AND PELVIS WITH CONTRAST TECHNIQUE: Multidetector CT imaging of the abdomen and pelvis was performed using the standard protocol following bolus administration of intravenous contrast. CONTRAST:  61mL OMNIPAQUE IOHEXOL 300 MG/ML  SOLN COMPARISON:  11/28/2018 FINDINGS: Lower chest: Chronic scarring and volume loss in the left lung base is  again noted, unchanged. No acute abnormality. 2 mm nodule in the right lower lobe is unchanged, image 19/4. Hepatobiliary: No focal liver abnormality identified. Gallstones are identified measuring up to 7 mm. No gallbladder wall inflammation or biliary dilatation. Pancreas: Unremarkable. No pancreatic ductal dilatation or surrounding inflammatory changes. Spleen: Normal in size without focal abnormality. Adrenals/Urinary Tract: Normal appearance of the adrenal glands. Bilateral kidney cysts are again noted. The largest cyst within the right kidney is in the inferior pole measuring 4.2 cm. The largest left kidney cyst is also in the inferior pole measuring 4.4 cm. No mass or hydronephrosis identified bilaterally. Previously noted bilateral areas of bladder wall thickening and enhancement have improved in the interval. No new bladder abnormality. Stomach/Bowel: Stomach is within normal limits. Appendix appears normal. No evidence of bowel wall thickening, distention, or inflammatory changes. Colonic scratch set distal colonic diverticula noted without acute inflammation. Vascular/Lymphatic: Aortic atherosclerosis. No abdominopelvic adenopathy identified. Reproductive: Prostate is unremarkable. Other: No free fluid or fluid collections. Musculoskeletal: No acute or significant osseous findings. Spondylosis identified within the lumbar spine. This is most advanced at L2-3 and L3-4 IMPRESSION: 1. No acute findings and no specific findings identified to suggest recurrent tumor or metastatic disease. Interval improvement in previously noted asymmetric wall thickening and mucosal enhancement involving bilateral bladder wall. 2. Aortic atherosclerosis. 3. Gallstones. 4. Bilateral kidney cysts. Aortic Atherosclerosis (ICD10-I70.0). Electronically Signed   By: Kerby Moors M.D.   On: 04/02/2019 09:38    ASSESSMENT: Invasive bladder cancer  PLAN:    1.  Invasive bladder cancer: Patient was diagnosed with a high-grade  noninvasive bladder cancer on January 25, 2018.  He received 6 infusions of intravesicular gemcitabine at that time.  More recently cystoscopy revealed invasive disease.  Patient is not a candidate for cystectomy or systemic chemotherapy, but he may benefit from immunotherapy treatment using Tecentriq.  CT scan results from April 02, 2019 reviewed independently with no obvious metastatic disease outside the patient's bladder.  He was noted to have interval improvement of asymmetric bladder wall thickening.  Patient has cystoscopy scheduled for May 15, 2019. Port placement was previously discussed, but is not necessary at this time.  If patient IV access becomes difficult in the future, can reconsider this option.  Proceed with cycle 7 of Tecentriq today.  Return to clinic in 3 weeks for further evaluation  and consideration of cycle 8. 2.  Thrombocytopenia: Chronic and unchanged.  Platelet count is 118 today. 3.  Elevated bilirubin: Chronic and unchanged.  Bilirubin is 1.5 today. 4.  Chronic renal insufficiency: Slightly improved.  Creatinine is 1.21 today. 5.  Rash: Patient does not complain of this today.  Continue OTC treatments as needed.  Patient expressed understanding and was in agreement with this plan. He also understands that He can call clinic at any time with any questions, concerns, or complaints.     Lloyd Huger, MD   04/28/2019 4:17 PM

## 2019-04-24 IMAGING — CT CT ABD-PEL WO/W CM
3 of 12 series · 10 of 46 positions shown, 16 images · IV contrast (iopamidol)
Comparison: 06/09/2016 CT chest, abdomen and pelvis.

CLINICAL DATA: Gross hematuria. History of bladder carcinoma in
situ status post TURBT 08/14/2016 with adjuvant BCG therapy.

EXAM:
CT ABDOMEN AND PELVIS WITHOUT AND WITH CONTRAST
TECHNIQUE: Multidetector CT imaging of the abdomen and pelvis was performed
following the standard protocol before and following the bolus
administration of intravenous contrast.
CONTRAST:  125mL [id number] IOPAMIDOL ([id number]) INJECTION 61%

[Series 2: without pre · axial · non-contrast · 0.83mm/px · z∈[-1585,-1210]mm · 6 of 106 slices shown, 11 images]
[im 16/106  soft-tissue]
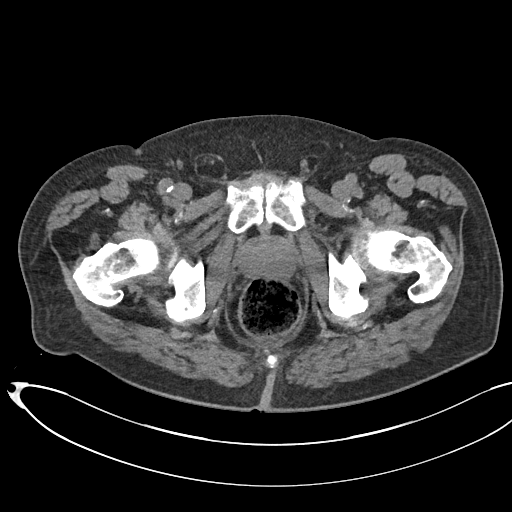
[im 16/106  bone]
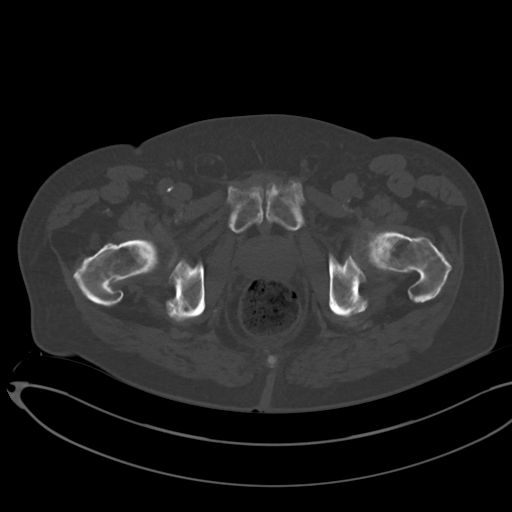
[im 31/106  soft-tissue]
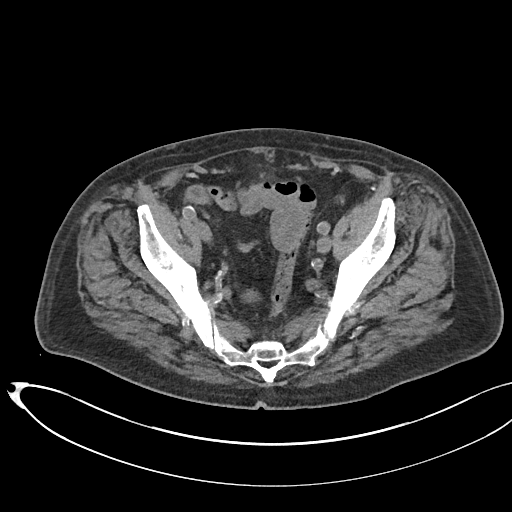
[im 46/106  soft-tissue]
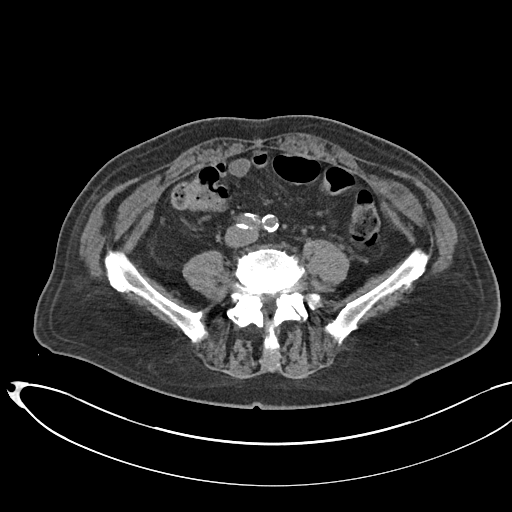
[im 46/106  lung]
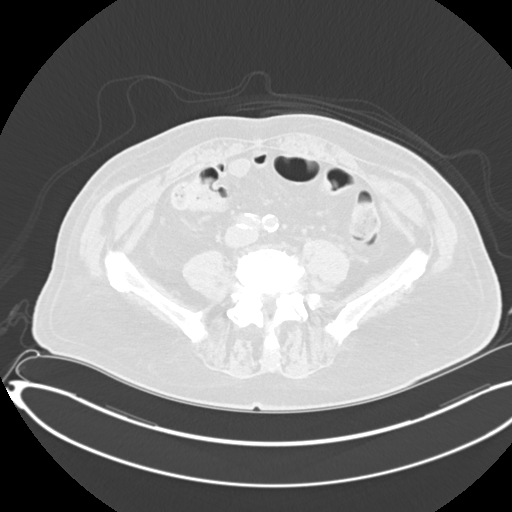
[im 61/106  soft-tissue]
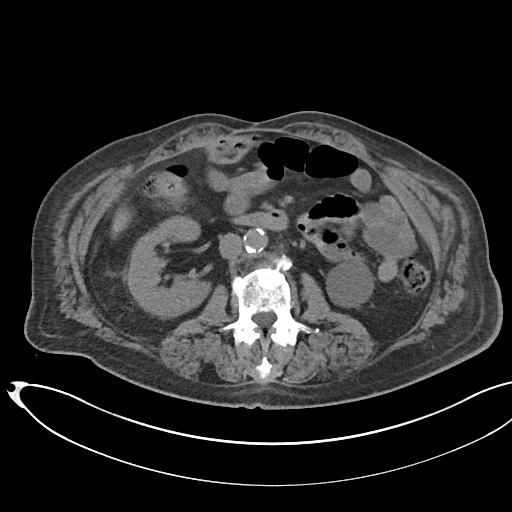
[im 61/106  lung]
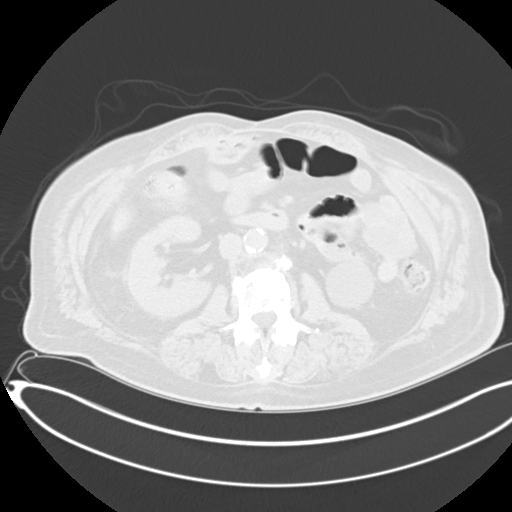
[im 76/106  soft-tissue]
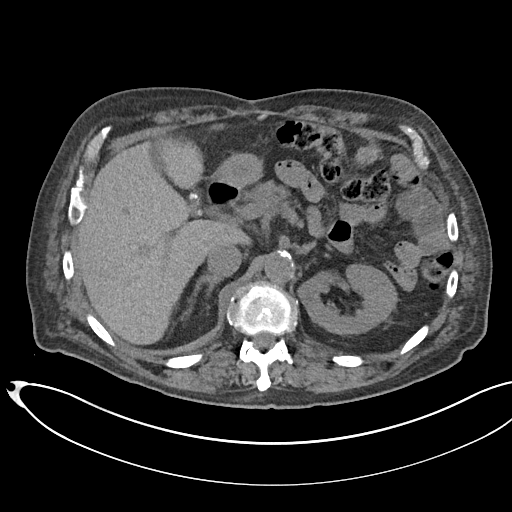
[im 76/106  lung]
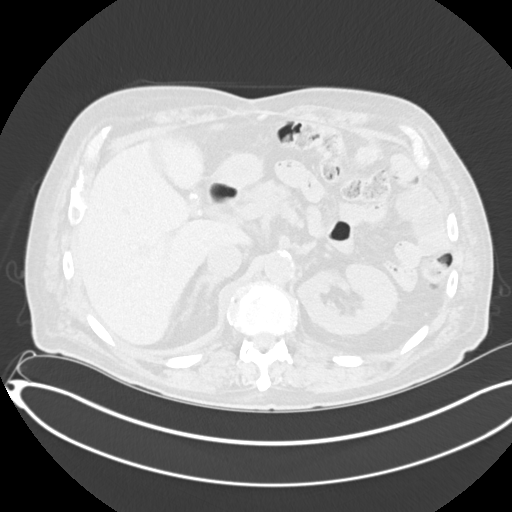
[im 91/106  soft-tissue]
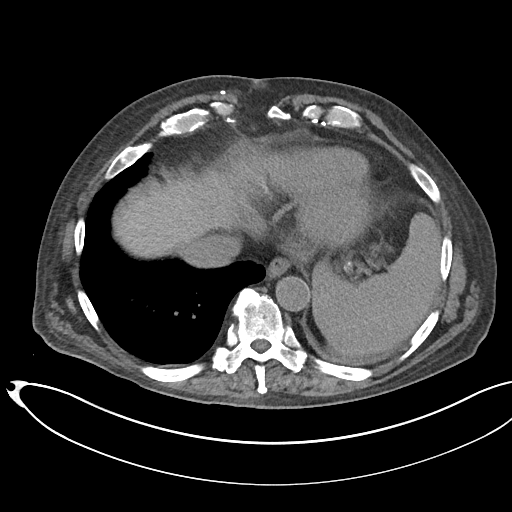
[im 91/106  lung]
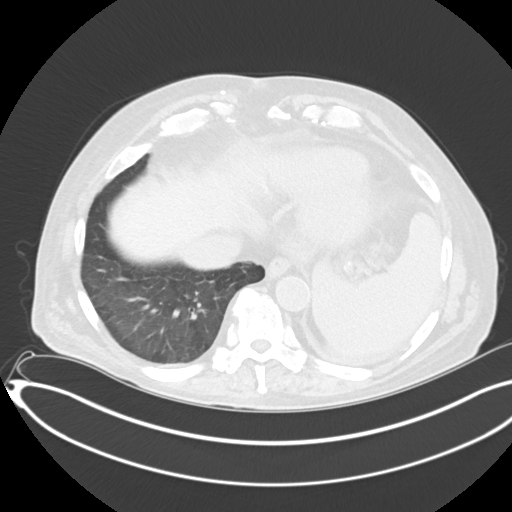

[Series 9: axial with hematuria with · axial · 0.83mm/px · z∈[-1585,-1510]mm · 2 of 106 slices shown]
[im 16/106  soft-tissue]
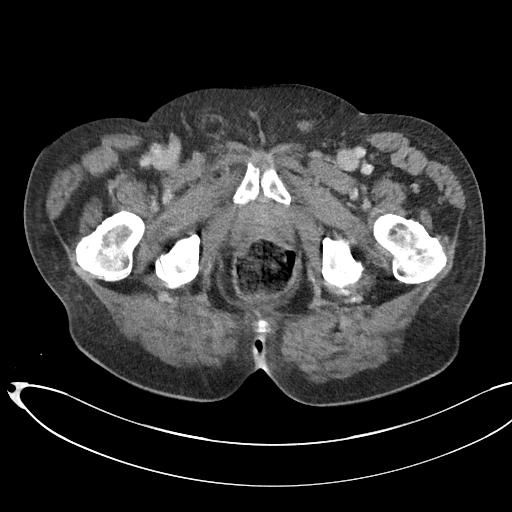
[im 31/106  soft-tissue]
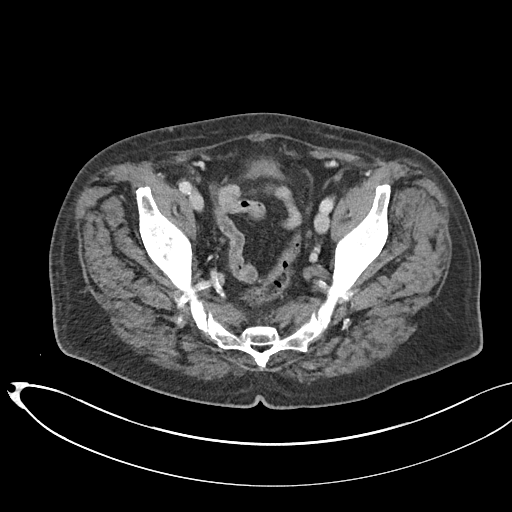

[Series 20: cor delay delay prone · coronal · delayed · 0.78mm/px · 2 of 199 slices shown, 3 images]
[im 67/199  soft-tissue]
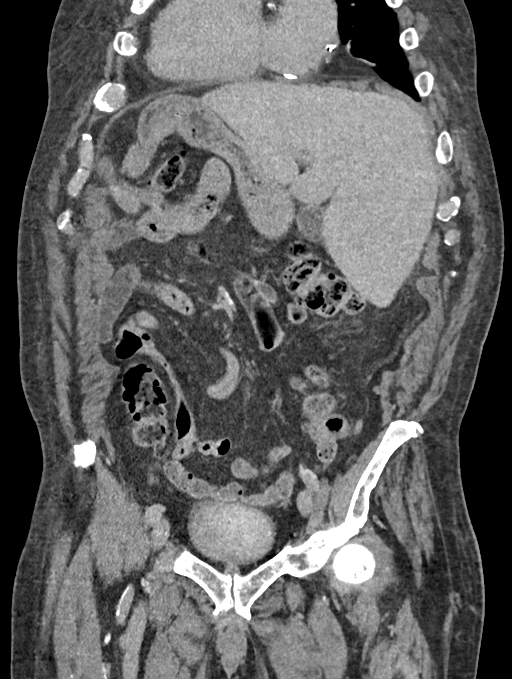
[im 67/199  bone]
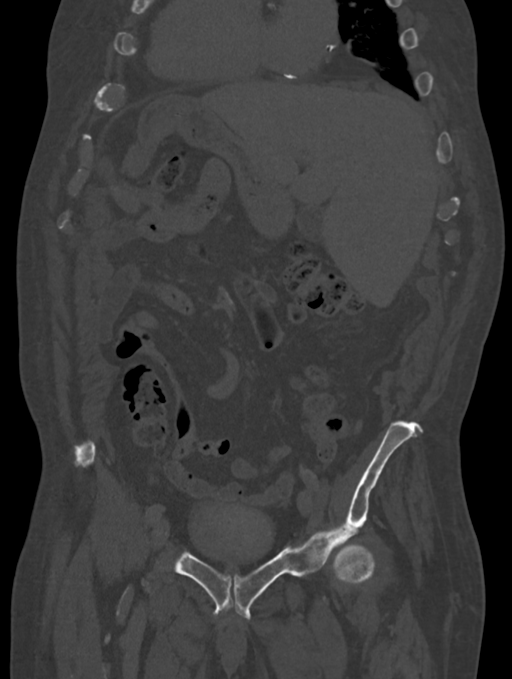
[im 133/199  soft-tissue]
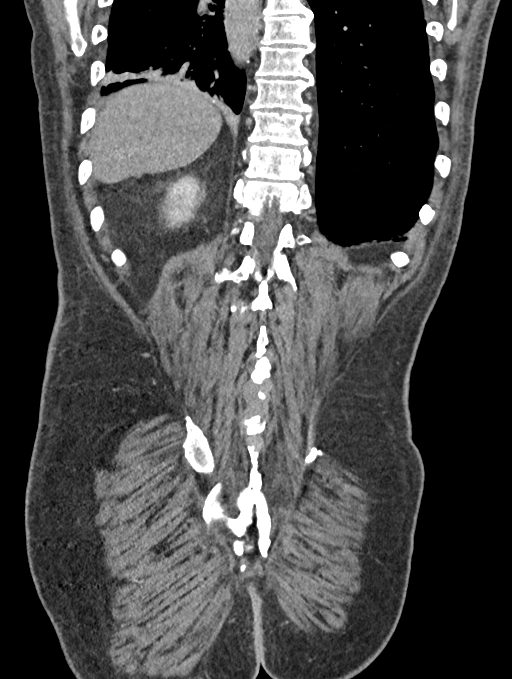

[10 of 46 positions shown; findings below may reference images not displayed]

FINDINGS: Motion degraded scan, limiting assessment.

Lower chest: Mild scarring versus atelectasis in the anterior
basilar left lower lobe. Mosaic attenuation at both lung bases.
Cardiomegaly. Aortic valvular prosthesis is in place. Coronary
atherosclerosis. Stable tiny granulomas at both lung bases.

Hepatobiliary: Normal liver size. No liver mass. Cholelithiasis. No
biliary ductal dilatation.

Pancreas: Normal, with no mass or duct dilation.

Spleen: Punctate calcified granulomas scattered in the spleen.
Normal size spleen. No splenic mass.

Adrenals/Urinary Tract: Normal adrenals. No renal stones. No
hydronephrosis. Several simple renal cysts in both kidneys, largest
4.0 cm in the upper right kidney and 4.3 cm in the lower left
kidney. Additional subcentimeter hypodense renal cortical lesions in
both kidneys are too small to characterize and not appreciably
changed, considered benign. On delayed imaging, there is no
urothelial wall thickening and there are no filling defects in the
opacified portions of the bilateral collecting systems or ureters,
noting limited evaluation of much of the ureters bilaterally due to
poor opacification. No bladder stones. Diffuse bladder wall
thickening is slightly asymmetrically prominent in the lateral right
bladder wall (series 9/image 81) with associated slight asymmetric
wall hyperenhancement. No bladder diverticula.

Stomach/Bowel: Normal non-distended stomach. Normal caliber small
bowel with no small bowel wall thickening. Normal appendix. Normal
large bowel with no diverticulosis, large bowel wall thickening or
pericolonic fat stranding.

Vascular/Lymphatic: Atherosclerotic nonaneurysmal abdominal aorta.
Patent portal, splenic, hepatic and renal veins. No pathologically
enlarged lymph nodes in the abdomen or pelvis.

Reproductive: Mildly enlarged prostate with nonspecific internal
prostatic calcification, unchanged.

Other: No pneumoperitoneum, ascites or focal fluid collection.
Stable small fat containing right inguinal hernia.

Musculoskeletal: No aggressive appearing focal osseous lesions.
Incomplete healing of visualized lower sternotomy. Marked lumbar
spondylosis.
IMPRESSION: 1. Limited motion degraded scan.
2. Nonspecific diffuse bladder wall thickening is slightly
asymmetrically prominent in the lateral right bladder wall with
associated slight asymmetric bladder wall hyperenhancement, cannot
exclude recurrent bladder malignancy in this location.
3. No evidence of metastatic disease in the abdomen or pelvis.
4. No evidence of upper tract urothelial lesions, with limitations
as described. No suspicious renal cortical masses.
5. Mildly enlarged prostate.
6.  Aortic Atherosclerosis (BPA5O-GC4.4).
7. Additional chronic findings as detailed.

## 2019-04-28 ENCOUNTER — Inpatient Hospital Stay: Payer: Medicare Other

## 2019-04-28 ENCOUNTER — Encounter: Payer: Self-pay | Admitting: Oncology

## 2019-04-28 ENCOUNTER — Other Ambulatory Visit: Payer: Self-pay

## 2019-04-28 ENCOUNTER — Inpatient Hospital Stay (HOSPITAL_BASED_OUTPATIENT_CLINIC_OR_DEPARTMENT_OTHER): Payer: Medicare Other | Admitting: Oncology

## 2019-04-28 VITALS — BP 136/90 | HR 75 | Temp 95.6°F | Wt 206.0 lb

## 2019-04-28 VITALS — Resp 20

## 2019-04-28 DIAGNOSIS — C679 Malignant neoplasm of bladder, unspecified: Secondary | ICD-10-CM

## 2019-04-28 DIAGNOSIS — Z5112 Encounter for antineoplastic immunotherapy: Secondary | ICD-10-CM | POA: Diagnosis not present

## 2019-04-28 DIAGNOSIS — C689 Malignant neoplasm of urinary organ, unspecified: Secondary | ICD-10-CM

## 2019-04-28 LAB — COMPREHENSIVE METABOLIC PANEL
ALT: 17 U/L (ref 0–44)
AST: 23 U/L (ref 15–41)
Albumin: 3.9 g/dL (ref 3.5–5.0)
Alkaline Phosphatase: 75 U/L (ref 38–126)
Anion gap: 8 (ref 5–15)
BUN: 27 mg/dL — ABNORMAL HIGH (ref 8–23)
CO2: 25 mmol/L (ref 22–32)
Calcium: 9 mg/dL (ref 8.9–10.3)
Chloride: 102 mmol/L (ref 98–111)
Creatinine, Ser: 1.21 mg/dL (ref 0.61–1.24)
GFR calc Af Amer: >60 mL/min (ref >60)
GFR calc non Af Amer: 57 mL/min — ABNORMAL LOW (ref >60)
Glucose, Bld: 103 mg/dL — ABNORMAL HIGH (ref 70–99)
Potassium: 4.4 mmol/L (ref 3.5–5.1)
Sodium: 135 mmol/L (ref 135–145)
Total Bilirubin: 1.5 mg/dL — ABNORMAL HIGH (ref 0.3–1.2)
Total Protein: 8 g/dL (ref 6.5–8.1)

## 2019-04-28 LAB — CBC WITH DIFFERENTIAL/PLATELET
Abs Immature Granulocytes: 0.02 10*3/uL (ref 0.00–0.07)
Basophils Absolute: 0 10*3/uL (ref 0.0–0.1)
Basophils Relative: 1 %
Eosinophils Absolute: 0.3 10*3/uL (ref 0.0–0.5)
Eosinophils Relative: 4 %
HCT: 49.7 % (ref 39.0–52.0)
Hemoglobin: 16.1 g/dL (ref 13.0–17.0)
Immature Granulocytes: 0 %
Lymphocytes Relative: 11 %
Lymphs Abs: 0.7 10*3/uL (ref 0.7–4.0)
MCH: 31.4 pg (ref 26.0–34.0)
MCHC: 32.4 g/dL (ref 30.0–36.0)
MCV: 97.1 fL (ref 80.0–100.0)
Monocytes Absolute: 0.6 10*3/uL (ref 0.1–1.0)
Monocytes Relative: 8 %
Neutro Abs: 5 10*3/uL (ref 1.7–7.7)
Neutrophils Relative %: 76 %
Platelets: 118 10*3/uL — ABNORMAL LOW (ref 150–400)
RBC: 5.12 MIL/uL (ref 4.22–5.81)
RDW: 13.5 % (ref 11.5–15.5)
WBC: 6.6 10*3/uL (ref 4.0–10.5)
nRBC: 0 % (ref 0.0–0.2)

## 2019-04-28 MED ORDER — SODIUM CHLORIDE 0.9 % IV SOLN
Freq: Once | INTRAVENOUS | Status: AC
  Filled 2019-04-28: qty 250

## 2019-04-28 MED ORDER — SODIUM CHLORIDE 0.9 % IV SOLN
1200.0000 mg | Freq: Once | INTRAVENOUS | Status: AC
  Administered 2019-04-28: 1200 mg via INTRAVENOUS
  Filled 2019-04-28: qty 20

## 2019-04-28 NOTE — Progress Notes (Signed)
Patient here for follow up. No specific complaints or concerns today.

## 2019-04-29 LAB — THYROID PANEL WITH TSH
Free Thyroxine Index: 2.2 (ref 1.2–4.9)
T3 Uptake Ratio: 29 % (ref 24–39)
T4, Total: 7.6 ug/dL (ref 4.5–12.0)
TSH: 1.54 u[IU]/mL (ref 0.450–4.500)

## 2019-05-08 ENCOUNTER — Ambulatory Visit: Payer: Medicare Other | Admitting: Podiatry

## 2019-05-13 ENCOUNTER — Telehealth: Payer: Self-pay | Admitting: *Deleted

## 2019-05-13 NOTE — Telephone Encounter (Signed)
Wife called asking if patient can get COVID vaccine or not. Please advise

## 2019-05-13 NOTE — Telephone Encounter (Signed)
Call returned to wife and informed per Dr Grayland Ormond that patient can get the COVID vaccine. She will call and schedule appointment with health department

## 2019-05-13 NOTE — Telephone Encounter (Signed)
Yes

## 2019-05-15 ENCOUNTER — Other Ambulatory Visit: Payer: Self-pay

## 2019-05-15 ENCOUNTER — Encounter: Payer: Self-pay | Admitting: Urology

## 2019-05-15 ENCOUNTER — Ambulatory Visit (INDEPENDENT_AMBULATORY_CARE_PROVIDER_SITE_OTHER): Payer: Medicare Other | Admitting: Urology

## 2019-05-15 VITALS — BP 104/69 | HR 75 | Ht 71.0 in | Wt 199.0 lb

## 2019-05-15 DIAGNOSIS — C679 Malignant neoplasm of bladder, unspecified: Secondary | ICD-10-CM

## 2019-05-15 MED ORDER — LIDOCAINE HCL URETHRAL/MUCOSAL 2 % EX GEL
1.0000 "application " | Freq: Once | CUTANEOUS | Status: AC
  Administered 2019-05-15: 1 via URETHRAL

## 2019-05-15 NOTE — Progress Notes (Signed)
Cystoscopy Procedure Note:  Indication:Hx of HG bladder cancer  Prior treatments: -07/2016: TURBT 3cm HG Ta and CIS -BCG induction 3/6 treatments Lost to follow up -12/2017 TURBT 2cm HG Ta (only 10% of tumor HG, primarily LG), no invasion -Induction gemcitabine 03/2018 (BCG shortage) -10/2018: TURBT small ~1cm lesions x2, HG Ta -IV Atezolizumab with Medical Oncology x7 cycles for BCG refractor HG NMIBC  After informed consent and discussion of the procedure and its risks,Nollie E Sniderwas positioned and prepped in the standard fashion. Cystoscopy was performed with a flexible cystoscope. The urethra, bladder neck and entire bladder was visualized in a standard fashion. The prostate was moderate in size.   There was a very small spot less than 61mm in size at the right lateral wall concerning for possible early papillary recurrence.  The bladder was otherwise normal.  Findings: Very small 60mm area of erythema worrisome for possible recurrence  Recent CT imaging 04/02/2019 with contrast showed no evidence of metastatic disease or bladder wall thickening.  Assessment and Plan: I had a long conversation with the patient and his wife about the findings today and treatment options.  He has not done well with TURBTs in the past with his dementia, need for anticoagulation, and problems with recurrent gross hematuria postop with resumption of Eliquis.  In the setting of the Covid pandemic, they would also like to hold off on any invasive treatments or surgery at this time which is very reasonable.  I recommended following up in clinic in 6 months with a cystoscopy and possible fulguration, and stopping his Eliquis 3 days prior to this visit.  We again reviewed this is not standard practice for patients with a history of high-grade urothelial cell carcinoma, however with his numerous comorbidities, may be a good option to manage any recurrence.  They are strongly in agreement with this plan.  -RTC  6 months for clinic cystoscopy and possible fulguration, will stop anticoagulation 3 days prior to that visit  Nickolas Madrid, MD 05/15/2019

## 2019-05-16 ENCOUNTER — Encounter: Payer: Self-pay | Admitting: Oncology

## 2019-05-16 ENCOUNTER — Telehealth: Payer: Self-pay | Admitting: Emergency Medicine

## 2019-05-16 NOTE — Telephone Encounter (Signed)
Called to do patient's pre-charting prior to [facility] appointment and spoke with pt's wife. Pt's wife stated that he had a cysto done with Dr. Diamantina Providence yesterday and she was under the impression that pt was not to be getting anymore treatment based on those results. I explained that we may not give him treatment tomorrow, but that we had a reserve the chair space. Pt's wife asked that I speak with Dr. Grayland Ormond to see if he needed to come in and if he needed treatment, and asked that we speak with Dr. Diamantina Providence to see if he wants pt to get more treatment. Please advise.

## 2019-05-16 NOTE — Progress Notes (Signed)
[place]  Telephone:([phone]) [phone] Fax:([phone]) [phone]  ID: Kyle Gregory OB: 09-14-1940  MR#: [id number]  [id number]  Patient Care Team: Rusty Aus, MD as PCP - General (Internal Medicine)  CHIEF COMPLAINT: Invasive bladder cancer  INTERVAL HISTORY: Patient returns to clinic today for further evaluation and consideration of cycle 8 of Tecentriq.  He is a poor historian given his underlying dementia and much of the history is given by his wife over the phone.  He recently had a cystoscopy that only revealed one area that was suspicious for persistent malignancy.  He currently feels well and is baseline. He continues to tolerate his treatments well without significant side effects.  He has no neurologic complaints.  He denies any recent fevers or illnesses.  He has a good appetite and denies weight loss.  He denies any chest pain, shortness of breath, cough, or hemoptysis.  He denies any nausea, vomiting, constipation, or diarrhea.  He has no urinary complaints.  He denies hematuria.  Patient offers no specific complaints today.    REVIEW OF SYSTEMS:   Review of Systems  Constitutional: Negative.  Negative for fever, malaise/fatigue and weight loss.  Respiratory: Negative.  Negative for cough and shortness of breath.   Cardiovascular: Negative.  Negative for chest pain and leg swelling.  Gastrointestinal: Negative.  Negative for abdominal pain.  Genitourinary: Negative.  Negative for dysuria and hematuria.  Musculoskeletal: Negative.  Negative for back pain.  Skin: Negative.  Negative for rash.  Neurological: Negative.  Negative for focal weakness, weakness and headaches.  Psychiatric/Behavioral: Positive for memory loss. The patient is not nervous/anxious.     As per HPI. Otherwise, a complete review of systems is negative.  PAST MEDICAL HISTORY: Past Medical History:  Diagnosis Date  . Anemia   . Aortic stenosis   . Arrhythmia   . Atrial fibrillation  ([place])   . Bladder cancer ([place])   . Broken skin    buttocks  . Cardiomyopathy ([place])   . CHF (congestive heart failure) ([place])   . COPD (chronic obstructive pulmonary disease) ([place])   . Coronary artery disease   . Dementia ([place])   . Dysrhythmia   . Elevated lipids   . GERD (gastroesophageal reflux disease)   . Heartburn   . Lower extremity edema   . Presence of permanent cardiac pacemaker   . 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 VRE (vancomycin resistant enterococcus) culture positive     PAST SURGICAL HISTORY: Past Surgical History:  Procedure Laterality Date  . CARDIAC VALVE REPLACEMENT    . 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 CYSTOSCOPY W/ RETROGRADES Bilateral 08/14/2016   Procedure: CYSTOSCOPY WITH RETROGRADE PYELOGRAM;  Surgeon: Hollice Espy, MD;  Location: [facility];  Service: Urology;  Laterality: Bilateral;  . LEFT HEART CATH AND CORONARY ANGIOGRAPHY Left 09/05/2016   Procedure: Left Heart Cath and Coronary Angiography;  Surgeon: Teodoro Spray, MD;  Location: [facility];  Service: Cardiovascular;  Laterality: Left;  . neck growth removal     1970's  . PACEMAKER INSERTION N/A 06/24/2018   Procedure: INSERTION PACEMAKER;  Surgeon: Isaias Cowman, MD;  Location: [facility];  Service: Cardiovascular;  Laterality: N/A;  . TEMPORARY PACEMAKER N/A 06/22/2018   Procedure: TEMPORARY PACEMAKER;  Surgeon: Yolonda Kida, MD;  Location: [facility] [facility];  Service: Cardiovascular;  Laterality: N/A;  . TONSILLECTOMY    .  TRANSURETHRAL RESECTION OF BLADDER TUMOR N/A 08/14/2016   Procedure: TRANSURETHRAL RESECTION OF BLADDER TUMOR (TURBT);  Surgeon: Hollice Espy, MD;  Location: [facility];  Service: Urology;  Laterality: N/A;  . TRANSURETHRAL RESECTION OF BLADDER TUMOR N/A 01/25/2018   Procedure: TRANSURETHRAL RESECTION OF BLADDER TUMOR (TURBT);  Surgeon: Billey Co, MD;  Location: [facility];  Service: Urology;   Laterality: N/A;  . TRANSURETHRAL RESECTION OF BLADDER TUMOR N/A 11/06/2018   Procedure: TRANSURETHRAL RESECTION OF BLADDER TUMOR (TURBT);  Surgeon: Billey Co, MD;  Location: [facility];  Service: Urology;  Laterality: N/A;    FAMILY HISTORY: Family History  Problem Relation Age of Onset  . Heart failure Father   . Heart failure Brother   . Atrial fibrillation Sister   . Prostate cancer Neg Hx   . Kidney cancer Neg Hx   . Bladder Cancer Neg Hx     ADVANCED DIRECTIVES (Y/N):  N  HEALTH MAINTENANCE: Social History   Tobacco Use  . Smoking status: Never Smoker  . Smokeless tobacco: Never Used  Substance Use Topics  . Alcohol use: No  . Drug use: No     Colonoscopy:  PAP:  Bone density:  Lipid panel:  Allergies  Allergen Reactions  . Amiodarone Other (See Comments)    Dizziness  . Donepezil Palpitations    Severe bradycardia    Current Outpatient Medications  Medication Sig Dispense Refill  . apixaban (ELIQUIS) 5 MG TABS tablet Take 1 tablet (5 mg total) by mouth 2 (two) times daily. 60 tablet 0  . Calcium Carb-Cholecalciferol (OYSTER SHELL CALCIUM) 500-400 MG-UNIT TABS Take by mouth.    . Calcium Carbonate-Vitamin D (OYSTER SHELL CALCIUM 500 + D) 500-125 MG-UNIT TABS Take 1 tablet by mouth daily.     . ferrous sulfate 325 (65 FE) MG tablet Take 325 mg by mouth 2 (two) times daily with a meal.    . FLUoxetine (PROZAC) 20 MG capsule Take 20 mg by mouth daily.     . furosemide (LASIX) 20 MG tablet Take 20 mg by mouth daily as needed (swelling feet/ankles.).     Marland Kitchen Melatonin 3 MG TABS Take 3 mg by mouth at bedtime.     . nitroGLYCERIN (NITROSTAT) 0.4 MG SL tablet Place 0.4 mg under the tongue every 5 (five) minutes as needed for chest pain (Up to 3 doses).     Marland Kitchen omeprazole (PRILOSEC) 20 MG capsule Take 20 mg by mouth daily as needed (Heartburn or acid reflux).     . pravastatin (PRAVACHOL) 40 MG tablet Take 40 mg by mouth at bedtime.   11  . prochlorperazine  (COMPAZINE) 10 MG tablet Take 1 tablet (10 mg total) by mouth every 6 (six) hours as needed (Nausea or vomiting). 60 tablet 2  . spironolactone (ALDACTONE) 25 MG tablet Take 25 mg by mouth daily.     Marland Kitchen zinc oxide ([employer] ZINC OXIDE) 20 % ointment Apply 1 application topically 2 (two) times daily as needed for irritation.      No current facility-administered medications for this visit.    OBJECTIVE: Vitals:   05/19/19 1031  BP: 113/81  Pulse: 74  Resp: 20  Temp: (!) 95.3 F (35.2 C)  SpO2: 100%     Body mass index is 28.22 kg/m.    ECOG FS:0 - Asymptomatic  General: Well-developed, well-nourished, no acute distress.  Sitting in a wheelchair. Eyes: Pink conjunctiva, anicteric sclera. HEENT: Normocephalic, moist mucous membranes. Lungs: No audible wheezing or coughing. Heart:  Regular rate and rhythm. Abdomen: Soft, nontender, no obvious distention. Musculoskeletal: No edema, cyanosis, or clubbing. Neuro: Alert, mildly confused.  Cranial nerves grossly intact. Skin: No rashes or petechiae noted. Psych: Normal affect.  LAB RESULTS:  Lab Results  Component Value Date   NA 136 05/19/2019   K 4.4 05/19/2019   CL 103 05/19/2019   CO2 25 05/19/2019   GLUCOSE 86 05/19/2019   BUN 25 (H) 05/19/2019   CREATININE 1.50 (H) 05/19/2019   CALCIUM 9.3 05/19/2019   PROT 8.0 05/19/2019   ALBUMIN 4.1 05/19/2019   AST 25 05/19/2019   ALT 21 05/19/2019   ALKPHOS 71 05/19/2019   BILITOT 1.5 (H) 05/19/2019   GFRNONAA 44 (L) 05/19/2019   GFRAA 51 (L) 05/19/2019    Lab Results  Component Value Date   WBC 7.0 05/19/2019   NEUTROABS 5.5 05/19/2019   HGB 16.6 05/19/2019   HCT 51.1 05/19/2019   MCV 97.0 05/19/2019   PLT 114 (L) 05/19/2019     STUDIES: No results found.  ASSESSMENT: Invasive bladder cancer  PLAN:    1.  Invasive bladder cancer: Patient was diagnosed with a high-grade noninvasive bladder cancer on January 25, 2018.  He received 6 infusions of intravesicular  gemcitabine at that time.  More recently cystoscopy revealed invasive disease.  Patient is not a candidate for cystectomy or systemic chemotherapy, but he may benefit from immunotherapy treatment using Tecentriq.  CT scan results from April 02, 2019 reviewed independently with no obvious metastatic disease outside the patient's bladder.  He was noted to have interval improvement of asymmetric bladder wall thickening.  Repeat cystoscopy on May 15, 2019 revealed a 2 mm area in the right lateral wall concerning for recurrence, the bladder was otherwise normal.  TURBT was not completed secondary to patient's ongoing dementia.  Urology indicates repeat cystoscopy in 6 months, the patient's wife indicated that she would prefer to have repeat cystoscopy in 3 weeks for continued monitoring.  Proceed with cycle 8 of Tecentriq today.  Return to clinic in 3 weeks for further evaluation and consideration of cycle 9.   2.  Thrombocytopenia: Chronic and unchanged.  Platelet count is 114 today 3.  Elevated bilirubin: Chronic and unchanged.  Bilirubin is 1.5 today. 4.  Chronic renal insufficiency: Creatinine is trended up slightly to 1.5, monitor. 5.  Rash: Patient does not complain of this today.  Continue OTC treatments as needed. 6.  IV access: Patient declined port placement, but will consider 1 in the future if IV access becomes an issue.  Patient expressed understanding and was in agreement with this plan. He also understands that He can call clinic at any time with any questions, concerns, or complaints.     Lloyd Huger, MD   05/20/2019 6:17 AM

## 2019-05-19 ENCOUNTER — Encounter: Payer: Self-pay | Admitting: Oncology

## 2019-05-19 ENCOUNTER — Inpatient Hospital Stay: Payer: Medicare Other | Attending: Oncology

## 2019-05-19 ENCOUNTER — Other Ambulatory Visit: Payer: Self-pay | Admitting: Oncology

## 2019-05-19 ENCOUNTER — Other Ambulatory Visit: Payer: Self-pay

## 2019-05-19 ENCOUNTER — Inpatient Hospital Stay (HOSPITAL_BASED_OUTPATIENT_CLINIC_OR_DEPARTMENT_OTHER): Payer: Medicare Other | Admitting: Oncology

## 2019-05-19 ENCOUNTER — Inpatient Hospital Stay: Payer: Medicare Other

## 2019-05-19 VITALS — BP 113/81 | HR 74 | Temp 95.3°F | Resp 20 | Wt 202.3 lb

## 2019-05-19 DIAGNOSIS — C679 Malignant neoplasm of bladder, unspecified: Secondary | ICD-10-CM | POA: Diagnosis not present

## 2019-05-19 DIAGNOSIS — Z79899 Other long term (current) drug therapy: Secondary | ICD-10-CM | POA: Diagnosis not present

## 2019-05-19 DIAGNOSIS — Z5112 Encounter for antineoplastic immunotherapy: Secondary | ICD-10-CM | POA: Diagnosis present

## 2019-05-19 DIAGNOSIS — C689 Malignant neoplasm of urinary organ, unspecified: Secondary | ICD-10-CM

## 2019-05-19 LAB — CBC WITH DIFFERENTIAL/PLATELET
Abs Immature Granulocytes: 0.01 10*3/uL (ref 0.00–0.07)
Basophils Absolute: 0 10*3/uL (ref 0.0–0.1)
Basophils Relative: 1 %
Eosinophils Absolute: 0.3 10*3/uL (ref 0.0–0.5)
Eosinophils Relative: 4 %
HCT: 51.1 % (ref 39.0–52.0)
Hemoglobin: 16.6 g/dL (ref 13.0–17.0)
Immature Granulocytes: 0 %
Lymphocytes Relative: 9 %
Lymphs Abs: 0.7 10*3/uL (ref 0.7–4.0)
MCH: 31.5 pg (ref 26.0–34.0)
MCHC: 32.5 g/dL (ref 30.0–36.0)
MCV: 97 fL (ref 80.0–100.0)
Monocytes Absolute: 0.6 10*3/uL (ref 0.1–1.0)
Monocytes Relative: 8 %
Neutro Abs: 5.5 10*3/uL (ref 1.7–7.7)
Neutrophils Relative %: 78 %
Platelets: 114 10*3/uL — ABNORMAL LOW (ref 150–400)
RBC: 5.27 MIL/uL (ref 4.22–5.81)
RDW: 13.3 % (ref 11.5–15.5)
WBC: 7 10*3/uL (ref 4.0–10.5)
nRBC: 0 % (ref 0.0–0.2)

## 2019-05-19 LAB — COMPREHENSIVE METABOLIC PANEL
ALT: 21 U/L (ref 0–44)
AST: 25 U/L (ref 15–41)
Albumin: 4.1 g/dL (ref 3.5–5.0)
Alkaline Phosphatase: 71 U/L (ref 38–126)
Anion gap: 8 (ref 5–15)
BUN: 25 mg/dL — ABNORMAL HIGH (ref 8–23)
CO2: 25 mmol/L (ref 22–32)
Calcium: 9.3 mg/dL (ref 8.9–10.3)
Chloride: 103 mmol/L (ref 98–111)
Creatinine, Ser: 1.5 mg/dL — ABNORMAL HIGH (ref 0.61–1.24)
GFR calc Af Amer: 51 mL/min — ABNORMAL LOW (ref >60)
GFR calc non Af Amer: 44 mL/min — ABNORMAL LOW (ref >60)
Glucose, Bld: 86 mg/dL (ref 70–99)
Potassium: 4.4 mmol/L (ref 3.5–5.1)
Sodium: 136 mmol/L (ref 135–145)
Total Bilirubin: 1.5 mg/dL — ABNORMAL HIGH (ref 0.3–1.2)
Total Protein: 8 g/dL (ref 6.5–8.1)

## 2019-05-19 MED ORDER — SODIUM CHLORIDE 0.9 % IV SOLN
1200.0000 mg | Freq: Once | INTRAVENOUS | Status: AC
  Administered 2019-05-19: 12:00:00 1200 mg via INTRAVENOUS
  Filled 2019-05-19: qty 20

## 2019-05-19 MED ORDER — SODIUM CHLORIDE 0.9 % IV SOLN
Freq: Once | INTRAVENOUS | Status: AC
  Filled 2019-05-19: qty 250

## 2019-05-19 NOTE — Progress Notes (Signed)
Pt becomes increasingly confused at time of discharge. Pt refused to leave and asking questions unrelated to treatment, such as "how much are the tickets to the football game?" Pt. Remains pleasant. Pt escorted to lobby where we meet patient's spouse [name]. Pt continues to appear confused and is content with sitting in the infusion chair and not leaving. Once pt was escorted out to truck, pt transferred to truck passenger seat without difficulty. Pt stable at time of discharge.

## 2019-05-20 LAB — THYROID PANEL WITH TSH
Free Thyroxine Index: 2.1 (ref 1.2–4.9)
T3 Uptake Ratio: 29 % (ref 24–39)
T4, Total: 7.3 ug/dL (ref 4.5–12.0)
TSH: 2.15 u[IU]/mL (ref 0.450–4.500)

## 2019-06-06 NOTE — Progress Notes (Signed)
[place]  Telephone:(336) [phone] [phone]) [phone]  ID: Kyle Gregory OB: [birth year]  MR#: [id number]  [id number]  Patient Care Team: Rusty Aus, MD as PCP - General (Internal Medicine)  CHIEF COMPLAINT: Invasive bladder cancer  INTERVAL HISTORY: Patient returns to clinic today for further evaluation and consideration of cycle 9 of Tecentriq.  He continues to be a poor historian and is accompanied by a caregiver today. He currently feels well and is baseline. He continues to tolerate his treatments well without significant side effects.  He has no neurologic complaints.  He denies any recent fevers or illnesses.  He has a good appetite and denies weight loss.  He denies any chest pain, shortness of breath, cough, or hemoptysis.  He denies any nausea, vomiting, constipation, or diarrhea.  He has no urinary complaints.  He denies hematuria.  Patient offers no specific complaints today.  REVIEW OF SYSTEMS:   Review of Systems  Constitutional: Negative.  Negative for fever, malaise/fatigue and weight loss.  Respiratory: Negative.  Negative for cough and shortness of breath.   Cardiovascular: Negative.  Negative for chest pain and leg swelling.  Gastrointestinal: Negative.  Negative for abdominal pain.  Genitourinary: Negative.  Negative for dysuria and hematuria.  Musculoskeletal: Negative.  Negative for back pain.  Skin: Negative.  Negative for rash.  Neurological: Negative.  Negative for focal weakness, weakness and headaches.  Psychiatric/Behavioral: Positive for memory loss. The patient is not nervous/anxious.     As per HPI. Otherwise, a complete review of systems is negative.  PAST MEDICAL HISTORY: Past Medical History:  Diagnosis Date  . Anemia   . Aortic stenosis   . Arrhythmia   . Atrial fibrillation ([place])   . Bladder cancer ([place])   . Broken skin    buttocks  . Cardiomyopathy ([place])   . CHF (congestive heart failure) ([place])   . COPD  (chronic obstructive pulmonary disease) ([place])   . Coronary artery disease   . Dementia ([place])   . Dysrhythmia   . Elevated lipids   . GERD (gastroesophageal reflux disease)   . Heartburn   . Lower extremity edema   . Presence of permanent cardiac pacemaker   . Stroke ([place])    x 2 after heart surgery  . VRE (vancomycin resistant enterococcus) culture positive     PAST SURGICAL HISTORY: Past Surgical History:  Procedure Laterality Date  . CARDIAC VALVE REPLACEMENT    . 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 CYSTOSCOPY W/ RETROGRADES Bilateral 08/14/2016   Procedure: CYSTOSCOPY WITH RETROGRADE PYELOGRAM;  Surgeon: Hollice Espy, MD;  Location: [facility];  Service: Urology;  Laterality: Bilateral;  . LEFT HEART CATH AND CORONARY ANGIOGRAPHY Left 09/05/2016   Procedure: Left Heart Cath and Coronary Angiography;  Surgeon: Teodoro Spray, MD;  Location: [name];  Service: Cardiovascular;  Laterality: Left;  . neck growth removal     1970's  . PACEMAKER INSERTION N/A 06/24/2018   Procedure: INSERTION PACEMAKER;  Surgeon: Isaias Cowman, MD;  Location: [facility];  Service: Cardiovascular;  Laterality: N/A;  . TEMPORARY PACEMAKER N/A 06/22/2018   Procedure: TEMPORARY PACEMAKER;  Surgeon: Yolonda Kida, MD;  Location: [place] [place];  Service: Cardiovascular;  Laterality: N/A;  . TONSILLECTOMY    . TRANSURETHRAL RESECTION OF BLADDER TUMOR N/A 08/14/2016   Procedure: TRANSURETHRAL RESECTION OF BLADDER TUMOR (TURBT);  Surgeon: Hollice Espy, MD;  Location: [facility];  Service: Urology;  Laterality: N/A;  . TRANSURETHRAL RESECTION OF BLADDER TUMOR N/A 01/25/2018   Procedure: TRANSURETHRAL RESECTION OF BLADDER TUMOR (TURBT);  Surgeon: Billey Co, MD;  Location: [facility];  Service: Urology;  Laterality: N/A;  . TRANSURETHRAL RESECTION OF BLADDER TUMOR N/A 11/06/2018   Procedure: TRANSURETHRAL RESECTION OF BLADDER TUMOR (TURBT);   Surgeon: Billey Co, MD;  Location: [facility];  Service: Urology;  Laterality: N/A;    FAMILY HISTORY: Family History  Problem Relation Age of Onset  . Heart failure Father   . Heart failure Brother   . Atrial fibrillation Sister   . Prostate cancer Neg Hx   . Kidney cancer Neg Hx   . Bladder Cancer Neg Hx     ADVANCED DIRECTIVES (Y/N):  N  HEALTH MAINTENANCE: Social History   Tobacco Use  . Smoking status: Never Smoker  . Smokeless tobacco: Never Used  Substance Use Topics  . Alcohol use: No  . Drug use: No     Colonoscopy:  PAP:  Bone density:  Lipid panel:  Allergies  Allergen Reactions  . Amiodarone Other (See Comments)    Dizziness  . Donepezil Palpitations    Severe bradycardia    Current Outpatient Medications  Medication Sig Dispense Refill  . apixaban (ELIQUIS) 5 MG TABS tablet Take 1 tablet (5 mg total) by mouth 2 (two) times daily. 60 tablet 0  . Calcium Carb-Cholecalciferol (OYSTER SHELL CALCIUM) 500-400 MG-UNIT TABS Take by mouth.    . Calcium Carbonate-Vitamin D (OYSTER SHELL CALCIUM 500 + D) 500-125 MG-UNIT TABS Take 1 tablet by mouth daily.     . ferrous sulfate 325 (65 FE) MG tablet Take 325 mg by mouth 2 (two) times daily with a meal.    . FLUoxetine (PROZAC) 20 MG capsule Take 20 mg by mouth daily.     . furosemide (LASIX) 20 MG tablet Take 20 mg by mouth daily as needed (swelling feet/ankles.).     Marland Kitchen Melatonin 3 MG TABS Take 3 mg by mouth at bedtime.     . nitroGLYCERIN (NITROSTAT) 0.4 MG SL tablet Place 0.4 mg under the tongue every 5 (five) minutes as needed for chest pain (Up to 3 doses).     Marland Kitchen omeprazole (PRILOSEC) 20 MG capsule Take 20 mg by mouth daily as needed (Heartburn or acid reflux).     . pravastatin (PRAVACHOL) 40 MG tablet Take 40 mg by mouth at bedtime.   11  . prochlorperazine (COMPAZINE) 10 MG tablet Take 1 tablet (10 mg total) by mouth every 6 (six) hours as needed (Nausea or vomiting). 60 tablet 2  . spironolactone  (ALDACTONE) 25 MG tablet Take 25 mg by mouth daily.     Marland Kitchen zinc oxide ([employer] ZINC OXIDE) 20 % ointment Apply 1 application topically 2 (two) times daily as needed for irritation.      No current facility-administered medications for this visit.   Facility-Administered Medications Ordered in Other Visits  Medication Dose Route Frequency Provider Last Rate Last Admin  . 0.9 %  sodium chloride infusion   Intravenous Once Lloyd Huger, MD        OBJECTIVE: Vitals:   06/09/19 1051  BP: 117/79  Pulse: 70  Resp: 18  Temp: (!) 95.1 F (35.1 C)  SpO2: 100%     Body mass index is 28.03 kg/m.    ECOG FS:0 - Asymptomatic  General: Well-developed, well-nourished, no acute distress.  Sitting in a wheelchair. Eyes: Pink conjunctiva, anicteric sclera. HEENT: Normocephalic, moist  mucous membranes. Lungs: No audible wheezing or coughing. Heart: Regular rate and rhythm. Abdomen: Soft, nontender, no obvious distention. Musculoskeletal: No edema, cyanosis, or clubbing. Neuro: Alert, answering all questions appropriately. Cranial nerves grossly intact. Skin: No rashes or petechiae noted. Psych: Normal affect.  LAB RESULTS:  Lab Results  Component Value Date   NA 134 (L) 06/09/2019   K 4.5 06/09/2019   CL 101 06/09/2019   CO2 25 06/09/2019   GLUCOSE 86 06/09/2019   BUN 37 (H) 06/09/2019   CREATININE 1.30 (H) 06/09/2019   CALCIUM 9.1 06/09/2019   PROT 8.1 06/09/2019   ALBUMIN 4.0 06/09/2019   AST 29 06/09/2019   ALT 28 06/09/2019   ALKPHOS 83 06/09/2019   BILITOT 1.3 (H) 06/09/2019   GFRNONAA 52 (L) 06/09/2019   GFRAA >60 06/09/2019    Lab Results  Component Value Date   WBC 7.3 06/09/2019   NEUTROABS 5.4 06/09/2019   HGB 16.0 06/09/2019   HCT 49.3 06/09/2019   MCV 97.8 06/09/2019   PLT 120 (L) 06/09/2019     STUDIES: No results found.  ASSESSMENT: Invasive bladder cancer  PLAN:    1.  Invasive bladder cancer: Patient was diagnosed with a high-grade noninvasive  bladder cancer on January 25, 2018.  He received 6 infusions of intravesicular gemcitabine at that time.  More recently cystoscopy revealed invasive disease.  Patient is not a candidate for cystectomy or systemic chemotherapy, but he may benefit from immunotherapy treatment using Tecentriq.  CT scan results from April 02, 2019 reviewed independently with no obvious metastatic disease outside the patient's bladder.  He was noted to have interval improvement of asymmetric bladder wall thickening.  Repeat cystoscopy on May 15, 2019 revealed a 2 mm area in the right lateral wall concerning for recurrence, the bladder was otherwise normal.  TURBT was not completed secondary to patient's ongoing dementia.  Urology indicates repeat cystoscopy in 6 months, the patient's wife indicated that she would prefer to have repeat cystoscopy in 3 months for continued monitoring.  Patient was scheduled to receive cycle 9 of Tecentriq today, but became agitated and uncooperative in the infusion suite and was unable to receive treatment.  We will keep follow-up as scheduled in 3 weeks for reconsideration of cycle 9. 2.  Thrombocytopenia: Chronic and unchanged.  Platelet count is 120 today. 3.  Elevated bilirubin: Chronic and unchanged.  Bilirubin is 1.3 today. 4.  Chronic renal insufficiency: Creatinine improved to 1.3 today. 5.  Rash: Patient does not complain of this today.  Continue OTC treatments as needed. 6.  IV access: Patient declined port placement, but will consider 1 in the future if IV access becomes an issue.   Patient expressed understanding and was in agreement with this plan. He also understands that He can call clinic at any time with any questions, concerns, or complaints.     Lloyd Huger, MD   06/09/2019 1:45 PM

## 2019-06-09 ENCOUNTER — Inpatient Hospital Stay: Payer: Medicare Other | Attending: Oncology

## 2019-06-09 ENCOUNTER — Other Ambulatory Visit: Payer: Self-pay

## 2019-06-09 ENCOUNTER — Inpatient Hospital Stay (HOSPITAL_BASED_OUTPATIENT_CLINIC_OR_DEPARTMENT_OTHER): Payer: Medicare Other | Admitting: Oncology

## 2019-06-09 ENCOUNTER — Inpatient Hospital Stay: Payer: Medicare Other

## 2019-06-09 ENCOUNTER — Encounter: Payer: Self-pay | Admitting: Oncology

## 2019-06-09 VITALS — BP 117/79 | HR 70 | Temp 95.1°F | Resp 18 | Wt 201.0 lb

## 2019-06-09 DIAGNOSIS — Z5112 Encounter for antineoplastic immunotherapy: Secondary | ICD-10-CM | POA: Insufficient documentation

## 2019-06-09 DIAGNOSIS — C689 Malignant neoplasm of urinary organ, unspecified: Secondary | ICD-10-CM

## 2019-06-09 DIAGNOSIS — Z79899 Other long term (current) drug therapy: Secondary | ICD-10-CM | POA: Diagnosis not present

## 2019-06-09 DIAGNOSIS — C679 Malignant neoplasm of bladder, unspecified: Secondary | ICD-10-CM

## 2019-06-09 LAB — COMPREHENSIVE METABOLIC PANEL
ALT: 28 U/L (ref 0–44)
AST: 29 U/L (ref 15–41)
Albumin: 4 g/dL (ref 3.5–5.0)
Alkaline Phosphatase: 83 U/L (ref 38–126)
Anion gap: 8 (ref 5–15)
BUN: 37 mg/dL — ABNORMAL HIGH (ref 8–23)
CO2: 25 mmol/L (ref 22–32)
Calcium: 9.1 mg/dL (ref 8.9–10.3)
Chloride: 101 mmol/L (ref 98–111)
Creatinine, Ser: 1.3 mg/dL — ABNORMAL HIGH (ref 0.61–1.24)
GFR calc Af Amer: >60 mL/min (ref >60)
GFR calc non Af Amer: 52 mL/min — ABNORMAL LOW (ref >60)
Glucose, Bld: 86 mg/dL (ref 70–99)
Potassium: 4.5 mmol/L (ref 3.5–5.1)
Sodium: 134 mmol/L — ABNORMAL LOW (ref 135–145)
Total Bilirubin: 1.3 mg/dL — ABNORMAL HIGH (ref 0.3–1.2)
Total Protein: 8.1 g/dL (ref 6.5–8.1)

## 2019-06-09 LAB — CBC WITH DIFFERENTIAL/PLATELET
Abs Immature Granulocytes: 0.02 10*3/uL (ref 0.00–0.07)
Basophils Absolute: 0.1 10*3/uL (ref 0.0–0.1)
Basophils Relative: 1 %
Eosinophils Absolute: 0.3 10*3/uL (ref 0.0–0.5)
Eosinophils Relative: 4 %
HCT: 49.3 % (ref 39.0–52.0)
Hemoglobin: 16 g/dL (ref 13.0–17.0)
Immature Granulocytes: 0 %
Lymphocytes Relative: 13 %
Lymphs Abs: 0.9 10*3/uL (ref 0.7–4.0)
MCH: 31.7 pg (ref 26.0–34.0)
MCHC: 32.5 g/dL (ref 30.0–36.0)
MCV: 97.8 fL (ref 80.0–100.0)
Monocytes Absolute: 0.6 10*3/uL (ref 0.1–1.0)
Monocytes Relative: 9 %
Neutro Abs: 5.4 10*3/uL (ref 1.7–7.7)
Neutrophils Relative %: 73 %
Platelets: 120 10*3/uL — ABNORMAL LOW (ref 150–400)
RBC: 5.04 MIL/uL (ref 4.22–5.81)
RDW: 13.1 % (ref 11.5–15.5)
WBC: 7.3 10*3/uL (ref 4.0–10.5)
nRBC: 0 % (ref 0.0–0.2)

## 2019-06-09 MED ORDER — SODIUM CHLORIDE 0.9 % IV SOLN
Freq: Once | INTRAVENOUS | Status: AC
  Filled 2019-06-09: qty 250

## 2019-06-09 MED ORDER — SODIUM CHLORIDE 0.9 % IV SOLN
1200.0000 mg | Freq: Once | INTRAVENOUS | Status: AC
  Administered 2019-06-09: 12:00:00 1200 mg via INTRAVENOUS
  Filled 2019-06-09: qty 20

## 2019-06-09 NOTE — Progress Notes (Signed)
Patient is here today for follow up, his wife mentioned some dark colored urine and Gay Filler the caretaker mentioned it as well

## 2019-06-09 NOTE — Progress Notes (Unsigned)
Patient treatment started at 1159. Patient immediately started saying he had to go and couldn't stay. Very confused, difficult, unable to reason with patient. Treatment stopped and  IV taken out for safety reasons and caregiver called to come pick patient up. Dr Grayland Ormond aware.

## 2019-06-10 LAB — THYROID PANEL WITH TSH
Free Thyroxine Index: 1.8 (ref 1.2–4.9)
T3 Uptake Ratio: 29 % (ref 24–39)
T4, Total: 6.3 ug/dL (ref 4.5–12.0)
TSH: 1.77 u[IU]/mL (ref 0.450–4.500)

## 2019-06-28 NOTE — Progress Notes (Signed)
[place]  Telephone:(336) [phone] Fax:(336) [phone]  ID: [name] OB: [birth year]  MR#: [id number]  [id number]  Patient Care Team: Rusty Aus, MD as PCP - General (Internal Medicine)  CHIEF COMPLAINT: Invasive bladder cancer  INTERVAL HISTORY: Patient returns to clinic today for further evaluation and reconsideration of cycle 9 of Tecentriq.  He is a poor historian given his underlying dementia and is accompanied by his wife today. He currently feels well and is baseline. He continues to tolerate his treatments well without significant side effects.  He has no neurologic complaints.  He denies any recent fevers or illnesses.  He has a good appetite and denies weight loss.  He denies any chest pain, shortness of breath, cough, or hemoptysis.  He denies any nausea, vomiting, constipation, or diarrhea.  He has no urinary complaints.  He denies hematuria.  Patient offers no specific complaints today.  REVIEW OF SYSTEMS:   Review of Systems  Constitutional: Negative.  Negative for fever, malaise/fatigue and weight loss.  Respiratory: Negative.  Negative for cough and shortness of breath.   Cardiovascular: Negative.  Negative for chest pain and leg swelling.  Gastrointestinal: Negative.  Negative for abdominal pain.  Genitourinary: Negative.  Negative for dysuria and hematuria.  Musculoskeletal: Negative.  Negative for back pain.  Skin: Negative.  Negative for rash.  Neurological: Negative.  Negative for focal weakness, weakness and headaches.  Psychiatric/Behavioral: Positive for memory loss. The patient is not nervous/anxious.     As per HPI. Otherwise, a complete review of systems is negative.  PAST MEDICAL HISTORY: Past Medical History:  Diagnosis Date  . Anemia   . Aortic stenosis   . Arrhythmia   . Atrial fibrillation ([place])   . Bladder cancer ([place])   . Broken skin    buttocks  . Cardiomyopathy ([place])   . CHF (congestive heart failure) ([place])     . COPD (chronic obstructive pulmonary disease) ([place])   . Coronary artery disease   . Dementia ([place])   . Dysrhythmia   . Elevated lipids   . GERD (gastroesophageal reflux disease)   . Heartburn   . Lower extremity edema   . Presence of permanent cardiac pacemaker   . Stroke ([place])    x 2 after heart surgery  . VRE (vancomycin resistant enterococcus) culture positive     PAST SURGICAL HISTORY: Past Surgical History:  Procedure Laterality Date  . CARDIAC VALVE REPLACEMENT    . 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 CYSTOSCOPY W/ RETROGRADES Bilateral 08/14/2016   Procedure: CYSTOSCOPY WITH RETROGRADE PYELOGRAM;  Surgeon: Hollice Espy, MD;  Location: [facility];  Service: Urology;  Laterality: Bilateral;  . LEFT HEART CATH AND CORONARY ANGIOGRAPHY Left 09/05/2016   Procedure: Left Heart Cath and Coronary Angiography;  Surgeon: Teodoro Spray, MD;  Location: [place] [facility];  Service: Cardiovascular;  Laterality: Left;  . neck growth removal     1970's  . PACEMAKER INSERTION N/A 06/24/2018   Procedure: INSERTION PACEMAKER;  Surgeon: Isaias Cowman, MD;  Location: [facility];  Service: Cardiovascular;  Laterality: N/A;  . TEMPORARY PACEMAKER N/A 06/22/2018   Procedure: TEMPORARY PACEMAKER;  Surgeon: Yolonda Kida, MD;  Location: [place] [facility];  Service: Cardiovascular;  Laterality: N/A;  . TONSILLECTOMY    . TRANSURETHRAL RESECTION OF BLADDER TUMOR N/A 08/14/2016   Procedure: TRANSURETHRAL RESECTION OF BLADDER TUMOR (TURBT);  Surgeon: Hollice Espy, MD;  Location: [facility];  Service: Urology;  Laterality: N/A;  . TRANSURETHRAL RESECTION OF BLADDER TUMOR N/A 01/25/2018   Procedure: TRANSURETHRAL RESECTION OF BLADDER TUMOR (TURBT);  Surgeon: Billey Co, MD;  Location: [facility];  Service: Urology;  Laterality: N/A;  . TRANSURETHRAL RESECTION OF BLADDER TUMOR N/A 11/06/2018   Procedure: TRANSURETHRAL RESECTION OF BLADDER TUMOR  (TURBT);  Surgeon: Billey Co, MD;  Location: [facility];  Service: Urology;  Laterality: N/A;    FAMILY HISTORY: Family History  Problem Relation Age of Onset  . Heart failure Father   . Heart failure Brother   . Atrial fibrillation Sister   . Prostate cancer Neg Hx   . Kidney cancer Neg Hx   . Bladder Cancer Neg Hx     ADVANCED DIRECTIVES (Y/N):  N  HEALTH MAINTENANCE: Social History   Tobacco Use  . Smoking status: Never Smoker  . Smokeless tobacco: Never Used  Substance Use Topics  . Alcohol use: No  . Drug use: No     Colonoscopy:  PAP:  Bone density:  Lipid panel:  Allergies  Allergen Reactions  . Amiodarone Other (See Comments)    Dizziness  . Donepezil Palpitations    Severe bradycardia    Current Outpatient Medications  Medication Sig Dispense Refill  . apixaban (ELIQUIS) 5 MG TABS tablet Take 1 tablet (5 mg total) by mouth 2 (two) times daily. 60 tablet 0  . Calcium Carb-Cholecalciferol (OYSTER SHELL CALCIUM) 500-400 MG-UNIT TABS Take by mouth.    . ferrous sulfate 325 (65 FE) MG tablet Take 325 mg by mouth 2 (two) times daily with a meal.    . FLUoxetine (PROZAC) 20 MG capsule Take 20 mg by mouth daily.     . furosemide (LASIX) 20 MG tablet Take 20 mg by mouth daily as needed (swelling feet/ankles.).     Marland Kitchen Melatonin 3 MG TABS Take 3 mg by mouth at bedtime.     . nitroGLYCERIN (NITROSTAT) 0.4 MG SL tablet Place 0.4 mg under the tongue every 5 (five) minutes as needed for chest pain (Up to 3 doses).     Marland Kitchen omeprazole (PRILOSEC) 20 MG capsule Take 20 mg by mouth daily as needed (Heartburn or acid reflux).     . pravastatin (PRAVACHOL) 40 MG tablet Take 40 mg by mouth at bedtime.   11  . spironolactone (ALDACTONE) 25 MG tablet Take 25 mg by mouth daily.     Marland Kitchen zinc oxide ([employer] ZINC OXIDE) 20 % ointment Apply 1 application topically 2 (two) times daily as needed for irritation.     . prochlorperazine (COMPAZINE) 10 MG tablet Take 1 tablet (10 mg total) by  mouth every 6 (six) hours as needed (Nausea or vomiting). (Patient not taking: Reported on 06/30/2019) 60 tablet 2   No current facility-administered medications for this visit.   Facility-Administered Medications Ordered in Other Visits  Medication Dose Route Frequency Provider Last Rate Last Admin  . 0.9 %  sodium chloride infusion   Intravenous Once Lloyd Huger, MD        OBJECTIVE: Vitals:   06/30/19 0922  BP: 105/68  Pulse: 82  Resp: 16  Temp: (!) 95.8 F (35.4 C)  SpO2: 100%     Body mass index is 28.41 kg/m.    ECOG FS:0 - Asymptomatic  General: Well-developed, well-nourished, no acute distress.  Sitting in a wheelchair. Eyes: Pink conjunctiva, anicteric sclera. HEENT: Normocephalic, moist mucous membranes. Lungs: No audible wheezing or coughing. Heart: Regular rate and rhythm. Abdomen:  Soft, nontender, no obvious distention. Musculoskeletal: No edema, cyanosis, or clubbing. Neuro: Alert, confused. Cranial nerves grossly intact. Skin: No rashes or petechiae noted. Psych: Normal affect.   LAB RESULTS:  Lab Results  Component Value Date   NA 137 06/30/2019   K 4.3 06/30/2019   CL 103 06/30/2019   CO2 24 06/30/2019   GLUCOSE 86 06/30/2019   BUN 28 (H) 06/30/2019   CREATININE 1.50 (H) 06/30/2019   CALCIUM 9.1 06/30/2019   PROT 7.6 06/30/2019   ALBUMIN 3.8 06/30/2019   AST 25 06/30/2019   ALT 22 06/30/2019   ALKPHOS 66 06/30/2019   BILITOT 1.1 06/30/2019   GFRNONAA 44 (L) 06/30/2019   GFRAA 51 (L) 06/30/2019    Lab Results  Component Value Date   WBC 6.4 06/30/2019   NEUTROABS 4.8 06/30/2019   HGB 15.6 06/30/2019   HCT 48.2 06/30/2019   MCV 98.2 06/30/2019   PLT 106 (L) 06/30/2019     STUDIES: No results found.  ASSESSMENT: Invasive bladder cancer  PLAN:    1.  Invasive bladder cancer: Patient was diagnosed with a high-grade noninvasive bladder cancer on January 25, 2018.  He received 6 infusions of intravesicular gemcitabine at that  time.  More recently cystoscopy revealed invasive disease.  Patient is not a candidate for cystectomy or systemic chemotherapy, but he may benefit from immunotherapy treatment using Tecentriq.  CT scan results from April 02, 2019 reviewed independently with no obvious metastatic disease outside the patient's bladder.  He was noted to have interval improvement of asymmetric bladder wall thickening.  Repeat cystoscopy on May 15, 2019 revealed a 2 mm area in the right lateral wall concerning for recurrence, the bladder was otherwise normal.  TURBT was not completed secondary to patient's ongoing dementia.  Urology indicates repeat cystoscopy in 6 months, the patient's wife indicated that she would prefer to have repeat cystoscopy in 3 months for continued monitoring.  Proceed with cycle 9 of treatment today.  Return to clinic in 3 weeks for further evaluation and consideration of cycle 10. 2.  Thrombocytopenia: Chronic and unchanged.  Platelet count has trended down slightly to 106. 3.  Elevated bilirubin: Resolved. 4.  Chronic renal insufficiency: Chronic and unchanged.  Patient's creatinine is 1.5 today. 5.  Rash: Patient does not complain of this today.  Continue OTC treatments as needed. 6.  IV access: Patient declined port placement, but will consider one in the future if IV access becomes an issue.   Patient expressed understanding and was in agreement with this plan. He also understands that He can call clinic at any time with any questions, concerns, or complaints.     Lloyd Huger, MD   06/30/2019 12:28 PM

## 2019-06-30 ENCOUNTER — Inpatient Hospital Stay: Payer: Medicare Other

## 2019-06-30 ENCOUNTER — Other Ambulatory Visit: Payer: Self-pay

## 2019-06-30 ENCOUNTER — Inpatient Hospital Stay (HOSPITAL_BASED_OUTPATIENT_CLINIC_OR_DEPARTMENT_OTHER): Payer: Medicare Other | Admitting: Oncology

## 2019-06-30 ENCOUNTER — Inpatient Hospital Stay: Payer: Medicare Other | Attending: Oncology

## 2019-06-30 ENCOUNTER — Encounter: Payer: Self-pay | Admitting: Oncology

## 2019-06-30 VITALS — BP 105/68 | HR 82 | Temp 95.8°F | Resp 16 | Wt 203.7 lb

## 2019-06-30 DIAGNOSIS — C679 Malignant neoplasm of bladder, unspecified: Secondary | ICD-10-CM

## 2019-06-30 DIAGNOSIS — Z79899 Other long term (current) drug therapy: Secondary | ICD-10-CM | POA: Diagnosis not present

## 2019-06-30 DIAGNOSIS — Z5112 Encounter for antineoplastic immunotherapy: Secondary | ICD-10-CM | POA: Diagnosis not present

## 2019-06-30 DIAGNOSIS — C689 Malignant neoplasm of urinary organ, unspecified: Secondary | ICD-10-CM

## 2019-06-30 LAB — COMPREHENSIVE METABOLIC PANEL
ALT: 22 U/L (ref 0–44)
AST: 25 U/L (ref 15–41)
Albumin: 3.8 g/dL (ref 3.5–5.0)
Alkaline Phosphatase: 66 U/L (ref 38–126)
Anion gap: 10 (ref 5–15)
BUN: 28 mg/dL — ABNORMAL HIGH (ref 8–23)
CO2: 24 mmol/L (ref 22–32)
Calcium: 9.1 mg/dL (ref 8.9–10.3)
Chloride: 103 mmol/L (ref 98–111)
Creatinine, Ser: 1.5 mg/dL — ABNORMAL HIGH (ref 0.61–1.24)
GFR calc Af Amer: 51 mL/min — ABNORMAL LOW (ref >60)
GFR calc non Af Amer: 44 mL/min — ABNORMAL LOW (ref >60)
Glucose, Bld: 86 mg/dL (ref 70–99)
Potassium: 4.3 mmol/L (ref 3.5–5.1)
Sodium: 137 mmol/L (ref 135–145)
Total Bilirubin: 1.1 mg/dL (ref 0.3–1.2)
Total Protein: 7.6 g/dL (ref 6.5–8.1)

## 2019-06-30 LAB — CBC WITH DIFFERENTIAL/PLATELET
Abs Immature Granulocytes: 0.02 10*3/uL (ref 0.00–0.07)
Basophils Absolute: 0 10*3/uL (ref 0.0–0.1)
Basophils Relative: 1 %
Eosinophils Absolute: 0.3 10*3/uL (ref 0.0–0.5)
Eosinophils Relative: 4 %
HCT: 48.2 % (ref 39.0–52.0)
Hemoglobin: 15.6 g/dL (ref 13.0–17.0)
Immature Granulocytes: 0 %
Lymphocytes Relative: 13 %
Lymphs Abs: 0.9 10*3/uL (ref 0.7–4.0)
MCH: 31.8 pg (ref 26.0–34.0)
MCHC: 32.4 g/dL (ref 30.0–36.0)
MCV: 98.2 fL (ref 80.0–100.0)
Monocytes Absolute: 0.4 10*3/uL (ref 0.1–1.0)
Monocytes Relative: 6 %
Neutro Abs: 4.8 10*3/uL (ref 1.7–7.7)
Neutrophils Relative %: 76 %
Platelets: 106 10*3/uL — ABNORMAL LOW (ref 150–400)
RBC: 4.91 MIL/uL (ref 4.22–5.81)
RDW: 13.1 % (ref 11.5–15.5)
WBC: 6.4 10*3/uL (ref 4.0–10.5)
nRBC: 0 % (ref 0.0–0.2)

## 2019-06-30 MED ORDER — SODIUM CHLORIDE 0.9 % IV SOLN
1200.0000 mg | Freq: Once | INTRAVENOUS | Status: AC
  Administered 2019-06-30: 1200 mg via INTRAVENOUS
  Filled 2019-06-30: qty 20

## 2019-06-30 MED ORDER — SODIUM CHLORIDE 0.9 % IV SOLN
Freq: Once | INTRAVENOUS | Status: AC
  Filled 2019-06-30: qty 250

## 2019-06-30 NOTE — Progress Notes (Signed)
Patient does not offer any problems today.  

## 2019-07-01 LAB — THYROID PANEL WITH TSH
Free Thyroxine Index: 1.8 (ref 1.2–4.9)
T3 Uptake Ratio: 28 % (ref 24–39)
T4, Total: 6.4 ug/dL (ref 4.5–12.0)
TSH: 2.18 u[IU]/mL (ref 0.450–4.500)

## 2019-07-18 ENCOUNTER — Encounter: Payer: Self-pay | Admitting: Oncology

## 2019-07-18 NOTE — Progress Notes (Signed)
Spoke with patients wife, he is doing well no complaints

## 2019-07-18 NOTE — Progress Notes (Signed)
[place]  Telephone:(336) [phone] Fax:(336) [phone]  ID: Kyle Gregory OB: Mar 06, 1941  MR#: [id number]  [id number]  Patient Care Team: Rusty Aus, MD as PCP - General (Internal Medicine)  CHIEF COMPLAINT: Invasive bladder cancer  INTERVAL HISTORY: Patient returns to clinic today for further evaluation and consideration of cycle 10 of Tecentriq.  He continues to feel well and remains asymptomatic. He is a poor historian given his underlying dementia and is accompanied by his wife today.  He continues to tolerate his treatments well without significant side effects.  He has no neurologic complaints.  He denies any recent fevers or illnesses.  He has a good appetite and denies weight loss.  He denies any chest pain, shortness of breath, cough, or hemoptysis.  He denies any nausea, vomiting, constipation, or diarrhea.  He has no urinary complaints.  He denies hematuria.  Patient offers no specific complaints today.    REVIEW OF SYSTEMS:   Review of Systems  Constitutional: Negative.  Negative for fever, malaise/fatigue and weight loss.  Respiratory: Negative.  Negative for cough and shortness of breath.   Cardiovascular: Negative.  Negative for chest pain and leg swelling.  Gastrointestinal: Negative.  Negative for abdominal pain.  Genitourinary: Negative.  Negative for dysuria and hematuria.  Musculoskeletal: Negative.  Negative for back pain.  Skin: Negative.  Negative for rash.  Neurological: Negative.  Negative for focal weakness, weakness and headaches.  Psychiatric/Behavioral: Positive for memory loss. The patient is not nervous/anxious.     As per HPI. Otherwise, a complete review of systems is negative.  PAST MEDICAL HISTORY: Past Medical History:  Diagnosis Date  . Anemia   . Aortic stenosis   . Arrhythmia   . Atrial fibrillation ([place])   . Bladder cancer ([place])   . Broken skin    buttocks  . Cardiomyopathy ([place])   . CHF (congestive heart  failure) ([place])   . COPD (chronic obstructive pulmonary disease) ([place])   . Coronary artery disease   . Dementia ([place])   . Dysrhythmia   . Elevated lipids   . GERD (gastroesophageal reflux disease)   . Heartburn   . Lower extremity edema   . Presence of permanent cardiac pacemaker   . Stroke ([place])    x 2 after heart surgery  . VRE (vancomycin resistant enterococcus) culture positive     PAST SURGICAL HISTORY: Past Surgical History:  Procedure Laterality Date  . CARDIAC VALVE REPLACEMENT    . 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 CYSTOSCOPY W/ RETROGRADES Bilateral 08/14/2016   Procedure: CYSTOSCOPY WITH RETROGRADE PYELOGRAM;  Surgeon: Hollice Espy, MD;  Location: [facility];  Service: Urology;  Laterality: Bilateral;  . LEFT HEART CATH AND CORONARY ANGIOGRAPHY Left 09/05/2016   Procedure: Left Heart Cath and Coronary Angiography;  Surgeon: Teodoro Spray, MD;  Location: [facility];  Service: Cardiovascular;  Laterality: Left;  . neck growth removal     1970's  . PACEMAKER INSERTION N/A 06/24/2018   Procedure: INSERTION PACEMAKER;  Surgeon: Isaias Cowman, MD;  Location: [facility];  Service: Cardiovascular;  Laterality: N/A;  . TEMPORARY PACEMAKER N/A 06/22/2018   Procedure: TEMPORARY PACEMAKER;  Surgeon: Yolonda Kida, MD;  Location: [place] [facility];  Service: Cardiovascular;  Laterality: N/A;  . TONSILLECTOMY    . TRANSURETHRAL RESECTION OF BLADDER TUMOR N/A 08/14/2016   Procedure: TRANSURETHRAL RESECTION OF BLADDER TUMOR (TURBT);  Surgeon: Hollice Espy, MD;  Location: [facility];  Service: Urology;  Laterality: N/A;  . TRANSURETHRAL RESECTION OF BLADDER TUMOR N/A 01/25/2018   Procedure: TRANSURETHRAL RESECTION OF BLADDER TUMOR (TURBT);  Surgeon: Billey Co, MD;  Location: [facility];  Service: Urology;  Laterality: N/A;  . TRANSURETHRAL RESECTION OF BLADDER TUMOR N/A 11/06/2018   Procedure: TRANSURETHRAL RESECTION OF  BLADDER TUMOR (TURBT);  Surgeon: Billey Co, MD;  Location: [facility];  Service: Urology;  Laterality: N/A;    FAMILY HISTORY: Family History  Problem Relation Age of Onset  . Heart failure Father   . Heart failure Brother   . Atrial fibrillation Sister   . Prostate cancer Neg Hx   . Kidney cancer Neg Hx   . Bladder Cancer Neg Hx     ADVANCED DIRECTIVES (Y/N):  N  HEALTH MAINTENANCE: Social History   Tobacco Use  . Smoking status: Never Smoker  . Smokeless tobacco: Never Used  Substance Use Topics  . Alcohol use: No  . Drug use: No     Colonoscopy:  PAP:  Bone density:  Lipid panel:  Allergies  Allergen Reactions  . Amiodarone Other (See Comments)    Dizziness  . Donepezil Palpitations    Severe bradycardia    Current Outpatient Medications  Medication Sig Dispense Refill  . apixaban (ELIQUIS) 5 MG TABS tablet Take 1 tablet (5 mg total) by mouth 2 (two) times daily. 60 tablet 0  . Calcium Carb-Cholecalciferol (OYSTER SHELL CALCIUM) 500-400 MG-UNIT TABS Take by mouth.    . ferrous sulfate 325 (65 FE) MG tablet Take 325 mg by mouth 2 (two) times daily with a meal.    . FLUoxetine (PROZAC) 20 MG capsule Take 20 mg by mouth daily.     . furosemide (LASIX) 20 MG tablet Take 20 mg by mouth daily as needed (swelling feet/ankles.).     Marland Kitchen Melatonin 3 MG TABS Take 3 mg by mouth at bedtime.     . nitroGLYCERIN (NITROSTAT) 0.4 MG SL tablet Place 0.4 mg under the tongue every 5 (five) minutes as needed for chest pain (Up to 3 doses).     Marland Kitchen omeprazole (PRILOSEC) 20 MG capsule Take 20 mg by mouth daily as needed (Heartburn or acid reflux).     . pravastatin (PRAVACHOL) 40 MG tablet Take 40 mg by mouth at bedtime.   11  . spironolactone (ALDACTONE) 25 MG tablet Take 25 mg by mouth daily.     Marland Kitchen zinc oxide (CVS ZINC OXIDE) 20 % ointment Apply 1 application topically 2 (two) times daily as needed for irritation.     . prochlorperazine (COMPAZINE) 10 MG tablet Take 1 tablet (10  mg total) by mouth every 6 (six) hours as needed (Nausea or vomiting). (Patient not taking: Reported on 06/30/2019) 60 tablet 2   No current facility-administered medications for this visit.   Facility-Administered Medications Ordered in Other Visits  Medication Dose Route Frequency Provider Last Rate Last Admin  . 0.9 %  sodium chloride infusion   Intravenous Once Lloyd Huger, MD      . Huey Bienenstock [facility]) 1,200 mg in sodium chloride 0.9 % 250 mL chemo infusion  1,200 mg Intravenous Once Lloyd Huger, MD        OBJECTIVE: Vitals:   07/21/19 1309  BP: 122/69  Pulse: 70  Temp: 97.6 F (36.4 C)     Body mass index is 28.69 kg/m.    ECOG FS:0 - Asymptomatic  General: Well-developed, well-nourished, no acute distress.  Sitting  in a wheelchair. Eyes: Pink conjunctiva, anicteric sclera. HEENT: Normocephalic, moist mucous membranes. Lungs: No audible wheezing or coughing. Heart: Regular rate and rhythm. Abdomen: Soft, nontender, no obvious distention. Musculoskeletal: No edema, cyanosis, or clubbing. Neuro: Alert, answering all questions appropriately. Cranial nerves grossly intact. Skin: No rashes or petechiae noted. Psych: Normal affect.   LAB RESULTS:  Lab Results  Component Value Date   NA 136 07/21/2019   K 4.7 07/21/2019   CL 102 07/21/2019   CO2 25 07/21/2019   GLUCOSE 96 07/21/2019   BUN 29 (H) 07/21/2019   CREATININE 1.33 (H) 07/21/2019   CALCIUM 9.1 07/21/2019   PROT 7.7 07/21/2019   ALBUMIN 3.9 07/21/2019   AST 25 07/21/2019   ALT 23 07/21/2019   ALKPHOS 71 07/21/2019   BILITOT 1.5 (H) 07/21/2019   GFRNONAA 51 (L) 07/21/2019   GFRAA 59 (L) 07/21/2019    Lab Results  Component Value Date   WBC 8.1 07/21/2019   NEUTROABS 6.5 07/21/2019   HGB 16.2 07/21/2019   HCT 47.6 07/21/2019   MCV 96.0 07/21/2019   PLT 111 (L) 07/21/2019     STUDIES: No results found.  ASSESSMENT: Invasive bladder cancer  PLAN:    1.  Invasive bladder  cancer: Patient was diagnosed with a high-grade noninvasive bladder cancer on January 25, 2018.  He received 6 infusions of intravesicular gemcitabine at that time.  More recently cystoscopy revealed invasive disease.  Patient is not a candidate for cystectomy or systemic chemotherapy, but he may benefit from immunotherapy treatment using Tecentriq.  CT scan results from April 02, 2019 reviewed independently with no obvious metastatic disease outside the patient's bladder.  He was noted to have interval improvement of asymmetric bladder wall thickening.  Repeat cystoscopy on May 15, 2019 revealed a 2 mm area in the right lateral wall concerning for recurrence, the bladder was otherwise normal.  TURBT was not completed secondary to patient's ongoing dementia.  Urology indicates repeat cystoscopy in 6 months, the patient's wife indicated that she would prefer to have repeat cystoscopy in 3 months for continued monitoring.  Proceed with cycle 10 of treatment today.  Return to clinic in 3 weeks for further evaluation and consideration of cycle 11. 2.  Thrombocytopenia: Chronic and unchanged.  Patient's platelet count is 111 today. 3.  Elevated bilirubin: Total bilirubin has increased to 1.5, monitor.  Possibly secondary to Tecentriq. 4.  Chronic renal insufficiency: Creatinine slightly improved to 1.33. 5.  Rash: Patient does not complain of this today.  Continue OTC treatments as needed. 6.  IV access: Patient declined port placement, but will consider one in the future if IV access becomes an issue.   Patient expressed understanding and was in agreement with this plan. He also understands that He can call clinic at any time with any questions, concerns, or complaints.     Lloyd Huger, MD   07/21/2019 2:22 PM

## 2019-07-21 ENCOUNTER — Ambulatory Visit: Payer: Medicare Other | Admitting: Oncology

## 2019-07-21 ENCOUNTER — Encounter: Payer: Self-pay | Admitting: Oncology

## 2019-07-21 ENCOUNTER — Other Ambulatory Visit: Payer: Self-pay

## 2019-07-21 ENCOUNTER — Inpatient Hospital Stay: Payer: Medicare Other

## 2019-07-21 ENCOUNTER — Other Ambulatory Visit: Payer: Medicare Other

## 2019-07-21 ENCOUNTER — Ambulatory Visit: Payer: Medicare Other

## 2019-07-21 ENCOUNTER — Inpatient Hospital Stay (HOSPITAL_BASED_OUTPATIENT_CLINIC_OR_DEPARTMENT_OTHER): Payer: Medicare Other | Admitting: Oncology

## 2019-07-21 VITALS — BP 122/69 | HR 70 | Temp 97.6°F | Wt 205.7 lb

## 2019-07-21 DIAGNOSIS — C679 Malignant neoplasm of bladder, unspecified: Secondary | ICD-10-CM

## 2019-07-21 DIAGNOSIS — C689 Malignant neoplasm of urinary organ, unspecified: Secondary | ICD-10-CM

## 2019-07-21 DIAGNOSIS — Z5112 Encounter for antineoplastic immunotherapy: Secondary | ICD-10-CM | POA: Diagnosis not present

## 2019-07-21 LAB — COMPREHENSIVE METABOLIC PANEL
ALT: 23 U/L (ref 0–44)
AST: 25 U/L (ref 15–41)
Albumin: 3.9 g/dL (ref 3.5–5.0)
Alkaline Phosphatase: 71 U/L (ref 38–126)
Anion gap: 9 (ref 5–15)
BUN: 29 mg/dL — ABNORMAL HIGH (ref 8–23)
CO2: 25 mmol/L (ref 22–32)
Calcium: 9.1 mg/dL (ref 8.9–10.3)
Chloride: 102 mmol/L (ref 98–111)
Creatinine, Ser: 1.33 mg/dL — ABNORMAL HIGH (ref 0.61–1.24)
GFR calc Af Amer: 59 mL/min — ABNORMAL LOW (ref >60)
GFR calc non Af Amer: 51 mL/min — ABNORMAL LOW (ref >60)
Glucose, Bld: 96 mg/dL (ref 70–99)
Potassium: 4.7 mmol/L (ref 3.5–5.1)
Sodium: 136 mmol/L (ref 135–145)
Total Bilirubin: 1.5 mg/dL — ABNORMAL HIGH (ref 0.3–1.2)
Total Protein: 7.7 g/dL (ref 6.5–8.1)

## 2019-07-21 LAB — CBC WITH DIFFERENTIAL/PLATELET
Abs Immature Granulocytes: 0.03 10*3/uL (ref 0.00–0.07)
Basophils Absolute: 0 10*3/uL (ref 0.0–0.1)
Basophils Relative: 0 %
Eosinophils Absolute: 0.2 10*3/uL (ref 0.0–0.5)
Eosinophils Relative: 3 %
HCT: 47.6 % (ref 39.0–52.0)
Hemoglobin: 16.2 g/dL (ref 13.0–17.0)
Immature Granulocytes: 0 %
Lymphocytes Relative: 9 %
Lymphs Abs: 0.7 10*3/uL (ref 0.7–4.0)
MCH: 32.7 pg (ref 26.0–34.0)
MCHC: 34 g/dL (ref 30.0–36.0)
MCV: 96 fL (ref 80.0–100.0)
Monocytes Absolute: 0.7 10*3/uL (ref 0.1–1.0)
Monocytes Relative: 8 %
Neutro Abs: 6.5 10*3/uL (ref 1.7–7.7)
Neutrophils Relative %: 80 %
Platelets: 111 10*3/uL — ABNORMAL LOW (ref 150–400)
RBC: 4.96 MIL/uL (ref 4.22–5.81)
RDW: 13.3 % (ref 11.5–15.5)
WBC: 8.1 10*3/uL (ref 4.0–10.5)
nRBC: 0 % (ref 0.0–0.2)

## 2019-07-21 MED ORDER — SODIUM CHLORIDE 0.9 % IV SOLN
1200.0000 mg | Freq: Once | INTRAVENOUS | Status: AC
  Administered 2019-07-21: 1200 mg via INTRAVENOUS
  Filled 2019-07-21: qty 20

## 2019-07-21 MED ORDER — SODIUM CHLORIDE 0.9 % IV SOLN
Freq: Once | INTRAVENOUS | Status: AC
  Filled 2019-07-21: qty 250

## 2019-07-22 LAB — THYROID PANEL WITH TSH
Free Thyroxine Index: 1.9 (ref 1.2–4.9)
T3 Uptake Ratio: 30 % (ref 24–39)
T4, Total: 6.4 ug/dL (ref 4.5–12.0)
TSH: 1.55 u[IU]/mL (ref 0.450–4.500)

## 2019-07-28 ENCOUNTER — Ambulatory Visit: Payer: Medicare Other | Admitting: Podiatry

## 2019-07-31 ENCOUNTER — Ambulatory Visit (INDEPENDENT_AMBULATORY_CARE_PROVIDER_SITE_OTHER): Payer: Medicare Other | Admitting: Podiatry

## 2019-07-31 ENCOUNTER — Encounter: Payer: Self-pay | Admitting: Podiatry

## 2019-07-31 ENCOUNTER — Other Ambulatory Visit: Payer: Self-pay

## 2019-07-31 VITALS — Temp 96.6°F

## 2019-07-31 DIAGNOSIS — D689 Coagulation defect, unspecified: Secondary | ICD-10-CM

## 2019-07-31 DIAGNOSIS — B351 Tinea unguium: Secondary | ICD-10-CM

## 2019-07-31 DIAGNOSIS — M79675 Pain in left toe(s): Secondary | ICD-10-CM

## 2019-07-31 DIAGNOSIS — M79674 Pain in right toe(s): Secondary | ICD-10-CM

## 2019-07-31 NOTE — Progress Notes (Signed)
This patient returns to my office for at risk foot care.  This patient requires this care by a professional since this patient will be at risk due to having coagulation defect and lymphedema.  Patient is accompanied by his wife.  This patient is unable to cut nails himself since the patient cannot reach his nails.These nails are painful walking and wearing shoes.  This patient presents for at risk foot care today.  General Appearance  Alert, conversant and in no acute stress.  Vascular  Dorsalis pedis  are palpable  bilaterally. Posterior tibial pulses are absent  B/L. Capillary return is within normal limits  bilaterally. Cold feet   bilaterally.  Neurologic  Senn-Weinstein monofilament wire test diminished   bilaterally. Muscle power within normal limits bilaterally.  Nails Thick disfigured discolored nails with subungual debris  from hallux to fifth toes bilaterally. No evidence of bacterial infection or drainage bilaterally.  Orthopedic  No limitations of motion  feet .  No crepitus or effusions noted.  No bony pathology or digital deformities noted.  Skin  normotropic skin with no porokeratosis noted bilaterally.  No signs of infections or ulcers noted.     Onychomycosis  Pain in right toes  Pain in left toes  Consent was obtained for treatment procedures.   Mechanical debridement of nails 1-5  bilaterally performed with a nail nipper.  Filed with dremel without incident.    Return office visit    4 months                  Told patient to return for periodic foot care and evaluation due to potential at risk complications.   Isella Slatten DPM  

## 2019-08-04 NOTE — Progress Notes (Signed)
Pharmacist Chemotherapy Monitoring - Follow Up Assessment    I verify that I have reviewed each item in the below checklist:  . Regimen for the patient is scheduled for the appropriate day and plan matches scheduled date. Marland Kitchen Appropriate non-routine labs are ordered dependent on drug ordered. . If applicable, additional medications reviewed and ordered per protocol based on lifetime cumulative doses and/or treatment regimen.   Plan for follow-up and/or issues identified: No . I-vent associated with next due treatment: No . MD and/or nursing notified: No  Manuel Lawhead K 08/04/2019 11:45 AM

## 2019-08-08 NOTE — Progress Notes (Signed)
[place]  Telephone:(336) [phone] Fax:(336) [phone]  ID: Kyle Gregory OB: 12/05/1940  MR#: [id number]  [id number]  Patient Care Team: Rusty Aus, MD as PCP - General (Internal Medicine)  CHIEF COMPLAINT: Invasive bladder cancer  INTERVAL HISTORY: Patient returns to clinic today for further evaluation and consideration of his next infusion of Tecentriq.  He continues to feel well and remains asymptomatic.  He is a poor historian given his underlying dementia and is accompanied by his wife today.  He continues to tolerate his treatments well without significant side effects.  He has no neurologic complaints.  He denies any recent fevers or illnesses.  He has a good appetite and denies weight loss.  He denies any chest pain, shortness of breath, cough, or hemoptysis.  He denies any nausea, vomiting, constipation, or diarrhea.  He has no urinary complaints.  He denies hematuria.  Patient offers no specific complaints today.  REVIEW OF SYSTEMS:   Review of Systems  Constitutional: Negative.  Negative for fever, malaise/fatigue and weight loss.  Respiratory: Negative.  Negative for cough and shortness of breath.   Cardiovascular: Negative.  Negative for chest pain and leg swelling.  Gastrointestinal: Negative.  Negative for abdominal pain.  Genitourinary: Negative.  Negative for dysuria and hematuria.  Musculoskeletal: Negative.  Negative for back pain.  Skin: Negative.  Negative for rash.  Neurological: Negative.  Negative for focal weakness, weakness and headaches.  Psychiatric/Behavioral: Positive for memory loss. The patient is not nervous/anxious.     As per HPI. Otherwise, a complete review of systems is negative.  PAST MEDICAL HISTORY: Past Medical History:  Diagnosis Date  . Anemia   . Aortic stenosis   . Arrhythmia   . Atrial fibrillation ([place])   . Bladder cancer ([place])   . Broken skin    buttocks  . Cardiomyopathy ([place])   . CHF (congestive  heart failure) ([place])   . COPD (chronic obstructive pulmonary disease) ([place])   . Coronary artery disease   . Dementia ([place])   . Dysrhythmia   . Elevated lipids   . GERD (gastroesophageal reflux disease)   . Heartburn   . Lower extremity edema   . Presence of permanent cardiac pacemaker   . Stroke ([place])    x 2 after heart surgery  . VRE (vancomycin resistant enterococcus) culture positive     PAST SURGICAL HISTORY: Past Surgical History:  Procedure Laterality Date  . CARDIAC VALVE REPLACEMENT    . 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 CYSTOSCOPY W/ RETROGRADES Bilateral 08/14/2016   Procedure: CYSTOSCOPY WITH RETROGRADE PYELOGRAM;  Surgeon: Hollice Espy, MD;  Location: [facility];  Service: Urology;  Laterality: Bilateral;  . LEFT HEART CATH AND CORONARY ANGIOGRAPHY Left 09/05/2016   Procedure: Left Heart Cath and Coronary Angiography;  Surgeon: Teodoro Spray, MD;  Location: [place] [facility];  Service: Cardiovascular;  Laterality: Left;  . neck growth removal     1970's  . PACEMAKER INSERTION N/A 06/24/2018   Procedure: INSERTION PACEMAKER;  Surgeon: Isaias Cowman, MD;  Location: [facility];  Service: Cardiovascular;  Laterality: N/A;  . TEMPORARY PACEMAKER N/A 06/22/2018   Procedure: TEMPORARY PACEMAKER;  Surgeon: Yolonda Kida, MD;  Location: [place] [facility];  Service: Cardiovascular;  Laterality: N/A;  . TONSILLECTOMY    . TRANSURETHRAL RESECTION OF BLADDER TUMOR N/A 08/14/2016   Procedure: TRANSURETHRAL RESECTION OF BLADDER TUMOR (TURBT);  Surgeon: Hollice Espy, MD;  Location: [facility];  Service: Urology;  Laterality: N/A;  . TRANSURETHRAL RESECTION OF BLADDER TUMOR N/A 01/25/2018   Procedure: TRANSURETHRAL RESECTION OF BLADDER TUMOR (TURBT);  Surgeon: Billey Co, MD;  Location: [facility];  Service: Urology;  Laterality: N/A;  . TRANSURETHRAL RESECTION OF BLADDER TUMOR N/A 11/06/2018   Procedure: TRANSURETHRAL  RESECTION OF BLADDER TUMOR (TURBT);  Surgeon: Billey Co, MD;  Location: [facility];  Service: Urology;  Laterality: N/A;    FAMILY HISTORY: Family History  Problem Relation Age of Onset  . Heart failure Father   . Heart failure Brother   . Atrial fibrillation Sister   . Prostate cancer Neg Hx   . Kidney cancer Neg Hx   . Bladder Cancer Neg Hx     ADVANCED DIRECTIVES (Y/N):  N  HEALTH MAINTENANCE: Social History   Tobacco Use  . Smoking status: Never Smoker  . Smokeless tobacco: Never Used  Substance Use Topics  . Alcohol use: No  . Drug use: No     Colonoscopy:  PAP:  Bone density:  Lipid panel:  Allergies  Allergen Reactions  . Amiodarone Other (See Comments)    Dizziness  . Donepezil Palpitations    Severe bradycardia    Current Outpatient Medications  Medication Sig Dispense Refill  . apixaban (ELIQUIS) 5 MG TABS tablet Take 1 tablet (5 mg total) by mouth 2 (two) times daily. 60 tablet 0  . Calcium Carb-Cholecalciferol (OYSTER SHELL CALCIUM) 500-400 MG-UNIT TABS Take by mouth.    . ferrous sulfate 325 (65 FE) MG tablet Take 325 mg by mouth 2 (two) times daily with a meal.    . FLUoxetine (PROZAC) 20 MG capsule Take 20 mg by mouth daily.     . furosemide (LASIX) 20 MG tablet Take 20 mg by mouth daily as needed (swelling feet/ankles.).     Marland Kitchen Melatonin 3 MG TABS Take 3 mg by mouth at bedtime.     . nitroGLYCERIN (NITROSTAT) 0.4 MG SL tablet Place 0.4 mg under the tongue every 5 (five) minutes as needed for chest pain (Up to 3 doses).     Marland Kitchen omeprazole (PRILOSEC) 20 MG capsule Take 20 mg by mouth daily as needed (Heartburn or acid reflux).     . pravastatin (PRAVACHOL) 40 MG tablet Take 40 mg by mouth at bedtime.   11  . prochlorperazine (COMPAZINE) 10 MG tablet Take 1 tablet (10 mg total) by mouth every 6 (six) hours as needed (Nausea or vomiting). 60 tablet 2  . spironolactone (ALDACTONE) 25 MG tablet Take 25 mg by mouth daily.     Marland Kitchen zinc oxide (CVS ZINC  OXIDE) 20 % ointment Apply 1 application topically 2 (two) times daily as needed for irritation.      No current facility-administered medications for this visit.   Facility-Administered Medications Ordered in Other Visits  Medication Dose Route Frequency Provider Last Rate Last Admin  . 0.9 %  sodium chloride infusion   Intravenous Once Lloyd Huger, MD      . 0.9 %  sodium chloride infusion   Intravenous Once Grayland Ormond, Kathlene November, MD      . [facility]) 1,200 mg in sodium chloride 0.9 % 250 mL chemo infusion  1,200 mg Intravenous Once Lloyd Huger, MD        OBJECTIVE: Vitals:   08/11/19 0937  BP: 124/83  Pulse: 70  Resp: 18  Temp: (!) 94.7 F (34.8 C)  SpO2: 99%     Body  mass index is 28.63 kg/m.    ECOG FS:0 - Asymptomatic  General: Well-developed, well-nourished, no acute distress.  Sitting in wheelchair. Eyes: Pink conjunctiva, anicteric sclera. HEENT: Normocephalic, moist mucous membranes. Lungs: No audible wheezing or coughing. Heart: Regular rate and rhythm. Abdomen: Soft, nontender, no obvious distention. Musculoskeletal: No edema, cyanosis, or clubbing. Neuro: Alert, confused. Cranial nerves grossly intact. Skin: No rashes or petechiae noted. Psych: Normal affect.    LAB RESULTS:  Lab Results  Component Value Date   NA 139 08/11/2019   K 4.4 08/11/2019   CL 104 08/11/2019   CO2 26 08/11/2019   GLUCOSE 68 (L) 08/11/2019   BUN 30 (H) 08/11/2019   CREATININE 1.12 08/11/2019   CALCIUM 9.3 08/11/2019   PROT 7.8 08/11/2019   ALBUMIN 4.0 08/11/2019   AST 28 08/11/2019   ALT 26 08/11/2019   ALKPHOS 72 08/11/2019   BILITOT 1.3 (H) 08/11/2019   GFRNONAA >60 08/11/2019   GFRAA >60 08/11/2019    Lab Results  Component Value Date   WBC 6.9 08/11/2019   NEUTROABS 5.0 08/11/2019   HGB 16.2 08/11/2019   HCT 48.9 08/11/2019   MCV 95.5 08/11/2019   PLT 105 (L) 08/11/2019     STUDIES: No results found.  ASSESSMENT: Invasive  bladder cancer  PLAN:    1.  Invasive bladder cancer: Patient was diagnosed with a high-grade noninvasive bladder cancer on January 25, 2018.  He received 6 infusions of intravesicular gemcitabine at that time.  More recently cystoscopy revealed invasive disease.  Patient is not a candidate for cystectomy or systemic chemotherapy, but he may benefit from immunotherapy treatment using Tecentriq.  CT scan results from April 02, 2019 reviewed independently with no obvious metastatic disease outside the patient's bladder.  He was noted to have interval improvement of asymmetric bladder wall thickening.  Repeat cystoscopy on May 15, 2019 revealed a 2 mm area in the right lateral wall concerning for recurrence, the bladder was otherwise normal.  TURBT was not completed secondary to patient's ongoing dementia.  Patient has an appointment with urology for cystoscopy later this week.  Proceed with cycle 11 of Tecentriq today.  Will plan for cycle 12 in 3 weeks, but wife has requested if cystoscopy is negative and he has no evidence of disease if they could temporarily discontinue treatments.   2.  Thrombocytopenia: Chronic and unchanged.  Platelet count 105 today. 3.  Elevated bilirubin: Total bilirubin has trended down slightly to 1.3.  Monitor.  Possibly secondary to Tecentriq. 4.  Chronic renal insufficiency: Resolved. 5.  Rash: Patient does not complain of this today.  Continue OTC treatments as needed. 6.  IV access: Patient declined port placement, but will consider one in the future if IV access becomes an issue.   Patient expressed understanding and was in agreement with this plan. He also understands that He can call clinic at any time with any questions, concerns, or complaints.     Lloyd Huger, MD   08/11/2019 10:39 AM

## 2019-08-11 ENCOUNTER — Other Ambulatory Visit: Payer: Self-pay

## 2019-08-11 ENCOUNTER — Inpatient Hospital Stay: Payer: Medicare Other | Attending: Oncology

## 2019-08-11 ENCOUNTER — Inpatient Hospital Stay (HOSPITAL_BASED_OUTPATIENT_CLINIC_OR_DEPARTMENT_OTHER): Payer: Medicare Other | Admitting: Oncology

## 2019-08-11 ENCOUNTER — Encounter: Payer: Self-pay | Admitting: Oncology

## 2019-08-11 ENCOUNTER — Inpatient Hospital Stay: Payer: Medicare Other

## 2019-08-11 VITALS — BP 124/83 | HR 70 | Temp 94.7°F | Resp 18 | Wt 205.3 lb

## 2019-08-11 DIAGNOSIS — C679 Malignant neoplasm of bladder, unspecified: Secondary | ICD-10-CM

## 2019-08-11 DIAGNOSIS — Z79899 Other long term (current) drug therapy: Secondary | ICD-10-CM | POA: Diagnosis not present

## 2019-08-11 DIAGNOSIS — C689 Malignant neoplasm of urinary organ, unspecified: Secondary | ICD-10-CM

## 2019-08-11 DIAGNOSIS — Z5112 Encounter for antineoplastic immunotherapy: Secondary | ICD-10-CM | POA: Insufficient documentation

## 2019-08-11 LAB — COMPREHENSIVE METABOLIC PANEL
ALT: 26 U/L (ref 0–44)
AST: 28 U/L (ref 15–41)
Albumin: 4 g/dL (ref 3.5–5.0)
Alkaline Phosphatase: 72 U/L (ref 38–126)
Anion gap: 9 (ref 5–15)
BUN: 30 mg/dL — ABNORMAL HIGH (ref 8–23)
CO2: 26 mmol/L (ref 22–32)
Calcium: 9.3 mg/dL (ref 8.9–10.3)
Chloride: 104 mmol/L (ref 98–111)
Creatinine, Ser: 1.12 mg/dL (ref 0.61–1.24)
GFR calc Af Amer: >60 mL/min (ref >60)
GFR calc non Af Amer: >60 mL/min (ref >60)
Glucose, Bld: 68 mg/dL — ABNORMAL LOW (ref 70–99)
Potassium: 4.4 mmol/L (ref 3.5–5.1)
Sodium: 139 mmol/L (ref 135–145)
Total Bilirubin: 1.3 mg/dL — ABNORMAL HIGH (ref 0.3–1.2)
Total Protein: 7.8 g/dL (ref 6.5–8.1)

## 2019-08-11 LAB — CBC WITH DIFFERENTIAL/PLATELET
Abs Immature Granulocytes: 0.02 10*3/uL (ref 0.00–0.07)
Basophils Absolute: 0 10*3/uL (ref 0.0–0.1)
Basophils Relative: 1 %
Eosinophils Absolute: 0.3 10*3/uL (ref 0.0–0.5)
Eosinophils Relative: 4 %
HCT: 48.9 % (ref 39.0–52.0)
Hemoglobin: 16.2 g/dL (ref 13.0–17.0)
Immature Granulocytes: 0 %
Lymphocytes Relative: 14 %
Lymphs Abs: 1 10*3/uL (ref 0.7–4.0)
MCH: 31.6 pg (ref 26.0–34.0)
MCHC: 33.1 g/dL (ref 30.0–36.0)
MCV: 95.5 fL (ref 80.0–100.0)
Monocytes Absolute: 0.6 10*3/uL (ref 0.1–1.0)
Monocytes Relative: 9 %
Neutro Abs: 5 10*3/uL (ref 1.7–7.7)
Neutrophils Relative %: 72 %
Platelets: 105 10*3/uL — ABNORMAL LOW (ref 150–400)
RBC: 5.12 MIL/uL (ref 4.22–5.81)
RDW: 13.2 % (ref 11.5–15.5)
WBC: 6.9 10*3/uL (ref 4.0–10.5)
nRBC: 0 % (ref 0.0–0.2)

## 2019-08-11 MED ORDER — SODIUM CHLORIDE 0.9 % IV SOLN
1200.0000 mg | Freq: Once | INTRAVENOUS | Status: AC
  Administered 2019-08-11: 1200 mg via INTRAVENOUS
  Filled 2019-08-11: qty 20

## 2019-08-11 MED ORDER — SODIUM CHLORIDE 0.9 % IV SOLN
Freq: Once | INTRAVENOUS | Status: AC
  Filled 2019-08-11: qty 250

## 2019-08-12 LAB — THYROID PANEL WITH TSH
Free Thyroxine Index: 2 (ref 1.2–4.9)
T3 Uptake Ratio: 28 % (ref 24–39)
T4, Total: 7 ug/dL (ref 4.5–12.0)
TSH: 2.24 u[IU]/mL (ref 0.450–4.500)

## 2019-08-14 ENCOUNTER — Other Ambulatory Visit: Payer: Self-pay

## 2019-08-14 ENCOUNTER — Encounter: Payer: Self-pay | Admitting: Urology

## 2019-08-14 ENCOUNTER — Ambulatory Visit (INDEPENDENT_AMBULATORY_CARE_PROVIDER_SITE_OTHER): Payer: Medicare Other | Admitting: Urology

## 2019-08-14 VITALS — BP 93/64 | HR 78 | Ht 71.0 in | Wt 204.0 lb

## 2019-08-14 DIAGNOSIS — C679 Malignant neoplasm of bladder, unspecified: Secondary | ICD-10-CM

## 2019-08-14 LAB — URINALYSIS, COMPLETE
Bilirubin, UA: NEGATIVE
Glucose, UA: NEGATIVE
Ketones, UA: NEGATIVE
Leukocytes,UA: NEGATIVE
Nitrite, UA: NEGATIVE
Protein,UA: NEGATIVE
Specific Gravity, UA: 1.015 (ref 1.005–1.030)
Urobilinogen, Ur: 0.2 mg/dL (ref 0.2–1.0)
pH, UA: 6 (ref 5.0–7.5)

## 2019-08-14 LAB — MICROSCOPIC EXAMINATION

## 2019-08-14 MED ORDER — LIDOCAINE HCL URETHRAL/MUCOSAL 2 % EX GEL
1.0000 "application " | Freq: Once | CUTANEOUS | Status: AC
  Administered 2019-08-14: 1 via URETHRAL

## 2019-08-15 NOTE — Progress Notes (Signed)
Cysto and fulguration canceled today as patient had not held Eliquis.  Continue oncology intravesical treatment  Cysto and fulguration re-scheduled for ~6 weeks, needs to hold eliquis 3 days prior.  Nickolas Madrid, MD 08/15/2019

## 2019-08-25 NOTE — Progress Notes (Signed)
Pharmacist Chemotherapy Monitoring - Follow Up Assessment    I verify that I have reviewed each item in the below checklist:  . Regimen for the patient is scheduled for the appropriate day and plan matches scheduled date. Marland Kitchen Appropriate non-routine labs are ordered dependent on drug ordered. . If applicable, additional medications reviewed and ordered per protocol based on lifetime cumulative doses and/or treatment regimen.   Plan for follow-up and/or issues identified: No . I-vent associated with next due treatment: No . MD and/or nursing notified: No  Kyle Gregory K 08/25/2019 8:40 AM

## 2019-08-29 ENCOUNTER — Encounter: Payer: Self-pay | Admitting: Oncology

## 2019-08-30 NOTE — Progress Notes (Signed)
[facility]  Telephone:(336) [phone] Fax:(336) [phone]  ID: Kyle Gregory OB: 1941-03-25  MR#: [id number]  AOZ#:[id number]  Patient Care Team: Danella Penton, MD as PCP - General (Internal Medicine)  CHIEF COMPLAINT: Invasive bladder cancer  INTERVAL HISTORY: Patient returns to clinic today for further evaluation and continuation of Tecentriq.  He continues to feel well and remains asymptomatic.  Given his underlying dementia, the entire history is given by his wife.  He continues to tolerate his treatments well without significant side effects.  He has no neurologic complaints.  He denies any recent fevers or illnesses.  He has a good appetite and denies weight loss.  He denies any chest pain, shortness of breath, cough, or hemoptysis.  He denies any nausea, vomiting, constipation, or diarrhea.  He has no urinary complaints.  He denies hematuria.  Patient offers no specific complaints today.  REVIEW OF SYSTEMS:   Review of Systems  Constitutional: Negative.  Negative for fever, malaise/fatigue and weight loss.  Respiratory: Negative.  Negative for cough and shortness of breath.   Cardiovascular: Negative.  Negative for chest pain and leg swelling.  Gastrointestinal: Negative.  Negative for abdominal pain.  Genitourinary: Negative.  Negative for dysuria and hematuria.  Musculoskeletal: Negative.  Negative for back pain.  Skin: Negative.  Negative for rash.  Neurological: Negative.  Negative for focal weakness, weakness and headaches.  Psychiatric/Behavioral: Positive for memory loss. The patient is not nervous/anxious.     As per HPI. Otherwise, a complete review of systems is negative.  PAST MEDICAL HISTORY: Past Medical History:  Diagnosis Date  . Anemia   . Aortic stenosis   . Arrhythmia   . Atrial fibrillation (HCC)   . Bladder cancer (HCC)   . Broken skin    buttocks  . Cardiomyopathy (HCC)   . CHF (congestive heart failure) (HCC)   . COPD (chronic  obstructive pulmonary disease) (HCC)   . Coronary artery disease   . Dementia (HCC)   . Dysrhythmia   . Elevated lipids   . GERD (gastroesophageal reflux disease)   . Heartburn   . Lower extremity edema   . Presence of permanent cardiac pacemaker   . Stroke (HCC)    x 2 after heart surgery  . VRE (vancomycin resistant enterococcus) culture positive     PAST SURGICAL HISTORY: Past Surgical History:  Procedure Laterality Date  . CARDIAC VALVE REPLACEMENT    . 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 CYSTOSCOPY W/ RETROGRADES Bilateral 08/14/2016   Procedure: CYSTOSCOPY WITH RETROGRADE PYELOGRAM;  Surgeon: Vanna Scotland, MD;  Location: [facility] ORS;  Service: Urology;  Laterality: Bilateral;  . LEFT HEART CATH AND CORONARY ANGIOGRAPHY Left 09/05/2016   Procedure: Left Heart Cath and Coronary Angiography;  Surgeon: Dalia Heading, MD;  Location: [facility] INV[facility];  Service: Cardiovascular;  Laterality: Left;  . neck growth removal     1970's  . PACEMAKER INSERTION N/A 06/24/2018   Procedure: INSERTION PACEMAKER;  Surgeon: Marcina Millard, MD;  Location: [facility] OR[facility];  Service: Cardiovascular;  Laterality: N/A;  . TEMPORARY PACEMAKER N/A 06/22/2018   Procedure: TEMPORARY PACEMAKER;  Surgeon: Alwyn Pea, MD;  Location: [facility];  Service: Cardiovascular;  Laterality: N/A;  . TONSILLECTOMY    . TRANSURETHRAL RESECTION OF BLADDER TUMOR N/A 08/14/2016   Procedure: TRANSURETHRAL RESECTION OF BLADDER TUMOR (TURBT);  Surgeon: Vanna Scotland, MD;  Location: [facility];  Service: Urology;  Laterality:  N/A;  . TRANSURETHRAL RESECTION OF BLADDER TUMOR N/A 01/25/2018   Procedure: TRANSURETHRAL RESECTION OF BLADDER TUMOR (TURBT);  Surgeon: Sondra Come, MD;  Location: [facility];  Service: Urology;  Laterality: N/A;  . TRANSURETHRAL RESECTION OF BLADDER TUMOR N/A 11/06/2018   Procedure: TRANSURETHRAL RESECTION OF BLADDER TUMOR (TURBT);  Surgeon:  Sondra Come, MD;  Location: [facility];  Service: Urology;  Laterality: N/A;    FAMILY HISTORY: Family History  Problem Relation Age of Onset  . Heart failure Father   . Heart failure Brother   . Atrial fibrillation Sister   . Prostate cancer Neg Hx   . Kidney cancer Neg Hx   . Bladder Cancer Neg Hx     ADVANCED DIRECTIVES (Y/N):  N  HEALTH MAINTENANCE: Social History   Tobacco Use  . Smoking status: Never Smoker  . Smokeless tobacco: Never Used  Substance Use Topics  . Alcohol use: No  . Drug use: No     Colonoscopy:  PAP:  Bone density:  Lipid panel:  Allergies  Allergen Reactions  . Amiodarone Other (See Comments)    Dizziness  . Donepezil Palpitations    Severe bradycardia    Current Outpatient Medications  Medication Sig Dispense Refill  . Calcium Carb-Cholecalciferol (OYSTER SHELL CALCIUM) 500-400 MG-UNIT TABS Take by mouth.    . ferrous sulfate 325 (65 FE) MG tablet Take 325 mg by mouth 2 (two) times daily with a meal.    . FLUoxetine (PROZAC) 20 MG capsule Take 20 mg by mouth daily.     . furosemide (LASIX) 20 MG tablet Take 20 mg by mouth daily as needed (swelling feet/ankles.).     Marland Kitchen Melatonin 3 MG TABS Take 3 mg by mouth at bedtime.     . nitroGLYCERIN (NITROSTAT) 0.4 MG SL tablet Place 0.4 mg under the tongue every 5 (five) minutes as needed for chest pain (Up to 3 doses).     Marland Kitchen omeprazole (PRILOSEC) 20 MG capsule Take 20 mg by mouth daily as needed (Heartburn or acid reflux).     . pravastatin (PRAVACHOL) 40 MG tablet Take 40 mg by mouth at bedtime.   11  . spironolactone (ALDACTONE) 25 MG tablet Take 25 mg by mouth daily.     Marland Kitchen zinc oxide ([employer] ZINC OXIDE) 20 % ointment Apply 1 application topically 2 (two) times daily as needed for irritation.     Marland Kitchen apixaban (ELIQUIS) 5 MG TABS tablet Take 1 tablet (5 mg total) by mouth 2 (two) times daily. (Patient not taking: Reported on 08/29/2019) 60 tablet 0   No current facility-administered medications for  this visit.   Facility-Administered Medications Ordered in Other Visits  Medication Dose Route Frequency Provider Last Rate Last Admin  . 0.9 %  sodium chloride infusion   Intravenous Once Jeralyn Ruths, MD      . 0.9 %  sodium chloride infusion   Intravenous Once Orlie Dakin, Tollie Pizza, MD      . Lacretia Nicks [facility]) 1,200 mg in sodium chloride 0.9 % 250 mL chemo infusion  1,200 mg Intravenous Once Jeralyn Ruths, MD        OBJECTIVE: Vitals:   09/01/19 1427  BP: 108/69  Pulse: 71  Resp: 20  Temp: (!) 95.5 F (35.3 C)  SpO2: 97%     Body mass index is 28.76 kg/m.    ECOG FS:0 - Asymptomatic  General: Well-developed, well-nourished, no acute distress. Eyes: Pink conjunctiva, anicteric sclera. HEENT: Normocephalic, moist mucous membranes.  Lungs: No audible wheezing or coughing. Heart: Regular rate and rhythm. Abdomen: Soft, nontender, no obvious distention. Musculoskeletal: No edema, cyanosis, or clubbing. Neuro: Alert, answering all questions appropriately. Cranial nerves grossly intact. Skin: No rashes or petechiae noted. Psych: Normal affect.  LAB RESULTS:  Lab Results  Component Value Date   NA 136 09/01/2019   K 4.6 09/01/2019   CL 101 09/01/2019   CO2 26 09/01/2019   GLUCOSE 73 09/01/2019   BUN 28 (H) 09/01/2019   CREATININE 1.28 (H) 09/01/2019   CALCIUM 8.9 09/01/2019   PROT 7.9 09/01/2019   ALBUMIN 4.0 09/01/2019   AST 26 09/01/2019   ALT 23 09/01/2019   ALKPHOS 76 09/01/2019   BILITOT 1.5 (H) 09/01/2019   GFRNONAA 53 (L) 09/01/2019   GFRAA >60 09/01/2019    Lab Results  Component Value Date   WBC 7.3 09/01/2019   NEUTROABS 5.4 09/01/2019   HGB 15.9 09/01/2019   HCT 47.6 09/01/2019   MCV 96.0 09/01/2019   PLT 108 (L) 09/01/2019     STUDIES: No results found.  ASSESSMENT: Invasive bladder cancer  PLAN:    1.  Invasive bladder cancer: Patient was diagnosed with a high-grade noninvasive bladder cancer on January 25, 2018.  He  received 6 infusions of intravesicular gemcitabine at that time.  More recently cystoscopy revealed invasive disease.  Patient is not a candidate for cystectomy or systemic chemotherapy, but he may benefit from immunotherapy treatment using Tecentriq.  CT scan results from April 02, 2019 reviewed independently with no obvious metastatic disease outside the patient's bladder.  He was noted to have interval improvement of asymmetric bladder wall thickening.  Repeat cystoscopy on May 15, 2019 revealed a 2 mm area in the right lateral wall concerning for recurrence, the bladder was otherwise normal.  TURBT was not completed secondary to patient's ongoing dementia.  Patient's most recent cystoscopy was not performed secondary to him taking his Eliquis to close the procedure.  This has been rescheduled in the next several weeks.  Proceed with cycle 12 of Tecentriq today.  Return to clinic in 3 weeks for further evaluation and consideration of cycle 13.  If cystoscopy is negative, would consider temporarily discontinuing treatment.   2.  Thrombocytopenia: Chronic and unchanged.  Platelet count is 108 today. 3.  Elevated bilirubin: Chronic and unchanged.  Total bilirubin is 1.5 today.  Possibly secondary to Tecentriq. 4.  Chronic renal insufficiency: Patient's creatinine is trended up slightly to 1.28.  Monitor. 5.  Rash: Patient does not complain of this today.  Continue OTC treatments as needed. 6.  IV access: Patient declined port placement, but will consider one in the future if IV access becomes an issue.   Patient expressed understanding and was in agreement with this plan. He also understands that He can call clinic at any time with any questions, concerns, or complaints.     Jeralyn Ruths, MD   09/01/2019 2:49 PM

## 2019-09-01 ENCOUNTER — Inpatient Hospital Stay: Payer: Medicare Other

## 2019-09-01 ENCOUNTER — Other Ambulatory Visit: Payer: Self-pay

## 2019-09-01 ENCOUNTER — Inpatient Hospital Stay (HOSPITAL_BASED_OUTPATIENT_CLINIC_OR_DEPARTMENT_OTHER): Payer: Medicare Other | Admitting: Oncology

## 2019-09-01 ENCOUNTER — Inpatient Hospital Stay: Payer: Medicare Other | Attending: Oncology

## 2019-09-01 ENCOUNTER — Encounter: Payer: Self-pay | Admitting: Oncology

## 2019-09-01 VITALS — BP 108/69 | HR 71 | Temp 95.5°F | Resp 20 | Wt 206.2 lb

## 2019-09-01 DIAGNOSIS — C679 Malignant neoplasm of bladder, unspecified: Secondary | ICD-10-CM | POA: Diagnosis not present

## 2019-09-01 DIAGNOSIS — C689 Malignant neoplasm of urinary organ, unspecified: Secondary | ICD-10-CM

## 2019-09-01 DIAGNOSIS — Z5112 Encounter for antineoplastic immunotherapy: Secondary | ICD-10-CM | POA: Diagnosis not present

## 2019-09-01 DIAGNOSIS — Z79899 Other long term (current) drug therapy: Secondary | ICD-10-CM | POA: Insufficient documentation

## 2019-09-01 LAB — COMPREHENSIVE METABOLIC PANEL
ALT: 23 U/L (ref 0–44)
AST: 26 U/L (ref 15–41)
Albumin: 4 g/dL (ref 3.5–5.0)
Alkaline Phosphatase: 76 U/L (ref 38–126)
Anion gap: 9 (ref 5–15)
BUN: 28 mg/dL — ABNORMAL HIGH (ref 8–23)
CO2: 26 mmol/L (ref 22–32)
Calcium: 8.9 mg/dL (ref 8.9–10.3)
Chloride: 101 mmol/L (ref 98–111)
Creatinine, Ser: 1.28 mg/dL — ABNORMAL HIGH (ref 0.61–1.24)
GFR calc Af Amer: >60 mL/min (ref >60)
GFR calc non Af Amer: 53 mL/min — ABNORMAL LOW (ref >60)
Glucose, Bld: 73 mg/dL (ref 70–99)
Potassium: 4.6 mmol/L (ref 3.5–5.1)
Sodium: 136 mmol/L (ref 135–145)
Total Bilirubin: 1.5 mg/dL — ABNORMAL HIGH (ref 0.3–1.2)
Total Protein: 7.9 g/dL (ref 6.5–8.1)

## 2019-09-01 LAB — CBC WITH DIFFERENTIAL/PLATELET
Abs Immature Granulocytes: 0.02 10*3/uL (ref 0.00–0.07)
Basophils Absolute: 0 10*3/uL (ref 0.0–0.1)
Basophils Relative: 1 %
Eosinophils Absolute: 0.2 10*3/uL (ref 0.0–0.5)
Eosinophils Relative: 3 %
HCT: 47.6 % (ref 39.0–52.0)
Hemoglobin: 15.9 g/dL (ref 13.0–17.0)
Immature Granulocytes: 0 %
Lymphocytes Relative: 12 %
Lymphs Abs: 0.9 10*3/uL (ref 0.7–4.0)
MCH: 32.1 pg (ref 26.0–34.0)
MCHC: 33.4 g/dL (ref 30.0–36.0)
MCV: 96 fL (ref 80.0–100.0)
Monocytes Absolute: 0.7 10*3/uL (ref 0.1–1.0)
Monocytes Relative: 9 %
Neutro Abs: 5.4 10*3/uL (ref 1.7–7.7)
Neutrophils Relative %: 75 %
Platelets: 108 10*3/uL — ABNORMAL LOW (ref 150–400)
RBC: 4.96 MIL/uL (ref 4.22–5.81)
RDW: 13.2 % (ref 11.5–15.5)
WBC: 7.3 10*3/uL (ref 4.0–10.5)
nRBC: 0 % (ref 0.0–0.2)

## 2019-09-01 MED ORDER — SODIUM CHLORIDE 0.9 % IV SOLN
Freq: Once | INTRAVENOUS | Status: AC
  Filled 2019-09-01: qty 250

## 2019-09-01 MED ORDER — SODIUM CHLORIDE 0.9 % IV SOLN
1200.0000 mg | Freq: Once | INTRAVENOUS | Status: AC
  Administered 2019-09-01: 1200 mg via INTRAVENOUS
  Filled 2019-09-01: qty 20

## 2019-09-01 NOTE — Progress Notes (Signed)
Patient here with wife for follow up. Reports no concerns and denies any pain. Wife states patient fell once last week but just skinned his elbow and is doing good.

## 2019-09-02 DIAGNOSIS — D696 Thrombocytopenia, unspecified: Secondary | ICD-10-CM | POA: Insufficient documentation

## 2019-09-02 LAB — THYROID PANEL WITH TSH
Free Thyroxine Index: 2.1 (ref 1.2–4.9)
T3 Uptake Ratio: 29 % (ref 24–39)
T4, Total: 7.2 ug/dL (ref 4.5–12.0)
TSH: 1.95 u[IU]/mL (ref 0.450–4.500)

## 2019-09-15 NOTE — Progress Notes (Signed)
Pharmacist Chemotherapy Monitoring - Follow Up Assessment    I verify that I have reviewed each item in the below checklist:  . Regimen for the patient is scheduled for the appropriate day and plan matches scheduled date. Marland Kitchen Appropriate non-routine labs are ordered dependent on drug ordered. . If applicable, additional medications reviewed and ordered per protocol based on lifetime cumulative doses and/or treatment regimen.   Plan for follow-up and/or issues identified: No . I-vent associated with next due treatment: No . MD and/or nursing notified: No  Kyle Gregory K 09/15/2019 8:21 AM

## 2019-09-20 NOTE — Progress Notes (Signed)
[place]  Telephone:(336) [phone] Fax:(336) [phone]  ID: KYCE STEBER OB: 11/20/1940  MR#: [id number]  [id number]  Patient Care Team: Rusty Aus, MD as PCP - General (Internal Medicine)  CHIEF COMPLAINT: Invasive bladder cancer  INTERVAL HISTORY: Patient returns to clinic today for further evaluation and continuation of Tecentriq.  His recent cystoscopy was postponed secondary to patient not discontinuing Eliquis.  He continues to feel well and remains asymptomatic. Given his underlying dementia, the entire history is given by his wife.  He continues to tolerate his treatments well without significant side effects.  He has no neurologic complaints. He denies any recent fevers or illnesses.  He has a good appetite and denies weight loss.  He denies any chest pain, shortness of breath, cough, or hemoptysis.  He denies any nausea, vomiting, constipation, or diarrhea.  He has no urinary complaints.  He denies hematuria.  Patient offers no specific complaints today.  REVIEW OF SYSTEMS:   Review of Systems  Constitutional: Negative.  Negative for fever, malaise/fatigue and weight loss.  Respiratory: Negative.  Negative for cough and shortness of breath.   Cardiovascular: Negative.  Negative for chest pain and leg swelling.  Gastrointestinal: Negative.  Negative for abdominal pain.  Genitourinary: Negative.  Negative for dysuria and hematuria.  Musculoskeletal: Negative.  Negative for back pain.  Skin: Negative.  Negative for rash.  Neurological: Negative.  Negative for focal weakness, weakness and headaches.  Psychiatric/Behavioral: Positive for memory loss. The patient is not nervous/anxious.     As per HPI. Otherwise, a complete review of systems is negative.  PAST MEDICAL HISTORY: Past Medical History:  Diagnosis Date  . Anemia   . Aortic stenosis   . Arrhythmia   . Atrial fibrillation ([place])   . Bladder cancer ([place])   . Broken skin    buttocks  .  Cardiomyopathy ([place])   . CHF (congestive heart failure) ([place])   . COPD (chronic obstructive pulmonary disease) ([place])   . Coronary artery disease   . Dementia ([place])   . Dysrhythmia   . Elevated lipids   . GERD (gastroesophageal reflux disease)   . Heartburn   . Lower extremity edema   . Presence of permanent cardiac pacemaker   . Stroke ([place])    x 2 after heart surgery  . VRE (vancomycin resistant enterococcus) culture positive     PAST SURGICAL HISTORY: Past Surgical History:  Procedure Laterality Date  . CARDIAC VALVE REPLACEMENT    . 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 CYSTOSCOPY W/ RETROGRADES Bilateral 08/14/2016   Procedure: CYSTOSCOPY WITH RETROGRADE PYELOGRAM;  Surgeon: Hollice Espy, MD;  Location: [facility];  Service: Urology;  Laterality: Bilateral;  . LEFT HEART CATH AND CORONARY ANGIOGRAPHY Left 09/05/2016   Procedure: Left Heart Cath and Coronary Angiography;  Surgeon: Teodoro Spray, MD;  Location: [place] [facility];  Service: Cardiovascular;  Laterality: Left;  . neck growth removal     1970's  . PACEMAKER INSERTION N/A 06/24/2018   Procedure: INSERTION PACEMAKER;  Surgeon: Isaias Cowman, MD;  Location: [facility];  Service: Cardiovascular;  Laterality: N/A;  . TEMPORARY PACEMAKER N/A 06/22/2018   Procedure: TEMPORARY PACEMAKER;  Surgeon: Yolonda Kida, MD;  Location: [place] [facility];  Service: Cardiovascular;  Laterality: N/A;  . TONSILLECTOMY    . TRANSURETHRAL RESECTION OF BLADDER TUMOR N/A 08/14/2016   Procedure: TRANSURETHRAL RESECTION OF BLADDER TUMOR (TURBT);  Surgeon: Hollice Espy,  MD;  Location: [facility];  Service: Urology;  Laterality: N/A;  . TRANSURETHRAL RESECTION OF BLADDER TUMOR N/A 01/25/2018   Procedure: TRANSURETHRAL RESECTION OF BLADDER TUMOR (TURBT);  Surgeon: Billey Co, MD;  Location: [facility];  Service: Urology;  Laterality: N/A;  . TRANSURETHRAL RESECTION OF BLADDER TUMOR N/A  11/06/2018   Procedure: TRANSURETHRAL RESECTION OF BLADDER TUMOR (TURBT);  Surgeon: Billey Co, MD;  Location: [facility];  Service: Urology;  Laterality: N/A;    FAMILY HISTORY: Family History  Problem Relation Age of Onset  . Heart failure Father   . Heart failure Brother   . Atrial fibrillation Sister   . Prostate cancer Neg Hx   . Kidney cancer Neg Hx   . Bladder Cancer Neg Hx     ADVANCED DIRECTIVES (Y/N):  N  HEALTH MAINTENANCE: Social History   Tobacco Use  . Smoking status: Never Smoker  . Smokeless tobacco: Never Used  Substance Use Topics  . Alcohol use: No  . Drug use: No     Colonoscopy:  PAP:  Bone density:  Lipid panel:  Allergies  Allergen Reactions  . Amiodarone Other (See Comments)    Dizziness  . Donepezil Palpitations    Severe bradycardia    Current Outpatient Medications  Medication Sig Dispense Refill  . Calcium Carb-Cholecalciferol (OYSTER SHELL CALCIUM) 500-400 MG-UNIT TABS Take by mouth.    . ferrous sulfate 325 (65 FE) MG tablet Take 325 mg by mouth 2 (two) times daily with a meal.    . FLUoxetine (PROZAC) 20 MG capsule Take 20 mg by mouth daily.     . furosemide (LASIX) 20 MG tablet Take 20 mg by mouth daily as needed (swelling feet/ankles.).     Marland Kitchen Melatonin 3 MG TABS Take 3 mg by mouth at bedtime.     . nitroGLYCERIN (NITROSTAT) 0.4 MG SL tablet Place 0.4 mg under the tongue every 5 (five) minutes as needed for chest pain (Up to 3 doses).     Marland Kitchen omeprazole (PRILOSEC) 20 MG capsule Take 20 mg by mouth daily as needed (Heartburn or acid reflux).     . pravastatin (PRAVACHOL) 40 MG tablet Take 40 mg by mouth at bedtime.   11  . spironolactone (ALDACTONE) 25 MG tablet Take 25 mg by mouth daily.     Marland Kitchen zinc oxide ([employer] ZINC OXIDE) 20 % ointment Apply 1 application topically 2 (two) times daily as needed for irritation.     Marland Kitchen apixaban (ELIQUIS) 5 MG TABS tablet Take 1 tablet (5 mg total) by mouth 2 (two) times daily. (Patient not taking:  Reported on 08/29/2019) 60 tablet 0   No current facility-administered medications for this visit.   Facility-Administered Medications Ordered in Other Visits  Medication Dose Route Frequency Provider Last Rate Last Admin  . 0.9 %  sodium chloride infusion   Intravenous Once Lloyd Huger, MD        OBJECTIVE: Vitals:   09/22/19 1358  BP: (!) 116/96  Pulse: 71  Resp: 20  Temp: (!) 96.5 F (35.8 C)  SpO2: 100%     Body mass index is 28.63 kg/m.    ECOG FS:0 - Asymptomatic  General: Well-developed, well-nourished, no acute distress. Eyes: Pink conjunctiva, anicteric sclera. HEENT: Normocephalic, moist mucous membranes. Lungs: No audible wheezing or coughing. Heart: Regular rate and rhythm. Abdomen: Soft, nontender, no obvious distention. Musculoskeletal: No edema, cyanosis, or clubbing. Neuro: Alert, answering all questions appropriately. Cranial nerves grossly intact. Skin: No rashes or  petechiae noted. Psych: Normal affect.  LAB RESULTS:  Lab Results  Component Value Date   NA 136 09/22/2019   K 4.6 09/22/2019   CL 102 09/22/2019   CO2 26 09/22/2019   GLUCOSE 85 09/22/2019   BUN 29 (H) 09/22/2019   CREATININE 1.43 (H) 09/22/2019   CALCIUM 9.0 09/22/2019   PROT 7.8 09/22/2019   ALBUMIN 4.0 09/22/2019   AST 27 09/22/2019   ALT 23 09/22/2019   ALKPHOS 74 09/22/2019   BILITOT 1.4 (H) 09/22/2019   GFRNONAA 46 (L) 09/22/2019   GFRAA 54 (L) 09/22/2019    Lab Results  Component Value Date   WBC 6.7 09/22/2019   NEUTROABS 5.0 09/22/2019   HGB 16.1 09/22/2019   HCT 47.2 09/22/2019   MCV 94.8 09/22/2019   PLT 102 (L) 09/22/2019     STUDIES: No results found.  ASSESSMENT: Invasive bladder cancer  PLAN:    1.  Invasive bladder cancer: Patient was diagnosed with a high-grade noninvasive bladder cancer on January 25, 2018.  He received 6 infusions of intravesicular gemcitabine at that time.  More recently cystoscopy revealed invasive disease.  Patient  is not a candidate for cystectomy or systemic chemotherapy, but he may benefit from immunotherapy treatment using Tecentriq.  CT scan results from April 02, 2019 reviewed independently with no obvious metastatic disease outside the patient's bladder.  He was noted to have interval improvement of asymmetric bladder wall thickening.  Repeat cystoscopy on May 15, 2019 revealed a 2 mm area in the right lateral wall concerning for recurrence, the bladder was otherwise normal.  TURBT was not completed secondary to patient's ongoing dementia.  Patient's most recent cystoscopy was not performed secondary to him taking his Eliquis to close the procedure.  He is now holding Eliquis and cystoscopy is scheduled for Sep 24, 2019.  Proceed with cycle 13 of Tecentriq today.  Return to clinic in 3 weeks for further evaluation and consideration of cycle 14.  If cystoscopy is negative, would consider temporarily discontinuing treatment.   2.  Thrombocytopenia: Chronic and unchanged.  Platelet count is 102 today. 3.  Elevated bilirubin: Chronic and unchanged.  Total bilirubin is 1.4 today.  Possibly secondary to Tecentriq. 4.  Chronic renal insufficiency: Creatinine has trended up slightly to 1.43.  Monitor. 5.  Rash: Patient does not complain of this today.  Continue OTC treatments as needed. 6.  IV access: Patient declined port placement, but will consider one in the future if IV access becomes an issue.   Patient expressed understanding and was in agreement with this plan. He also understands that He can call clinic at any time with any questions, concerns, or complaints.     Lloyd Huger, MD   09/23/2019 6:21 AM

## 2019-09-22 ENCOUNTER — Other Ambulatory Visit: Payer: Self-pay

## 2019-09-22 ENCOUNTER — Encounter: Payer: Self-pay | Admitting: Oncology

## 2019-09-22 ENCOUNTER — Inpatient Hospital Stay: Payer: Medicare Other

## 2019-09-22 ENCOUNTER — Inpatient Hospital Stay (HOSPITAL_BASED_OUTPATIENT_CLINIC_OR_DEPARTMENT_OTHER): Payer: Medicare Other | Admitting: Oncology

## 2019-09-22 VITALS — BP 122/79 | HR 71

## 2019-09-22 VITALS — BP 116/96 | HR 71 | Temp 96.5°F | Resp 20 | Wt 205.3 lb

## 2019-09-22 DIAGNOSIS — C679 Malignant neoplasm of bladder, unspecified: Secondary | ICD-10-CM

## 2019-09-22 DIAGNOSIS — C689 Malignant neoplasm of urinary organ, unspecified: Secondary | ICD-10-CM

## 2019-09-22 DIAGNOSIS — Z5112 Encounter for antineoplastic immunotherapy: Secondary | ICD-10-CM | POA: Diagnosis not present

## 2019-09-22 LAB — CBC WITH DIFFERENTIAL/PLATELET
Abs Immature Granulocytes: 0.02 10*3/uL (ref 0.00–0.07)
Basophils Absolute: 0 10*3/uL (ref 0.0–0.1)
Basophils Relative: 1 %
Eosinophils Absolute: 0.2 10*3/uL (ref 0.0–0.5)
Eosinophils Relative: 3 %
HCT: 47.2 % (ref 39.0–52.0)
Hemoglobin: 16.1 g/dL (ref 13.0–17.0)
Immature Granulocytes: 0 %
Lymphocytes Relative: 13 %
Lymphs Abs: 0.9 10*3/uL (ref 0.7–4.0)
MCH: 32.3 pg (ref 26.0–34.0)
MCHC: 34.1 g/dL (ref 30.0–36.0)
MCV: 94.8 fL (ref 80.0–100.0)
Monocytes Absolute: 0.6 10*3/uL (ref 0.1–1.0)
Monocytes Relative: 8 %
Neutro Abs: 5 10*3/uL (ref 1.7–7.7)
Neutrophils Relative %: 75 %
Platelets: 102 10*3/uL — ABNORMAL LOW (ref 150–400)
RBC: 4.98 MIL/uL (ref 4.22–5.81)
RDW: 13.3 % (ref 11.5–15.5)
WBC: 6.7 10*3/uL (ref 4.0–10.5)
nRBC: 0 % (ref 0.0–0.2)

## 2019-09-22 LAB — COMPREHENSIVE METABOLIC PANEL
ALT: 23 U/L (ref 0–44)
AST: 27 U/L (ref 15–41)
Albumin: 4 g/dL (ref 3.5–5.0)
Alkaline Phosphatase: 74 U/L (ref 38–126)
Anion gap: 8 (ref 5–15)
BUN: 29 mg/dL — ABNORMAL HIGH (ref 8–23)
CO2: 26 mmol/L (ref 22–32)
Calcium: 9 mg/dL (ref 8.9–10.3)
Chloride: 102 mmol/L (ref 98–111)
Creatinine, Ser: 1.43 mg/dL — ABNORMAL HIGH (ref 0.61–1.24)
GFR calc Af Amer: 54 mL/min — ABNORMAL LOW (ref >60)
GFR calc non Af Amer: 46 mL/min — ABNORMAL LOW (ref >60)
Glucose, Bld: 85 mg/dL (ref 70–99)
Potassium: 4.6 mmol/L (ref 3.5–5.1)
Sodium: 136 mmol/L (ref 135–145)
Total Bilirubin: 1.4 mg/dL — ABNORMAL HIGH (ref 0.3–1.2)
Total Protein: 7.8 g/dL (ref 6.5–8.1)

## 2019-09-22 MED ORDER — SODIUM CHLORIDE 0.9 % IV SOLN
Freq: Once | INTRAVENOUS | Status: AC
  Filled 2019-09-22: qty 250

## 2019-09-22 MED ORDER — SODIUM CHLORIDE 0.9 % IV SOLN
1200.0000 mg | Freq: Once | INTRAVENOUS | Status: AC
  Administered 2019-09-22: 1200 mg via INTRAVENOUS
  Filled 2019-09-22: qty 20

## 2019-09-22 NOTE — Progress Notes (Signed)
Patient states has concerns about a small tear on buttocks area.

## 2019-09-23 LAB — THYROID PANEL WITH TSH
Free Thyroxine Index: 2.1 (ref 1.2–4.9)
T3 Uptake Ratio: 29 % (ref 24–39)
T4, Total: 7.1 ug/dL (ref 4.5–12.0)
TSH: 2.13 u[IU]/mL (ref 0.450–4.500)

## 2019-09-24 ENCOUNTER — Other Ambulatory Visit: Payer: Self-pay | Admitting: Urology

## 2019-09-24 ENCOUNTER — Encounter: Payer: Self-pay | Admitting: Urology

## 2019-09-24 ENCOUNTER — Ambulatory Visit (INDEPENDENT_AMBULATORY_CARE_PROVIDER_SITE_OTHER): Payer: Medicare Other | Admitting: Urology

## 2019-09-24 ENCOUNTER — Other Ambulatory Visit: Payer: Self-pay

## 2019-09-24 VITALS — BP 132/79 | HR 70 | Ht 72.0 in

## 2019-09-24 DIAGNOSIS — C679 Malignant neoplasm of bladder, unspecified: Secondary | ICD-10-CM | POA: Diagnosis not present

## 2019-09-24 LAB — URINALYSIS, COMPLETE
Bilirubin, UA: NEGATIVE
Glucose, UA: NEGATIVE
Leukocytes,UA: NEGATIVE
Nitrite, UA: NEGATIVE
Protein,UA: NEGATIVE
Specific Gravity, UA: 1.025 (ref 1.005–1.030)
Urobilinogen, Ur: 1 mg/dL (ref 0.2–1.0)
pH, UA: 5.5 (ref 5.0–7.5)

## 2019-09-24 LAB — MICROSCOPIC EXAMINATION

## 2019-09-24 NOTE — Progress Notes (Signed)
Cystoscopy Procedure Note:  Indication:Hx of HG Ta bladder cancer  Prior treatments: -07/2016: TURBT 3cm HG Ta and CIS -BCG induction 3/6 treatments Lost to follow up  -12/2017 TURBT 2cm HG Ta (only 10% of tumor HG, primarily LG), no invasion -Induction gemcitabine 03/2018 (BCG shortage)  -10/2018: TURBT small ~1cm lesions x2, HG Ta -IV Atezolizumab with Medical Oncology x7 cycles for BCG refractor HG NMIBC  He was previously found to have some small papillary lesions consistent with likely recurrence is in the bladder, and since his dementia is significantly worsened and he has difficulty with anesthesia and worsening dementia, they opted for fulguration in clinic.  After informed consent and discussion of the procedure and its risks,Revin E Sniderwas positioned and prepped in the standard fashion. Cystoscopy was performed with a flexible cystoscope. The urethra, bladder neck and entire bladder was visualized in a standard fashion. The prostate was moderate in size.There was a very small papillary recurrence ~11mm in size at the right lateral wall, this was fulgurated with excellent hemostasis.  Findings: Small 22mm papillary recurrence at right lateral wall fulgurated  Imaging: CT imaging 04/02/2019 with contrast showed no evidence of metastatic disease or bladder wall thickening.  Assessment and Plan: RTC 6 months for cysto, he tolerated clinic fulguration very well and this is a good option moving forward if he has recurrences in order to avoid OR with his co-morbidities/dementia/trouble with anesthesia.  If tolerating ATEZOLIZUMAB well ok to continue maintenance, however with his co-morbidities would not be unreasonable to stop and just continue q6 months cystoscopic surveillance in clinic with fulguration as needed. He does look even more frail to me than prior visits.  RTC 6 months cysto  Nickolas Madrid, MD 09/24/2019

## 2019-10-06 NOTE — Progress Notes (Signed)
Pharmacist Chemotherapy Monitoring - Follow Up Assessment    I verify that I have reviewed each item in the below checklist:  . Regimen for the patient is scheduled for the appropriate day and plan matches scheduled date. Marland Kitchen Appropriate non-routine labs are ordered dependent on drug ordered. . If applicable, additional medications reviewed and ordered per protocol based on lifetime cumulative doses and/or treatment regimen.   Plan for follow-up and/or issues identified: No . I-vent associated with next due treatment: No . MD and/or nursing notified: No  Hinata Diener K 10/06/2019 8:31 AM

## 2019-10-11 NOTE — Progress Notes (Signed)
[place]  [phone] [phone]  ID: Kyle Gregory OB: Aug 20, 1940  MR#: [id number]  QZE#:[id number]  Patient Care Team: Rusty Aus, MD as PCP - General (Internal Medicine)  CHIEF COMPLAINT: Invasive bladder cancer  INTERVAL HISTORY: Patient returns to clinic today for further evaluation, discussion of his cystoscopy results and continuation of Tecentriq. Given his underlying dementia, the entire history is given by his wife. He continues to tolerate his treatments well without significant side effects.  He has no neurologic complaints. He denies any recent fevers or illnesses.  He has a good appetite and denies weight loss.  He denies any chest pain, shortness of breath, cough, or hemoptysis.  He denies any nausea, vomiting, constipation, or diarrhea.  He has no urinary complaints.  He denies hematuria.  Patient offers no specific complaints today.  REVIEW OF SYSTEMS:   Review of Systems  Constitutional: Negative.  Negative for fever, malaise/fatigue and weight loss.  Respiratory: Negative.  Negative for cough and shortness of breath.   Cardiovascular: Negative.  Negative for chest pain and leg swelling.  Gastrointestinal: Negative.  Negative for abdominal pain.  Genitourinary: Negative.  Negative for dysuria and hematuria.  Musculoskeletal: Negative.  Negative for back pain.  Skin: Negative.  Negative for rash.  Neurological: Negative.  Negative for focal weakness, weakness and headaches.  Psychiatric/Behavioral: Positive for memory loss. The patient is not nervous/anxious.     As per HPI. Otherwise, a complete review of systems is negative.  PAST MEDICAL HISTORY: Past Medical History:  Diagnosis Date  . Anemia   . Aortic stenosis   . Arrhythmia   . Atrial fibrillation ([place])   . Bladder cancer ([place])   . Broken skin    buttocks  . Cardiomyopathy ([place])   . CHF (congestive heart failure) ([place])   . COPD (chronic obstructive  pulmonary disease) ([place])   . Coronary artery disease   . Dementia ([place])   . Dysrhythmia   . Elevated lipids   . GERD (gastroesophageal reflux disease)   . Heartburn   . Lower extremity edema   . Presence of permanent cardiac pacemaker   . Stroke ([place])    x 2 after heart surgery  . VRE (vancomycin resistant enterococcus) culture positive     PAST SURGICAL HISTORY: Past Surgical History:  Procedure Laterality Date  . CARDIAC VALVE REPLACEMENT    . 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 CYSTOSCOPY W/ RETROGRADES Bilateral 08/14/2016   Procedure: CYSTOSCOPY WITH RETROGRADE PYELOGRAM;  Surgeon: Hollice Espy, MD;  Location: [facility];  Service: Urology;  Laterality: Bilateral;  . LEFT HEART CATH AND CORONARY ANGIOGRAPHY Left 09/05/2016   Procedure: Left Heart Cath and Coronary Angiography;  Surgeon: Teodoro Spray, MD;  Location: [facility];  Service: Cardiovascular;  Laterality: Left;  . neck growth removal     1970's  . PACEMAKER INSERTION N/A 06/24/2018   Procedure: INSERTION PACEMAKER;  Surgeon: Isaias Cowman, MD;  Location: [facility];  Service: Cardiovascular;  Laterality: N/A;  . TEMPORARY PACEMAKER N/A 06/22/2018   Procedure: TEMPORARY PACEMAKER;  Surgeon: Yolonda Kida, MD;  Location: [facility];  Service: Cardiovascular;  Laterality: N/A;  . TONSILLECTOMY    . TRANSURETHRAL RESECTION OF BLADDER TUMOR N/A 08/14/2016   Procedure: TRANSURETHRAL RESECTION OF BLADDER TUMOR (TURBT);  Surgeon: Hollice Espy, MD;  Location: [facility];  Service: Urology;  Laterality: N/A;  . TRANSURETHRAL RESECTION OF BLADDER  TUMOR N/A 01/25/2018   Procedure: TRANSURETHRAL RESECTION OF BLADDER TUMOR (TURBT);  Surgeon: Billey Co, MD;  Location: [facility];  Service: Urology;  Laterality: N/A;  . TRANSURETHRAL RESECTION OF BLADDER TUMOR N/A 11/06/2018   Procedure: TRANSURETHRAL RESECTION OF BLADDER TUMOR (TURBT);  Surgeon: Billey Co, MD;  Location: [facility];  Service: Urology;  Laterality: N/A;    FAMILY HISTORY: Family History  Problem Relation Age of Onset  . Heart failure Father   . Heart failure Brother   . Atrial fibrillation Sister   . Prostate cancer Neg Hx   . Kidney cancer Neg Hx   . Bladder Cancer Neg Hx     ADVANCED DIRECTIVES (Y/N):  N  HEALTH MAINTENANCE: Social History   Tobacco Use  . Smoking status: Never Smoker  . Smokeless tobacco: Never Used  Substance Use Topics  . Alcohol use: No  . Drug use: No     Colonoscopy:  PAP:  Bone density:  Lipid panel:  Allergies  Allergen Reactions  . Amiodarone Other (See Comments)    Dizziness  . Donepezil Palpitations    Severe bradycardia    Current Outpatient Medications  Medication Sig Dispense Refill  . apixaban (ELIQUIS) 5 MG TABS tablet Take 1 tablet (5 mg total) by mouth 2 (two) times daily. 60 tablet 0  . Calcium Carb-Cholecalciferol (OYSTER SHELL CALCIUM) 500-400 MG-UNIT TABS Take by mouth.    . cyanocobalamin (,VITAMIN B-12,) 1000 MCG/ML injection Inject into the muscle.    . ferrous sulfate 325 (65 FE) MG tablet Take 325 mg by mouth 2 (two) times daily with a meal.    . FLUoxetine (PROZAC) 20 MG capsule Take 20 mg by mouth daily.     . Melatonin 3 MG TABS Take 3 mg by mouth at bedtime.     . mupirocin ointment (BACTROBAN) 2 %     . nitroGLYCERIN (NITROSTAT) 0.4 MG SL tablet Place 0.4 mg under the tongue every 5 (five) minutes as needed for chest pain (Up to 3 doses).     Marland Kitchen omeprazole (PRILOSEC) 20 MG capsule Take 20 mg by mouth daily as needed (Heartburn or acid reflux).     . pravastatin (PRAVACHOL) 40 MG tablet Take 40 mg by mouth at bedtime.   11  . spironolactone (ALDACTONE) 25 MG tablet Take 25 mg by mouth daily.     Marland Kitchen zinc oxide ([employer] ZINC OXIDE) 20 % ointment Apply 1 application topically 2 (two) times daily as needed for irritation.      No current facility-administered medications for this visit.    Facility-Administered Medications Ordered in Other Visits  Medication Dose Route Frequency Provider Last Rate Last Admin  . 0.9 %  sodium chloride infusion   Intravenous Once Lloyd Huger, MD        OBJECTIVE: Vitals:   10/13/19 1415  BP: 116/70  Pulse: 72  Resp: 20  Temp: (!) 97.4 F (36.3 C)  SpO2: 98%     Body mass index is 28.21 kg/m.    ECOG FS:0 - Asymptomatic  General: Well-developed, well-nourished, no acute distress. Eyes: Pink conjunctiva, anicteric sclera. HEENT: Normocephalic, moist mucous membranes. Lungs: No audible wheezing or coughing. Heart: Regular rate and rhythm. Abdomen: Soft, nontender, no obvious distention. Musculoskeletal: No edema, cyanosis, or clubbing. Neuro: Alert. Cranial nerves grossly intact. Skin: No rashes or petechiae noted. Psych: Normal affect.   LAB RESULTS:  Lab Results  Component Value Date   NA 136 10/13/2019   K  5.4 (H) 10/13/2019   CL 101 10/13/2019   CO2 26 10/13/2019   GLUCOSE 112 (H) 10/13/2019   BUN 25 (H) 10/13/2019   CREATININE 1.33 (H) 10/13/2019   CALCIUM 8.9 10/13/2019   PROT 8.0 10/13/2019   ALBUMIN 3.9 10/13/2019   AST 24 10/13/2019   ALT 22 10/13/2019   ALKPHOS 73 10/13/2019   BILITOT 1.5 (H) 10/13/2019   GFRNONAA 50 (L) 10/13/2019   GFRAA 59 (L) 10/13/2019    Lab Results  Component Value Date   WBC 8.0 10/13/2019   NEUTROABS 6.3 10/13/2019   HGB 15.8 10/13/2019   HCT 46.8 10/13/2019   MCV 95.7 10/13/2019   PLT 111 (L) 10/13/2019     STUDIES: No results found.  ASSESSMENT: Invasive bladder cancer  PLAN:    1.  Invasive bladder cancer: Patient was diagnosed with a high-grade noninvasive bladder cancer on January 25, 2018.  He received 6 infusions of intravesicular gemcitabine at that time.  Subsequent cystoscopy revealed invasive disease.  Patient is not a candidate for cystectomy or systemic chemotherapy, but he may benefit from immunotherapy treatment using Tecentriq.  CT scan  results from April 02, 2019 reviewed independently with no obvious metastatic disease outside the patient's bladder.  He was noted to have interval improvement of asymmetric bladder wall thickening.  Repeat cystoscopy on Sep 24, 2019 revealed a 5 mm area of malignancy, but otherwise was reported as normal. TURBT was not completed secondary to patient's ongoing dementia.  Proceed with cycle 14 of Tecentriq today.  Patient and his wife would like a break from treatments, therefore will return to clinic in 2 months for further evaluation and resumption of treatment.  His next cystoscopy is in approximately November 2021.    2.  Thrombocytopenia: Chronic and unchanged.  Platelet count is 111 today. 3.  Elevated bilirubin: Chronic and unchanged.  Total bilirubin is 1.5 today.  Possibly secondary to Tecentriq. 4.  Chronic renal insufficiency: Mildly improved.  Patient's creatinine is 1.33. 5.  Rash: Patient does not complain of this today.  Continue OTC treatments as needed. 6.  IV access: Patient declined port placement, but will consider one in the future if IV access becomes an issue.   Patient expressed understanding and was in agreement with this plan. He also understands that He can call clinic at any time with any questions, concerns, or complaints.     Lloyd Huger, MD   10/14/2019 7:07 AM

## 2019-10-13 ENCOUNTER — Inpatient Hospital Stay: Payer: Medicare Other | Attending: Oncology

## 2019-10-13 ENCOUNTER — Inpatient Hospital Stay (HOSPITAL_BASED_OUTPATIENT_CLINIC_OR_DEPARTMENT_OTHER): Payer: Medicare Other | Admitting: Oncology

## 2019-10-13 ENCOUNTER — Encounter: Payer: Self-pay | Admitting: Oncology

## 2019-10-13 ENCOUNTER — Inpatient Hospital Stay: Payer: Medicare Other

## 2019-10-13 ENCOUNTER — Other Ambulatory Visit: Payer: Self-pay

## 2019-10-13 VITALS — BP 104/69 | HR 71 | Resp 20

## 2019-10-13 VITALS — BP 116/70 | HR 72 | Temp 97.4°F | Resp 20 | Wt 208.0 lb

## 2019-10-13 DIAGNOSIS — C689 Malignant neoplasm of urinary organ, unspecified: Secondary | ICD-10-CM

## 2019-10-13 DIAGNOSIS — C679 Malignant neoplasm of bladder, unspecified: Secondary | ICD-10-CM | POA: Insufficient documentation

## 2019-10-13 DIAGNOSIS — Z5112 Encounter for antineoplastic immunotherapy: Secondary | ICD-10-CM | POA: Diagnosis present

## 2019-10-13 DIAGNOSIS — Z79899 Other long term (current) drug therapy: Secondary | ICD-10-CM | POA: Diagnosis not present

## 2019-10-13 LAB — COMPREHENSIVE METABOLIC PANEL
ALT: 22 U/L (ref 0–44)
AST: 24 U/L (ref 15–41)
Albumin: 3.9 g/dL (ref 3.5–5.0)
Alkaline Phosphatase: 73 U/L (ref 38–126)
Anion gap: 9 (ref 5–15)
BUN: 25 mg/dL — ABNORMAL HIGH (ref 8–23)
CO2: 26 mmol/L (ref 22–32)
Calcium: 8.9 mg/dL (ref 8.9–10.3)
Chloride: 101 mmol/L (ref 98–111)
Creatinine, Ser: 1.33 mg/dL — ABNORMAL HIGH (ref 0.61–1.24)
GFR calc Af Amer: 59 mL/min — ABNORMAL LOW (ref >60)
GFR calc non Af Amer: 50 mL/min — ABNORMAL LOW (ref >60)
Glucose, Bld: 112 mg/dL — ABNORMAL HIGH (ref 70–99)
Potassium: 5.4 mmol/L — ABNORMAL HIGH (ref 3.5–5.1)
Sodium: 136 mmol/L (ref 135–145)
Total Bilirubin: 1.5 mg/dL — ABNORMAL HIGH (ref 0.3–1.2)
Total Protein: 8 g/dL (ref 6.5–8.1)

## 2019-10-13 LAB — CBC WITH DIFFERENTIAL/PLATELET
Abs Immature Granulocytes: 0.02 10*3/uL (ref 0.00–0.07)
Basophils Absolute: 0 10*3/uL (ref 0.0–0.1)
Basophils Relative: 1 %
Eosinophils Absolute: 0.3 10*3/uL (ref 0.0–0.5)
Eosinophils Relative: 4 %
HCT: 46.8 % (ref 39.0–52.0)
Hemoglobin: 15.8 g/dL (ref 13.0–17.0)
Immature Granulocytes: 0 %
Lymphocytes Relative: 10 %
Lymphs Abs: 0.8 10*3/uL (ref 0.7–4.0)
MCH: 32.3 pg (ref 26.0–34.0)
MCHC: 33.8 g/dL (ref 30.0–36.0)
MCV: 95.7 fL (ref 80.0–100.0)
Monocytes Absolute: 0.6 10*3/uL (ref 0.1–1.0)
Monocytes Relative: 8 %
Neutro Abs: 6.3 10*3/uL (ref 1.7–7.7)
Neutrophils Relative %: 77 %
Platelets: 111 10*3/uL — ABNORMAL LOW (ref 150–400)
RBC: 4.89 MIL/uL (ref 4.22–5.81)
RDW: 13.2 % (ref 11.5–15.5)
WBC: 8 10*3/uL (ref 4.0–10.5)
nRBC: 0 % (ref 0.0–0.2)

## 2019-10-13 MED ORDER — SODIUM CHLORIDE 0.9 % IV SOLN
Freq: Once | INTRAVENOUS | Status: AC
  Filled 2019-10-13: qty 250

## 2019-10-13 MED ORDER — SODIUM CHLORIDE 0.9 % IV SOLN
1200.0000 mg | Freq: Once | INTRAVENOUS | Status: AC
  Administered 2019-10-13: 1200 mg via INTRAVENOUS
  Filled 2019-10-13: qty 20

## 2019-10-14 LAB — THYROID PANEL WITH TSH
Free Thyroxine Index: 2.1 (ref 1.2–4.9)
T3 Uptake Ratio: 30 % (ref 24–39)
T4, Total: 6.9 ug/dL (ref 4.5–12.0)
TSH: 2.14 u[IU]/mL (ref 0.450–4.500)

## 2019-11-13 ENCOUNTER — Other Ambulatory Visit: Payer: Medicare Other | Admitting: Urology

## 2019-12-04 ENCOUNTER — Ambulatory Visit (INDEPENDENT_AMBULATORY_CARE_PROVIDER_SITE_OTHER): Payer: Medicare Other | Admitting: Podiatry

## 2019-12-04 ENCOUNTER — Other Ambulatory Visit: Payer: Self-pay

## 2019-12-04 ENCOUNTER — Encounter: Payer: Self-pay | Admitting: Podiatry

## 2019-12-04 DIAGNOSIS — B351 Tinea unguium: Secondary | ICD-10-CM | POA: Diagnosis not present

## 2019-12-04 DIAGNOSIS — M79675 Pain in left toe(s): Secondary | ICD-10-CM | POA: Diagnosis not present

## 2019-12-04 DIAGNOSIS — D689 Coagulation defect, unspecified: Secondary | ICD-10-CM

## 2019-12-04 DIAGNOSIS — M79674 Pain in right toe(s): Secondary | ICD-10-CM

## 2019-12-04 NOTE — Progress Notes (Signed)
This patient returns to my office for at risk foot care.  This patient requires this care by a professional since this patient will be at risk due to having coagulation defect and lymphedema.  Patient is accompanied by his wife.  This patient is unable to cut nails himself since the patient cannot reach his nails.These nails are painful walking and wearing shoes.  This patient presents for at risk foot care today.  General Appearance  Alert, conversant and in no acute stress.  Vascular  Dorsalis pedis  are palpable  bilaterally. Posterior tibial pulses are absent  B/L. Capillary return is within normal limits  bilaterally. Cold feet   bilaterally.  Neurologic  Senn-Weinstein monofilament wire test diminished   bilaterally. Muscle power within normal limits bilaterally.  Nails Thick disfigured discolored nails with subungual debris  from hallux to fifth toes bilaterally. No evidence of bacterial infection or drainage bilaterally.  Orthopedic  No limitations of motion  feet .  No crepitus or effusions noted.  No bony pathology or digital deformities noted.  Skin  normotropic skin with no porokeratosis noted bilaterally.  No signs of infections or ulcers noted.     Onychomycosis  Pain in right toes  Pain in left toes  Consent was obtained for treatment procedures.   Mechanical debridement of nails 1-5  bilaterally performed with a nail nipper.  Filed with dremel without incident.    Return office visit    4 months                  Told patient to return for periodic foot care and evaluation due to potential at risk complications.   Gardiner Barefoot DPM

## 2019-12-12 ENCOUNTER — Other Ambulatory Visit: Payer: Self-pay

## 2019-12-12 DIAGNOSIS — C679 Malignant neoplasm of bladder, unspecified: Secondary | ICD-10-CM

## 2019-12-12 NOTE — Progress Notes (Signed)
[place]  Telephone:(336) [phone] [phone]) [phone]  ID: Kyle Gregory OB: 10/26/1940  MR#: [id number]  TGG#:[id number]  Patient Care Team: Rusty Aus, MD as PCP - General (Internal Medicine)  CHIEF COMPLAINT: Invasive bladder cancer  INTERVAL HISTORY: Patient returns to clinic today for further evaluation and reinitiation of Tecentriq.  He has not received treatment in several months per patient request. Given his underlying dementia, the entire history is given by his wife.  He recently underwent treatment for UTI, but this has resolved and he is back to his baseline.  He otherwise has felt well.  He has no neurologic complaints. He denies any recent fevers or illnesses.  He has a good appetite and denies weight loss.  He denies any chest pain, shortness of breath, cough, or hemoptysis.  He denies any nausea, vomiting, constipation, or diarrhea.  He has no urinary complaints.  He denies hematuria.  Patient offers no further specific complaints today.  REVIEW OF SYSTEMS:   Review of Systems  Constitutional: Negative.  Negative for fever, malaise/fatigue and weight loss.  Respiratory: Negative.  Negative for cough and shortness of breath.   Cardiovascular: Negative.  Negative for chest pain and leg swelling.  Gastrointestinal: Negative.  Negative for abdominal pain.  Genitourinary: Negative.  Negative for dysuria and hematuria.  Musculoskeletal: Negative.  Negative for back pain.  Skin: Negative.  Negative for rash.  Neurological: Negative.  Negative for focal weakness, weakness and headaches.  Psychiatric/Behavioral: Positive for memory loss. The patient is not nervous/anxious.     As per HPI. Otherwise, a complete review of systems is negative.  PAST MEDICAL HISTORY: Past Medical History:  Diagnosis Date  . Anemia   . Aortic stenosis   . Arrhythmia   . Atrial fibrillation ([place])   . Bladder cancer ([place])   . Broken skin    buttocks  . Cardiomyopathy  ([place])   . CHF (congestive heart failure) ([place])   . COPD (chronic obstructive pulmonary disease) ([place])   . Coronary artery disease   . Dementia ([place])   . Dysrhythmia   . Elevated lipids   . GERD (gastroesophageal reflux disease)   . Heartburn   . Lower extremity edema   . Presence of permanent cardiac pacemaker   . Stroke ([place])    x 2 after heart surgery  . VRE (vancomycin resistant enterococcus) culture positive     PAST SURGICAL HISTORY: Past Surgical History:  Procedure Laterality Date  . CARDIAC VALVE REPLACEMENT    . 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 CYSTOSCOPY W/ RETROGRADES Bilateral 08/14/2016   Procedure: CYSTOSCOPY WITH RETROGRADE PYELOGRAM;  Surgeon: Hollice Espy, MD;  Location: [facility];  Service: Urology;  Laterality: Bilateral;  . LEFT HEART CATH AND CORONARY ANGIOGRAPHY Left 09/05/2016   Procedure: Left Heart Cath and Coronary Angiography;  Surgeon: Teodoro Spray, MD;  Location: [place];  Service: Cardiovascular;  Laterality: Left;  . neck growth removal     1970's  . PACEMAKER INSERTION N/A 06/24/2018   Procedure: INSERTION PACEMAKER;  Surgeon: Isaias Cowman, MD;  Location: [facility];  Service: Cardiovascular;  Laterality: N/A;  . TEMPORARY PACEMAKER N/A 06/22/2018   Procedure: TEMPORARY PACEMAKER;  Surgeon: Yolonda Kida, MD;  Location: [facility];  Service: Cardiovascular;  Laterality: N/A;  . TONSILLECTOMY    . TRANSURETHRAL RESECTION OF BLADDER TUMOR N/A 08/14/2016   Procedure: TRANSURETHRAL RESECTION OF BLADDER TUMOR (TURBT);  Surgeon: Hollice Espy, MD;  Location: [facility];  Service: Urology;  Laterality: N/A;  . TRANSURETHRAL RESECTION OF BLADDER TUMOR N/A 01/25/2018   Procedure: TRANSURETHRAL RESECTION OF BLADDER TUMOR (TURBT);  Surgeon: Billey Co, MD;  Location: [facility];  Service: Urology;  Laterality: N/A;  . TRANSURETHRAL RESECTION OF BLADDER TUMOR N/A 11/06/2018    Procedure: TRANSURETHRAL RESECTION OF BLADDER TUMOR (TURBT);  Surgeon: Billey Co, MD;  Location: [facility];  Service: Urology;  Laterality: N/A;    FAMILY HISTORY: Family History  Problem Relation Age of Onset  . Heart failure Father   . Heart failure Brother   . Atrial fibrillation Sister   . Prostate cancer Neg Hx   . Kidney cancer Neg Hx   . Bladder Cancer Neg Hx     ADVANCED DIRECTIVES (Y/N):  N  HEALTH MAINTENANCE: Social History   Tobacco Use  . Smoking status: Never Smoker  . Smokeless tobacco: Never Used  Substance Use Topics  . Alcohol use: No  . Drug use: No     Colonoscopy:  PAP:  Bone density:  Lipid panel:  Allergies  Allergen Reactions  . Amiodarone Other (See Comments)    Dizziness  . Donepezil Palpitations    Severe bradycardia    Current Outpatient Medications  Medication Sig Dispense Refill  . apixaban (ELIQUIS) 5 MG TABS tablet Take 1 tablet (5 mg total) by mouth 2 (two) times daily. 60 tablet 0  . Calcium Carb-Cholecalciferol (OYSTER SHELL CALCIUM) 500-400 MG-UNIT TABS Take by mouth.    . cyanocobalamin (,VITAMIN B-12,) 1000 MCG/ML injection Inject into the muscle.    . ferrous sulfate 325 (65 FE) MG tablet Take 325 mg by mouth 2 (two) times daily with a meal.    . FLUoxetine (PROZAC) 20 MG capsule Take 20 mg by mouth daily.     . Melatonin 3 MG TABS Take 3 mg by mouth at bedtime.     . mupirocin ointment (BACTROBAN) 2 %     . nitroGLYCERIN (NITROSTAT) 0.4 MG SL tablet Place 0.4 mg under the tongue every 5 (five) minutes as needed for chest pain (Up to 3 doses).     Marland Kitchen omeprazole (PRILOSEC) 20 MG capsule Take 20 mg by mouth daily as needed (Heartburn or acid reflux).     . pravastatin (PRAVACHOL) 40 MG tablet Take 40 mg by mouth at bedtime.   11  . spironolactone (ALDACTONE) 25 MG tablet Take 25 mg by mouth daily.     Marland Kitchen zinc oxide ([employer] ZINC OXIDE) 20 % ointment Apply 1 application topically 2 (two) times daily as needed for irritation.        No current facility-administered medications for this visit.   Facility-Administered Medications Ordered in Other Visits  Medication Dose Route Frequency Provider Last Rate Last Admin  . 0.9 %  sodium chloride infusion   Intravenous Once Lloyd Huger, MD        OBJECTIVE: Vitals:   12/15/19 1034  BP: 121/80  Pulse: 70  Resp: 20  Temp: (!) 97.5 F (36.4 C)  SpO2: 100%     Body mass index is 28.37 kg/m.    ECOG FS:0 - Asymptomatic  General: Well-developed, well-nourished, no acute distress. Eyes: Pink conjunctiva, anicteric sclera. HEENT: Normocephalic, moist mucous membranes. Lungs: No audible wheezing or coughing. Heart: Regular rate and rhythm. Abdomen: Soft, nontender, no obvious distention. Musculoskeletal: No edema, cyanosis, or clubbing. Neuro: Alert, confused. Cranial nerves grossly intact. Skin: No rashes or petechiae noted.  Psych: Normal affect.  LAB RESULTS:  Lab Results  Component Value Date   NA 139 12/15/2019   K 4.3 12/15/2019   CL 105 12/15/2019   CO2 27 12/15/2019   GLUCOSE 70 12/15/2019   BUN 30 (H) 12/15/2019   CREATININE 1.37 (H) 12/15/2019   CALCIUM 8.9 12/15/2019   PROT 8.0 12/15/2019   ALBUMIN 4.0 12/15/2019   AST 27 12/15/2019   ALT 26 12/15/2019   ALKPHOS 70 12/15/2019   BILITOT 1.8 (H) 12/15/2019   GFRNONAA 49 (L) 12/15/2019   GFRAA 56 (L) 12/15/2019    Lab Results  Component Value Date   WBC 6.4 12/15/2019   NEUTROABS 4.6 12/15/2019   HGB 16.0 12/15/2019   HCT 46.2 12/15/2019   MCV 94.7 12/15/2019   PLT 97 (L) 12/15/2019     STUDIES: No results found.  ASSESSMENT: Invasive bladder cancer  PLAN:    1.  Invasive bladder cancer: Patient was diagnosed with a high-grade noninvasive bladder cancer on January 25, 2018.  He received 6 infusions of intravesicular gemcitabine at that time.  Subsequent cystoscopy revealed invasive disease.  Patient is not a candidate for cystectomy or systemic chemotherapy, but he may  benefit from immunotherapy treatment using Tecentriq.  CT scan results from April 02, 2019 reviewed independently with no obvious metastatic disease outside the patient's bladder. He was noted to have interval improvement of asymmetric bladder wall thickening.  Repeat cystoscopy on Sep 24, 2019 revealed a 5 mm area of malignancy, but otherwise was reported as normal. TURBT was not completed secondary to patient's ongoing dementia.  Proceed with cycle 17 of Tecentriq today.  Return to clinic in 3 weeks for further evaluation and consideration of cycle 18. His next cystoscopy is in approximately November 2021.    2.  Thrombocytopenia: Chronic and unchanged.  Platelet count is 97 today. 3.  Elevated bilirubin: Chronic and unchanged.  Total bilirubin is 1.8 today.  Possibly secondary to Tecentriq. 4.  Chronic renal insufficiency: Chronic and unchanged.  Patient creatinine is 1.37. 5.  Rash: Patient does not complain of this today.  Continue OTC treatments as needed. 6.  IV access: Patient declined port placement, but will consider one in the future if IV access becomes an issue.   Patient expressed understanding and was in agreement with this plan. He also understands that He can call clinic at any time with any questions, concerns, or complaints.     Lloyd Huger, MD   12/15/2019 12:29 PM

## 2019-12-15 ENCOUNTER — Encounter: Payer: Self-pay | Admitting: Oncology

## 2019-12-15 ENCOUNTER — Inpatient Hospital Stay: Payer: Medicare Other | Attending: Oncology

## 2019-12-15 ENCOUNTER — Inpatient Hospital Stay: Payer: Medicare Other

## 2019-12-15 ENCOUNTER — Inpatient Hospital Stay (HOSPITAL_BASED_OUTPATIENT_CLINIC_OR_DEPARTMENT_OTHER): Payer: Medicare Other | Admitting: Oncology

## 2019-12-15 ENCOUNTER — Other Ambulatory Visit: Payer: Self-pay

## 2019-12-15 VITALS — BP 121/80 | HR 70 | Temp 97.5°F | Resp 20 | Wt 209.2 lb

## 2019-12-15 DIAGNOSIS — C679 Malignant neoplasm of bladder, unspecified: Secondary | ICD-10-CM | POA: Insufficient documentation

## 2019-12-15 DIAGNOSIS — Z79899 Other long term (current) drug therapy: Secondary | ICD-10-CM | POA: Insufficient documentation

## 2019-12-15 DIAGNOSIS — C689 Malignant neoplasm of urinary organ, unspecified: Secondary | ICD-10-CM

## 2019-12-15 DIAGNOSIS — Z5112 Encounter for antineoplastic immunotherapy: Secondary | ICD-10-CM | POA: Insufficient documentation

## 2019-12-15 LAB — CBC WITH DIFFERENTIAL/PLATELET
Abs Immature Granulocytes: 0.01 10*3/uL (ref 0.00–0.07)
Basophils Absolute: 0 10*3/uL (ref 0.0–0.1)
Basophils Relative: 1 %
Eosinophils Absolute: 0.2 10*3/uL (ref 0.0–0.5)
Eosinophils Relative: 4 %
HCT: 46.2 % (ref 39.0–52.0)
Hemoglobin: 16 g/dL (ref 13.0–17.0)
Immature Granulocytes: 0 %
Lymphocytes Relative: 13 %
Lymphs Abs: 0.8 10*3/uL (ref 0.7–4.0)
MCH: 32.8 pg (ref 26.0–34.0)
MCHC: 34.6 g/dL (ref 30.0–36.0)
MCV: 94.7 fL (ref 80.0–100.0)
Monocytes Absolute: 0.7 10*3/uL (ref 0.1–1.0)
Monocytes Relative: 10 %
Neutro Abs: 4.6 10*3/uL (ref 1.7–7.7)
Neutrophils Relative %: 72 %
Platelets: 97 10*3/uL — ABNORMAL LOW (ref 150–400)
RBC: 4.88 MIL/uL (ref 4.22–5.81)
RDW: 12.9 % (ref 11.5–15.5)
WBC: 6.4 10*3/uL (ref 4.0–10.5)
nRBC: 0 % (ref 0.0–0.2)

## 2019-12-15 LAB — COMPREHENSIVE METABOLIC PANEL
ALT: 26 U/L (ref 0–44)
AST: 27 U/L (ref 15–41)
Albumin: 4 g/dL (ref 3.5–5.0)
Alkaline Phosphatase: 70 U/L (ref 38–126)
Anion gap: 7 (ref 5–15)
BUN: 30 mg/dL — ABNORMAL HIGH (ref 8–23)
CO2: 27 mmol/L (ref 22–32)
Calcium: 8.9 mg/dL (ref 8.9–10.3)
Chloride: 105 mmol/L (ref 98–111)
Creatinine, Ser: 1.37 mg/dL — ABNORMAL HIGH (ref 0.61–1.24)
GFR calc Af Amer: 56 mL/min — ABNORMAL LOW (ref >60)
GFR calc non Af Amer: 49 mL/min — ABNORMAL LOW (ref >60)
Glucose, Bld: 70 mg/dL (ref 70–99)
Potassium: 4.3 mmol/L (ref 3.5–5.1)
Sodium: 139 mmol/L (ref 135–145)
Total Bilirubin: 1.8 mg/dL — ABNORMAL HIGH (ref 0.3–1.2)
Total Protein: 8 g/dL (ref 6.5–8.1)

## 2019-12-15 MED ORDER — SODIUM CHLORIDE 0.9 % IV SOLN
Freq: Once | INTRAVENOUS | Status: AC
  Filled 2019-12-15: qty 250

## 2019-12-15 MED ORDER — SODIUM CHLORIDE 0.9 % IV SOLN
1200.0000 mg | Freq: Once | INTRAVENOUS | Status: AC
  Administered 2019-12-15: 1200 mg via INTRAVENOUS
  Filled 2019-12-15: qty 20

## 2019-12-15 NOTE — Progress Notes (Signed)
Ok to proceed with Plt 97 per md

## 2019-12-15 NOTE — Progress Notes (Signed)
1140: Per Dr. Grayland Ormond okay to proceed with Platelets of 97 and Bilirubin of 1.8.

## 2019-12-16 LAB — THYROID PANEL WITH TSH
Free Thyroxine Index: 2 (ref 1.2–4.9)
T3 Uptake Ratio: 28 % (ref 24–39)
T4, Total: 7.3 ug/dL (ref 4.5–12.0)
TSH: 2.36 u[IU]/mL (ref 0.450–4.500)

## 2020-01-02 ENCOUNTER — Encounter: Payer: Self-pay | Admitting: Oncology

## 2020-01-02 NOTE — Progress Notes (Signed)
Spoke with patient's wife for patient's pre assessment. She reports no pain or concerns at this time. States patient's memory does seem to be getting worse.

## 2020-01-03 NOTE — Progress Notes (Signed)
Kyle Gregory  [phone] [phone]) [phone]  ID: YOANDRI CONGROVE OB: 11/18/1940  MR#: [id number]  PVX#:[id number]  Patient Care Team: Rusty Aus, MD as PCP - General (Internal Medicine)  CHIEF COMPLAINT: Invasive bladder cancer  INTERVAL HISTORY: Patient returns to clinic today for further evaluation and continuation of Tecentriq.  He is lethargic today, but easily arousable.  Given his underlying dementia, the entire history is given by his wife.  He has no neurologic complaints. He denies any recent fevers or illnesses.  He has a good appetite and denies weight loss.  He denies any chest pain, shortness of breath, cough, or hemoptysis.  He denies any nausea, vomiting, constipation, or diarrhea.  He has no urinary complaints.  He denies hematuria.  Patient offers no further specific complaints today.  REVIEW OF SYSTEMS:   Review of Systems  Constitutional: Negative.  Negative for fever, malaise/fatigue and weight loss.  Respiratory: Negative.  Negative for cough and shortness of breath.   Cardiovascular: Negative.  Negative for chest pain and leg swelling.  Gastrointestinal: Negative.  Negative for abdominal pain.  Genitourinary: Negative.  Negative for dysuria and hematuria.  Musculoskeletal: Negative.  Negative for back pain.  Skin: Negative.  Negative for rash.  Neurological: Negative.  Negative for focal weakness, weakness and headaches.  Psychiatric/Behavioral: Positive for memory loss. The patient is not nervous/anxious.     As per HPI. Otherwise, a complete review of systems is negative.  PAST MEDICAL HISTORY: Past Medical History:  Diagnosis Date  . Anemia   . Aortic stenosis   . Arrhythmia   . Atrial fibrillation ([place])   . Bladder cancer ([place])   . Broken skin    buttocks  . Cardiomyopathy ([place])   . CHF (congestive heart failure) ([place])   . COPD (chronic obstructive pulmonary disease) ([place])   . Coronary artery disease   . Dementia  ([place])   . Dysrhythmia   . Elevated lipids   . GERD (gastroesophageal reflux disease)   . Heartburn   . Lower extremity edema   . Presence of permanent cardiac pacemaker   . Stroke ([place])    x 2 after heart surgery  . VRE (vancomycin resistant enterococcus) culture positive     PAST SURGICAL HISTORY: Past Surgical History:  Procedure Laterality Date  . CARDIAC VALVE REPLACEMENT    . 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 CYSTOSCOPY W/ RETROGRADES Bilateral 08/14/2016   Procedure: CYSTOSCOPY WITH RETROGRADE PYELOGRAM;  Surgeon: Hollice Espy, MD;  Location: [facility];  Service: Urology;  Laterality: Bilateral;  . LEFT HEART CATH AND CORONARY ANGIOGRAPHY Left 09/05/2016   Procedure: Left Heart Cath and Coronary Angiography;  Surgeon: Teodoro Spray, MD;  Location: [place] [facility];  Service: Cardiovascular;  Laterality: Left;  . neck growth removal     1970's  . PACEMAKER INSERTION N/A 06/24/2018   Procedure: INSERTION PACEMAKER;  Surgeon: Isaias Cowman, MD;  Location: [facility];  Service: Cardiovascular;  Laterality: N/A;  . TEMPORARY PACEMAKER N/A 06/22/2018   Procedure: TEMPORARY PACEMAKER;  Surgeon: Yolonda Kida, MD;  Location: [place] [facility];  Service: Cardiovascular;  Laterality: N/A;  . TONSILLECTOMY    . TRANSURETHRAL RESECTION OF BLADDER TUMOR N/A 08/14/2016   Procedure: TRANSURETHRAL RESECTION OF BLADDER TUMOR (TURBT);  Surgeon: Hollice Espy, MD;  Location: [facility];  Service: Urology;  Laterality: N/A;  . TRANSURETHRAL RESECTION OF BLADDER TUMOR N/A 01/25/2018   Procedure:  TRANSURETHRAL RESECTION OF BLADDER TUMOR (TURBT);  Surgeon: Billey Co, MD;  Location: [facility];  Service: Urology;  Laterality: N/A;  . TRANSURETHRAL RESECTION OF BLADDER TUMOR N/A 11/06/2018   Procedure: TRANSURETHRAL RESECTION OF BLADDER TUMOR (TURBT);  Surgeon: Billey Co, MD;  Location: [facility];  Service: Urology;  Laterality: N/A;      FAMILY HISTORY: Family History  Problem Relation Age of Onset  . Heart failure Father   . Heart failure Brother   . Atrial fibrillation Sister   . Prostate cancer Neg Hx   . Kidney cancer Neg Hx   . Bladder Cancer Neg Hx     ADVANCED DIRECTIVES (Y/N):  N  HEALTH MAINTENANCE: Social History   Tobacco Use  . Smoking status: Never Smoker  . Smokeless tobacco: Never Used  Substance Use Topics  . Alcohol use: No  . Drug use: No     Colonoscopy:  PAP:  Bone density:  Lipid panel:  Allergies  Allergen Reactions  . Amiodarone Other (See Comments)    Dizziness  . Donepezil Palpitations    Severe bradycardia    Current Outpatient Medications  Medication Sig Dispense Refill  . apixaban (ELIQUIS) 5 MG TABS tablet Take 1 tablet (5 mg total) by mouth 2 (two) times daily. 60 tablet 0  . Calcium Carb-Cholecalciferol (OYSTER SHELL CALCIUM) 500-400 MG-UNIT TABS Take by mouth.    . cyanocobalamin (,VITAMIN B-12,) 1000 MCG/ML injection Inject into the muscle.    . ferrous sulfate 325 (65 FE) MG tablet Take 325 mg by mouth 2 (two) times daily with a meal.    . FLUoxetine (PROZAC) 20 MG capsule Take 20 mg by mouth daily.     . Melatonin 3 MG TABS Take 3 mg by mouth at bedtime.     . mupirocin ointment (BACTROBAN) 2 %     . nitroGLYCERIN (NITROSTAT) 0.4 MG SL tablet Place 0.4 mg under the tongue every 5 (five) minutes as needed for chest pain (Up to 3 doses).     Marland Kitchen omeprazole (PRILOSEC) 20 MG capsule Take 20 mg by mouth daily as needed (Heartburn or acid reflux).     . pravastatin (PRAVACHOL) 40 MG tablet Take 40 mg by mouth at bedtime.   11  . spironolactone (ALDACTONE) 25 MG tablet Take 25 mg by mouth daily.     Marland Kitchen zinc oxide ([employer] ZINC OXIDE) 20 % ointment Apply 1 application topically 2 (two) times daily as needed for irritation.      No current facility-administered medications for this visit.   Facility-Administered Medications Ordered in Other Visits  Medication Dose Route  Frequency Provider Last Rate Last Admin  . 0.9 %  sodium chloride infusion   Intravenous Once Lloyd Huger, MD        OBJECTIVE: Vitals:   01/06/20 1348  BP: (!) 105/94  Pulse: 71  Resp: 20  Temp: (!) 97 F (36.1 C)  SpO2: 100%     Body mass index is 28.41 kg/m.    ECOG FS:0 - Asymptomatic  General: Well-developed, well-nourished, no acute distress. Eyes: Pink conjunctiva, anicteric sclera. HEENT: Normocephalic, moist mucous membranes. Lungs: No audible wheezing or coughing. Heart: Regular rate and rhythm. Abdomen: Soft, nontender, no obvious distention. Musculoskeletal: No edema, cyanosis, or clubbing. Neuro: Lethargic, but arousable.  Cranial nerves grossly intact. Skin: No rashes or petechiae noted. Psych: Normal affect.   LAB RESULTS:  Lab Results  Component Value Date   NA 135 01/06/2020   K 5.0  01/06/2020   CL 101 01/06/2020   CO2 26 01/06/2020   GLUCOSE 116 (H) 01/06/2020   BUN 37 (H) 01/06/2020   CREATININE 1.38 (H) 01/06/2020   CALCIUM 8.8 (L) 01/06/2020   PROT 8.0 01/06/2020   ALBUMIN 4.2 01/06/2020   AST 27 01/06/2020   ALT 27 01/06/2020   ALKPHOS 74 01/06/2020   BILITOT 1.4 (H) 01/06/2020   GFRNONAA 48 (L) 01/06/2020   GFRAA 56 (L) 01/06/2020    Lab Results  Component Value Date   WBC 7.5 01/06/2020   NEUTROABS 5.8 01/06/2020   HGB 16.5 01/06/2020   HCT 47.7 01/06/2020   MCV 93.9 01/06/2020   PLT 102 (L) 01/06/2020     STUDIES: No results found.  ASSESSMENT: Invasive bladder cancer  PLAN:    1.  Invasive bladder cancer: Patient was diagnosed with a high-grade noninvasive bladder cancer on January 25, 2018.  He received 6 infusions of intravesicular gemcitabine at that time.  Subsequent cystoscopy revealed invasive disease.  Patient is not a candidate for cystectomy or systemic chemotherapy, but he may benefit from immunotherapy treatment using Tecentriq.  CT scan results from April 02, 2019 reviewed independently with no  obvious metastatic disease outside the patient's bladder. He was noted to have interval improvement of asymmetric bladder wall thickening.  Repeat cystoscopy on Sep 24, 2019 revealed a 5 mm area of malignancy, but otherwise was reported as normal. TURBT was not completed secondary to patient's ongoing dementia.  Proceed with cycle 18 of Tecentriq today.  Return to clinic in 3 weeks for further evaluation and consideration of cycle 19. His next cystoscopy is in approximately November 2021.    2.  Thrombocytopenia: Chronic and unchanged.  Platelet count was 102 today. 3.  Elevated bilirubin: Chronic and unchanged.  Total bilirubin mildly improved to 1.4 today.  Possibly secondary to Tecentriq. 4.  Chronic renal insufficiency: Chronic and unchanged.  Patient creatinine is 1.38. 5.  IV access: Patient declined port placement, but will consider one in the future if IV access becomes an issue.   I spent a total of 30 minutes reviewing chart data, face-to-face evaluation with the patient, counseling and coordination of care as detailed above.   Patient expressed understanding and was in agreement with this plan. He also understands that He can call clinic at any time with any questions, concerns, or complaints.     Lloyd Huger, MD   01/06/2020 4:40 PM

## 2020-01-06 ENCOUNTER — Other Ambulatory Visit: Payer: Self-pay

## 2020-01-06 ENCOUNTER — Inpatient Hospital Stay (HOSPITAL_BASED_OUTPATIENT_CLINIC_OR_DEPARTMENT_OTHER): Payer: Medicare Other | Admitting: Oncology

## 2020-01-06 ENCOUNTER — Inpatient Hospital Stay: Payer: Medicare Other | Attending: Oncology

## 2020-01-06 ENCOUNTER — Inpatient Hospital Stay: Payer: Medicare Other

## 2020-01-06 VITALS — BP 105/94 | HR 71 | Temp 97.0°F | Resp 20 | Wt 209.5 lb

## 2020-01-06 DIAGNOSIS — C679 Malignant neoplasm of bladder, unspecified: Secondary | ICD-10-CM

## 2020-01-06 DIAGNOSIS — Z5112 Encounter for antineoplastic immunotherapy: Secondary | ICD-10-CM | POA: Insufficient documentation

## 2020-01-06 DIAGNOSIS — C689 Malignant neoplasm of urinary organ, unspecified: Secondary | ICD-10-CM

## 2020-01-06 DIAGNOSIS — Z79899 Other long term (current) drug therapy: Secondary | ICD-10-CM | POA: Diagnosis not present

## 2020-01-06 LAB — CBC WITH DIFFERENTIAL/PLATELET
Abs Immature Granulocytes: 0.06 10*3/uL (ref 0.00–0.07)
Basophils Absolute: 0 10*3/uL (ref 0.0–0.1)
Basophils Relative: 1 %
Eosinophils Absolute: 0.2 10*3/uL (ref 0.0–0.5)
Eosinophils Relative: 3 %
HCT: 47.7 % (ref 39.0–52.0)
Hemoglobin: 16.5 g/dL (ref 13.0–17.0)
Immature Granulocytes: 1 %
Lymphocytes Relative: 11 %
Lymphs Abs: 0.8 10*3/uL (ref 0.7–4.0)
MCH: 32.5 pg (ref 26.0–34.0)
MCHC: 34.6 g/dL (ref 30.0–36.0)
MCV: 93.9 fL (ref 80.0–100.0)
Monocytes Absolute: 0.6 10*3/uL (ref 0.1–1.0)
Monocytes Relative: 8 %
Neutro Abs: 5.8 10*3/uL (ref 1.7–7.7)
Neutrophils Relative %: 76 %
Platelets: 102 10*3/uL — ABNORMAL LOW (ref 150–400)
RBC: 5.08 MIL/uL (ref 4.22–5.81)
RDW: 12.9 % (ref 11.5–15.5)
WBC: 7.5 10*3/uL (ref 4.0–10.5)
nRBC: 0 % (ref 0.0–0.2)

## 2020-01-06 LAB — COMPREHENSIVE METABOLIC PANEL
ALT: 27 U/L (ref 0–44)
AST: 27 U/L (ref 15–41)
Albumin: 4.2 g/dL (ref 3.5–5.0)
Alkaline Phosphatase: 74 U/L (ref 38–126)
Anion gap: 8 (ref 5–15)
BUN: 37 mg/dL — ABNORMAL HIGH (ref 8–23)
CO2: 26 mmol/L (ref 22–32)
Calcium: 8.8 mg/dL — ABNORMAL LOW (ref 8.9–10.3)
Chloride: 101 mmol/L (ref 98–111)
Creatinine, Ser: 1.38 mg/dL — ABNORMAL HIGH (ref 0.61–1.24)
GFR calc Af Amer: 56 mL/min — ABNORMAL LOW (ref >60)
GFR calc non Af Amer: 48 mL/min — ABNORMAL LOW (ref >60)
Glucose, Bld: 116 mg/dL — ABNORMAL HIGH (ref 70–99)
Potassium: 5 mmol/L (ref 3.5–5.1)
Sodium: 135 mmol/L (ref 135–145)
Total Bilirubin: 1.4 mg/dL — ABNORMAL HIGH (ref 0.3–1.2)
Total Protein: 8 g/dL (ref 6.5–8.1)

## 2020-01-06 MED ORDER — SODIUM CHLORIDE 0.9 % IV SOLN
Freq: Once | INTRAVENOUS | Status: AC
  Filled 2020-01-06: qty 250

## 2020-01-06 MED ORDER — SODIUM CHLORIDE 0.9 % IV SOLN
1200.0000 mg | Freq: Once | INTRAVENOUS | Status: AC
  Administered 2020-01-06: 1200 mg via INTRAVENOUS
  Filled 2020-01-06: qty 20

## 2020-01-07 LAB — THYROID PANEL WITH TSH
Free Thyroxine Index: 2 (ref 1.2–4.9)
T3 Uptake Ratio: 28 % (ref 24–39)
T4, Total: 7.2 ug/dL (ref 4.5–12.0)
TSH: 2.79 u[IU]/mL (ref 0.450–4.500)

## 2020-01-23 NOTE — Progress Notes (Signed)
Kyle Gregory  [phone] Fax:(336) [phone]  ID: DRAKEN FARRIOR OB: [birth year]  MR#: [id number]  OBS#:[id number]  Patient Care Team: Rusty Aus, MD as PCP - General (Internal Medicine)  CHIEF COMPLAINT: Invasive bladder cancer  INTERVAL HISTORY: Patient returns to clinic today for further evaluation and continuation of Tecentriq.  He is more alert today.  Given his underlying dementia, the entire history is given by his wife.  He has no neurologic complaints. He denies any recent fevers or illnesses.  He has a good appetite and denies weight loss.  He denies any chest pain, shortness of breath, cough, or hemoptysis.  He denies any nausea, vomiting, constipation, or diarrhea.  He has no urinary complaints.  He denies hematuria.  Patient offers no specific complaints today.  REVIEW OF SYSTEMS:   Review of Systems  Constitutional: Negative.  Negative for fever, malaise/fatigue and weight loss.  Respiratory: Negative.  Negative for cough and shortness of breath.   Cardiovascular: Negative.  Negative for chest pain and leg swelling.  Gastrointestinal: Negative.  Negative for abdominal pain.  Genitourinary: Negative.  Negative for dysuria and hematuria.  Musculoskeletal: Negative.  Negative for back pain.  Skin: Negative.  Negative for rash.  Neurological: Negative.  Negative for focal weakness, weakness and headaches.  Psychiatric/Behavioral: Positive for memory loss. The patient is not nervous/anxious.     As per HPI. Otherwise, a complete review of systems is negative.  PAST MEDICAL HISTORY: Past Medical History:  Diagnosis Date  . Anemia   . Aortic stenosis   . Arrhythmia   . Atrial fibrillation ([place])   . Bladder cancer ([place])   . Broken skin    buttocks  . Cardiomyopathy ([place])   . CHF (congestive heart failure) ([place])   . COPD (chronic obstructive pulmonary disease) ([place])   . Coronary artery disease   . Dementia ([place])   . Dysrhythmia   .  Elevated lipids   . GERD (gastroesophageal reflux disease)   . Heartburn   . Lower extremity edema   . Presence of permanent cardiac pacemaker   . Stroke ([place])    x 2 after heart surgery  . VRE (vancomycin resistant enterococcus) culture positive     PAST SURGICAL HISTORY: Past Surgical History:  Procedure Laterality Date  . CARDIAC VALVE REPLACEMENT    . 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 CYSTOSCOPY W/ RETROGRADES Bilateral 08/14/2016   Procedure: CYSTOSCOPY WITH RETROGRADE PYELOGRAM;  Surgeon: Hollice Espy, MD;  Location: [facility];  Service: Urology;  Laterality: Bilateral;  . LEFT HEART CATH AND CORONARY ANGIOGRAPHY Left 09/05/2016   Procedure: Left Heart Cath and Coronary Angiography;  Surgeon: Teodoro Spray, MD;  Location: [place] [facility];  Service: Cardiovascular;  Laterality: Left;  . neck growth removal     1970's  . PACEMAKER INSERTION N/A 06/24/2018   Procedure: INSERTION PACEMAKER;  Surgeon: Isaias Cowman, MD;  Location: [facility];  Service: Cardiovascular;  Laterality: N/A;  . TEMPORARY PACEMAKER N/A 06/22/2018   Procedure: TEMPORARY PACEMAKER;  Surgeon: Yolonda Kida, MD;  Location: [place] [facility];  Service: Cardiovascular;  Laterality: N/A;  . TONSILLECTOMY    . TRANSURETHRAL RESECTION OF BLADDER TUMOR N/A 08/14/2016   Procedure: TRANSURETHRAL RESECTION OF BLADDER TUMOR (TURBT);  Surgeon: Hollice Espy, MD;  Location: [facility];  Service: Urology;  Laterality: N/A;  . TRANSURETHRAL RESECTION OF BLADDER TUMOR N/A 01/25/2018   Procedure: TRANSURETHRAL RESECTION OF  BLADDER TUMOR (TURBT);  Surgeon: Billey Co, MD;  Location: [facility];  Service: Urology;  Laterality: N/A;  . TRANSURETHRAL RESECTION OF BLADDER TUMOR N/A 11/06/2018   Procedure: TRANSURETHRAL RESECTION OF BLADDER TUMOR (TURBT);  Surgeon: Billey Co, MD;  Location: [facility];  Service: Urology;  Laterality: N/A;    FAMILY HISTORY: Family  History  Problem Relation Age of Onset  . Heart failure Father   . Heart failure Brother   . Atrial fibrillation Sister   . Prostate cancer Neg Hx   . Kidney cancer Neg Hx   . Bladder Cancer Neg Hx     ADVANCED DIRECTIVES (Y/N):  N  HEALTH MAINTENANCE: Social History   Tobacco Use  . Smoking status: Never Smoker  . Smokeless tobacco: Never Used  Substance Use Topics  . Alcohol use: No  . Drug use: No     Colonoscopy:  PAP:  Bone density:  Lipid panel:  Allergies  Allergen Reactions  . Amiodarone Other (See Comments)    Dizziness  . Donepezil Palpitations    Severe bradycardia    Current Outpatient Medications  Medication Sig Dispense Refill  . apixaban (ELIQUIS) 5 MG TABS tablet Take 1 tablet (5 mg total) by mouth 2 (two) times daily. 60 tablet 0  . Calcium Carb-Cholecalciferol (OYSTER SHELL CALCIUM) 500-400 MG-UNIT TABS Take by mouth.    . cyanocobalamin (,VITAMIN B-12,) 1000 MCG/ML injection Inject into the muscle.    . ferrous sulfate 325 (65 FE) MG tablet Take 325 mg by mouth 2 (two) times daily with a meal.    . FLUoxetine (PROZAC) 20 MG capsule Take 20 mg by mouth daily.     . Melatonin 3 MG TABS Take 3 mg by mouth at bedtime.     . mupirocin ointment (BACTROBAN) 2 %     . nitroGLYCERIN (NITROSTAT) 0.4 MG SL tablet Place 0.4 mg under the tongue every 5 (five) minutes as needed for chest pain (Up to 3 doses).     Marland Kitchen omeprazole (PRILOSEC) 20 MG capsule Take 20 mg by mouth daily as needed (Heartburn or acid reflux).     . pravastatin (PRAVACHOL) 40 MG tablet Take 40 mg by mouth at bedtime.   11  . spironolactone (ALDACTONE) 25 MG tablet Take 25 mg by mouth daily.     Marland Kitchen zinc oxide ([employer] ZINC OXIDE) 20 % ointment Apply 1 application topically 2 (two) times daily as needed for irritation.      No current facility-administered medications for this visit.   Facility-Administered Medications Ordered in Other Visits  Medication Dose Route Frequency Provider Last Rate  Last Admin  . 0.9 %  sodium chloride infusion   Intravenous Once Lloyd Huger, MD        OBJECTIVE: Vitals:   01/27/20 1316  BP: 123/78  Pulse: 73  Resp: 18  Temp: 97.9 F (36.6 C)  SpO2: 92%     Body mass index is 28.77 kg/m.    ECOG FS:0 - Asymptomatic  General: Well-developed, well-nourished, no acute distress. Eyes: Pink conjunctiva, anicteric sclera. HEENT: Normocephalic, moist mucous membranes. Lungs: No audible wheezing or coughing. Heart: Regular rate and rhythm. Abdomen: Soft, nontender, no obvious distention. Musculoskeletal: No edema, cyanosis, or clubbing. Neuro: Alert, confused.  Cranial nerves grossly intact. Skin: No rashes or petechiae noted. Psych: Normal affect.   LAB RESULTS:  Lab Results  Component Value Date   NA 136 01/27/2020   K 4.7 01/27/2020   CL 100 01/27/2020  CO2 26 01/27/2020   GLUCOSE 86 01/27/2020   BUN 25 (H) 01/27/2020   CREATININE 1.43 (H) 01/27/2020   CALCIUM 8.9 01/27/2020   PROT 7.6 01/27/2020   ALBUMIN 3.9 01/27/2020   AST 26 01/27/2020   ALT 27 01/27/2020   ALKPHOS 64 01/27/2020   BILITOT 1.2 01/27/2020   GFRNONAA 46 (L) 01/27/2020   GFRAA 54 (L) 01/27/2020    Lab Results  Component Value Date   WBC 7.9 01/27/2020   NEUTROABS 6.0 01/27/2020   HGB 16.5 01/27/2020   HCT 48.4 01/27/2020   MCV 94.9 01/27/2020   PLT 108 (L) 01/27/2020     STUDIES: No results found.  ASSESSMENT: Invasive bladder cancer  PLAN:    1.  Invasive bladder cancer: Patient was diagnosed with a high-grade noninvasive bladder cancer on January 25, 2018.  He received 6 infusions of intravesicular gemcitabine at that time.  Subsequent cystoscopy revealed invasive disease.  Patient is not a candidate for cystectomy or systemic chemotherapy, but he may benefit from immunotherapy treatment using Tecentriq.  CT scan results from April 02, 2019 reviewed independently with no obvious metastatic disease outside the patient's bladder. He  was noted to have interval improvement of asymmetric bladder wall thickening.  Repeat cystoscopy on Sep 24, 2019 revealed a 5 mm area of malignancy, but otherwise was reported as normal. TURBT was not completed secondary to patient's ongoing dementia.  Proceed with cycle 19 of Tecentriq today.  Return to clinic in 3 weeks for further evaluation and consideration of cycle 20.  His next cystoscopy is in approximately November 2021.    2.  Thrombocytopenia: Chronic and unchanged.  Platelet count is 108. 3.  Elevated bilirubin: Resolved.  Possibly secondary to Tecentriq. 4.  Chronic renal insufficiency: Chronic and unchanged.  Patient's creatinine is 1.43 today. 5.  IV access: Patient declined port placement, but will consider one in the future if IV access becomes an issue.    Patient expressed understanding and was in agreement with this plan. He also understands that He can call clinic at any time with any questions, concerns, or complaints.     Lloyd Huger, MD   01/28/2020 10:37 AM

## 2020-01-26 ENCOUNTER — Encounter: Payer: Self-pay | Admitting: Oncology

## 2020-01-26 NOTE — Progress Notes (Signed)
Spoke to patient's wife due to patient's memory issues. She denies any concerns or questions at this time.

## 2020-01-27 ENCOUNTER — Inpatient Hospital Stay: Payer: Medicare Other

## 2020-01-27 ENCOUNTER — Other Ambulatory Visit: Payer: Self-pay

## 2020-01-27 ENCOUNTER — Inpatient Hospital Stay (HOSPITAL_BASED_OUTPATIENT_CLINIC_OR_DEPARTMENT_OTHER): Payer: Medicare Other | Admitting: Oncology

## 2020-01-27 VITALS — BP 123/78 | HR 73 | Temp 97.9°F | Resp 18 | Wt 212.1 lb

## 2020-01-27 DIAGNOSIS — Z5112 Encounter for antineoplastic immunotherapy: Secondary | ICD-10-CM | POA: Diagnosis not present

## 2020-01-27 DIAGNOSIS — C689 Malignant neoplasm of urinary organ, unspecified: Secondary | ICD-10-CM

## 2020-01-27 DIAGNOSIS — C679 Malignant neoplasm of bladder, unspecified: Secondary | ICD-10-CM

## 2020-01-27 LAB — CBC WITH DIFFERENTIAL/PLATELET
Abs Immature Granulocytes: 0.02 10*3/uL (ref 0.00–0.07)
Basophils Absolute: 0 10*3/uL (ref 0.0–0.1)
Basophils Relative: 1 %
Eosinophils Absolute: 0.2 10*3/uL (ref 0.0–0.5)
Eosinophils Relative: 2 %
HCT: 48.4 % (ref 39.0–52.0)
Hemoglobin: 16.5 g/dL (ref 13.0–17.0)
Immature Granulocytes: 0 %
Lymphocytes Relative: 13 %
Lymphs Abs: 1 10*3/uL (ref 0.7–4.0)
MCH: 32.4 pg (ref 26.0–34.0)
MCHC: 34.1 g/dL (ref 30.0–36.0)
MCV: 94.9 fL (ref 80.0–100.0)
Monocytes Absolute: 0.6 10*3/uL (ref 0.1–1.0)
Monocytes Relative: 8 %
Neutro Abs: 6 10*3/uL (ref 1.7–7.7)
Neutrophils Relative %: 76 %
Platelets: 108 10*3/uL — ABNORMAL LOW (ref 150–400)
RBC: 5.1 MIL/uL (ref 4.22–5.81)
RDW: 13 % (ref 11.5–15.5)
WBC: 7.9 10*3/uL (ref 4.0–10.5)
nRBC: 0 % (ref 0.0–0.2)

## 2020-01-27 LAB — COMPREHENSIVE METABOLIC PANEL
ALT: 27 U/L (ref 0–44)
AST: 26 U/L (ref 15–41)
Albumin: 3.9 g/dL (ref 3.5–5.0)
Alkaline Phosphatase: 64 U/L (ref 38–126)
Anion gap: 10 (ref 5–15)
BUN: 25 mg/dL — ABNORMAL HIGH (ref 8–23)
CO2: 26 mmol/L (ref 22–32)
Calcium: 8.9 mg/dL (ref 8.9–10.3)
Chloride: 100 mmol/L (ref 98–111)
Creatinine, Ser: 1.43 mg/dL — ABNORMAL HIGH (ref 0.61–1.24)
GFR calc Af Amer: 54 mL/min — ABNORMAL LOW (ref >60)
GFR calc non Af Amer: 46 mL/min — ABNORMAL LOW (ref >60)
Glucose, Bld: 86 mg/dL (ref 70–99)
Potassium: 4.7 mmol/L (ref 3.5–5.1)
Sodium: 136 mmol/L (ref 135–145)
Total Bilirubin: 1.2 mg/dL (ref 0.3–1.2)
Total Protein: 7.6 g/dL (ref 6.5–8.1)

## 2020-01-27 MED ORDER — SODIUM CHLORIDE 0.9 % IV SOLN
1200.0000 mg | Freq: Once | INTRAVENOUS | Status: AC
  Administered 2020-01-27: 1200 mg via INTRAVENOUS
  Filled 2020-01-27: qty 20

## 2020-01-27 MED ORDER — SODIUM CHLORIDE 0.9 % IV SOLN
Freq: Once | INTRAVENOUS | Status: AC
  Filled 2020-01-27: qty 250

## 2020-01-28 LAB — THYROID PANEL WITH TSH
Free Thyroxine Index: 2 (ref 1.2–4.9)
T3 Uptake Ratio: 30 % (ref 24–39)
T4, Total: 6.8 ug/dL (ref 4.5–12.0)
TSH: 2.17 u[IU]/mL (ref 0.450–4.500)

## 2020-02-13 NOTE — Progress Notes (Signed)
[name]  [phone] Fax:(336) [phone]  ID: Kyle Gregory OB: 1940/07/25  MR#: [id number]  NOB#:[id number]  Patient Care Team: Rusty Aus, MD as PCP - General (Internal Medicine)  CHIEF COMPLAINT: Invasive bladder cancer  INTERVAL HISTORY: Patient returns to clinic today for further evaluation and continuation of Tecentriq.  He currently feels well and is at his baseline. Given his underlying dementia, the entire history is given by his wife.  He has no neurologic complaints. He denies any recent fevers or illnesses.  He has a good appetite and denies weight loss.  He denies any chest pain, shortness of breath, cough, or hemoptysis.  He denies any nausea, vomiting, constipation, or diarrhea.  He has no urinary complaints.  He denies hematuria.  Patient offers no specific complaints today.  REVIEW OF SYSTEMS:   Review of Systems  Constitutional: Negative.  Negative for fever, malaise/fatigue and weight loss.  Respiratory: Negative.  Negative for cough and shortness of breath.   Cardiovascular: Negative.  Negative for chest pain and leg swelling.  Gastrointestinal: Negative.  Negative for abdominal pain.  Genitourinary: Negative.  Negative for dysuria and hematuria.  Musculoskeletal: Negative.  Negative for back pain.  Skin: Negative.  Negative for rash.  Neurological: Negative.  Negative for focal weakness, weakness and headaches.  Psychiatric/Behavioral: Positive for memory loss. The patient is not nervous/anxious.     As per HPI. Otherwise, a complete review of systems is negative.  PAST MEDICAL HISTORY: Past Medical History:  Diagnosis Date  . Anemia   . Aortic stenosis   . Arrhythmia   . Atrial fibrillation ([place])   . Bladder cancer ([place])   . Broken skin    buttocks  . Cardiomyopathy ([place])   . CHF (congestive heart failure) ([place])   . COPD (chronic obstructive pulmonary disease) ([place])   . Coronary artery disease   . Dementia ([place])     . Dysrhythmia   . Elevated lipids   . GERD (gastroesophageal reflux disease)   . Heartburn   . Lower extremity edema   . Presence of permanent cardiac pacemaker   . Stroke ([place])    x 2 after heart surgery  . VRE (vancomycin resistant enterococcus) culture positive     PAST SURGICAL HISTORY: Past Surgical History:  Procedure Laterality Date  . CARDIAC VALVE REPLACEMENT    . 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 CYSTOSCOPY W/ RETROGRADES Bilateral 08/14/2016   Procedure: CYSTOSCOPY WITH RETROGRADE PYELOGRAM;  Surgeon: Hollice Espy, MD;  Location: [facility];  Service: Urology;  Laterality: Bilateral;  . LEFT HEART CATH AND CORONARY ANGIOGRAPHY Left 09/05/2016   Procedure: Left Heart Cath and Coronary Angiography;  Surgeon: Teodoro Spray, MD;  Location: [facility];  Service: Cardiovascular;  Laterality: Left;  . neck growth removal     1970's  . PACEMAKER INSERTION N/A 06/24/2018   Procedure: INSERTION PACEMAKER;  Surgeon: Isaias Cowman, MD;  Location: [facility];  Service: Cardiovascular;  Laterality: N/A;  . TEMPORARY PACEMAKER N/A 06/22/2018   Procedure: TEMPORARY PACEMAKER;  Surgeon: Yolonda Kida, MD;  Location: [facility];  Service: Cardiovascular;  Laterality: N/A;  . TONSILLECTOMY    . TRANSURETHRAL RESECTION OF BLADDER TUMOR N/A 08/14/2016   Procedure: TRANSURETHRAL RESECTION OF BLADDER TUMOR (TURBT);  Surgeon: Hollice Espy, MD;  Location: [facility];  Service: Urology;  Laterality: N/A;  . TRANSURETHRAL RESECTION OF BLADDER TUMOR N/A 01/25/2018  Procedure: TRANSURETHRAL RESECTION OF BLADDER TUMOR (TURBT);  Surgeon: Billey Co, MD;  Location: [facility];  Service: Urology;  Laterality: N/A;  . TRANSURETHRAL RESECTION OF BLADDER TUMOR N/A 11/06/2018   Procedure: TRANSURETHRAL RESECTION OF BLADDER TUMOR (TURBT);  Surgeon: Billey Co, MD;  Location: [facility];  Service: Urology;  Laterality: N/A;     FAMILY HISTORY: Family History  Problem Relation Age of Onset  . Heart failure Father   . Heart failure Brother   . Atrial fibrillation Sister   . Prostate cancer Neg Hx   . Kidney cancer Neg Hx   . Bladder Cancer Neg Hx     ADVANCED DIRECTIVES (Y/N):  N  HEALTH MAINTENANCE: Social History   Tobacco Use  . Smoking status: Never Smoker  . Smokeless tobacco: Never Used  Substance Use Topics  . Alcohol use: No  . Drug use: No     Colonoscopy:  PAP:  Bone density:  Lipid panel:  Allergies  Allergen Reactions  . Amiodarone Other (See Comments)    Dizziness  . Donepezil Palpitations    Severe bradycardia    Current Outpatient Medications  Medication Sig Dispense Refill  . apixaban (ELIQUIS) 5 MG TABS tablet Take 1 tablet (5 mg total) by mouth 2 (two) times daily. 60 tablet 0  . Calcium Carb-Cholecalciferol (OYSTER SHELL CALCIUM) 500-400 MG-UNIT TABS Take by mouth.    . cyanocobalamin (,VITAMIN B-12,) 1000 MCG/ML injection Inject into the muscle.    . ferrous sulfate 325 (65 FE) MG tablet Take 325 mg by mouth 2 (two) times daily with a meal.    . FLUoxetine (PROZAC) 20 MG capsule Take 20 mg by mouth daily.     . Melatonin 3 MG TABS Take 3 mg by mouth at bedtime.     . mupirocin ointment (BACTROBAN) 2 %     . nitroGLYCERIN (NITROSTAT) 0.4 MG SL tablet Place 0.4 mg under the tongue every 5 (five) minutes as needed for chest pain (Up to 3 doses).     Marland Kitchen omeprazole (PRILOSEC) 20 MG capsule Take 20 mg by mouth daily as needed (Heartburn or acid reflux).     . pravastatin (PRAVACHOL) 40 MG tablet Take 40 mg by mouth at bedtime.   11  . spironolactone (ALDACTONE) 25 MG tablet Take 25 mg by mouth daily.     Marland Kitchen zinc oxide ([employer] ZINC OXIDE) 20 % ointment Apply 1 application topically 2 (two) times daily as needed for irritation.      No current facility-administered medications for this visit.   Facility-Administered Medications Ordered in Other Visits  Medication Dose Route  Frequency Provider Last Rate Last Admin  . 0.9 %  sodium chloride infusion   Intravenous Once Lloyd Huger, MD        OBJECTIVE: Vitals:   02/17/20 1331  BP: 116/82  Pulse: 75  Resp: 20  Temp: (!) 97.5 F (36.4 C)  SpO2: 100%     Body mass index is 28.74 kg/m.    ECOG FS:0 - Asymptomatic  General: Well-developed, well-nourished, no acute distress.  Sitting in a wheelchair. Eyes: Pink conjunctiva, anicteric sclera. HEENT: Normocephalic, moist mucous membranes. Lungs: No audible wheezing or coughing. Heart: Regular rate and rhythm. Abdomen: Soft, nontender, no obvious distention. Musculoskeletal: No edema, cyanosis, or clubbing. Neuro: Alert. Cranial nerves grossly intact. Skin: No rashes or petechiae noted. Psych: Normal affect.  LAB RESULTS:  Lab Results  Component Value Date   NA 136 02/17/2020   K 4.4  02/17/2020   CL 102 02/17/2020   CO2 26 02/17/2020   GLUCOSE 119 (H) 02/17/2020   BUN 23 02/17/2020   CREATININE 1.22 02/17/2020   CALCIUM 8.6 (L) 02/17/2020   PROT 7.7 02/17/2020   ALBUMIN 3.8 02/17/2020   AST 26 02/17/2020   ALT 26 02/17/2020   ALKPHOS 79 02/17/2020   BILITOT 1.6 (H) 02/17/2020   GFRNONAA 56 (L) 02/17/2020   GFRAA 54 (L) 01/27/2020    Lab Results  Component Value Date   WBC 8.3 02/17/2020   NEUTROABS 6.1 02/17/2020   HGB 16.2 02/17/2020   HCT 47.6 02/17/2020   MCV 95.4 02/17/2020   PLT 118 (L) 02/17/2020     STUDIES: No results found.  ASSESSMENT: Invasive bladder cancer  PLAN:    1.  Invasive bladder cancer: Patient was diagnosed with a high-grade noninvasive bladder cancer on January 25, 2018.  He received 6 infusions of intravesicular gemcitabine at that time.  Subsequent cystoscopy revealed invasive disease.  Patient is not a candidate for cystectomy or systemic chemotherapy, but he may benefit from immunotherapy treatment using Tecentriq.  CT scan results from April 02, 2019 reviewed independently with no obvious  metastatic disease outside the patient's bladder. He was noted to have interval improvement of asymmetric bladder wall thickening.  Repeat cystoscopy on Sep 24, 2019 revealed a 5 mm area of malignancy, but otherwise was reported as normal. TURBT was not completed secondary to patient's ongoing dementia.  Proceed with cycle 20 of Tecentriq today.  Return to clinic in 3 weeks for further evaluation and consideration of cycle 21.  His next cystoscopy is scheduled for March 31, 2020.   2.  Thrombocytopenia: Mildly improved.  Platelet count is 118 today. 3.  Elevated bilirubin: Mildly increased to 1.6.  Proceed with Tecentriq as above. 4.  Chronic renal insufficiency: Resolved.  Patient's creatinine is within normal limits today. 5.  IV access: Patient declined port placement, but will consider one in the future if IV access becomes an issue.    Patient expressed understanding and was in agreement with this plan. He also understands that He can call clinic at any time with any questions, concerns, or complaints.     Lloyd Huger, MD   02/17/2020 6:55 PM

## 2020-02-17 ENCOUNTER — Inpatient Hospital Stay: Payer: Medicare Other

## 2020-02-17 ENCOUNTER — Other Ambulatory Visit: Payer: Self-pay

## 2020-02-17 ENCOUNTER — Inpatient Hospital Stay: Payer: Medicare Other | Attending: Oncology

## 2020-02-17 ENCOUNTER — Inpatient Hospital Stay (HOSPITAL_BASED_OUTPATIENT_CLINIC_OR_DEPARTMENT_OTHER): Payer: Medicare Other | Admitting: Oncology

## 2020-02-17 VITALS — BP 116/82 | HR 75 | Temp 97.5°F | Resp 20 | Wt 211.9 lb

## 2020-02-17 DIAGNOSIS — Z23 Encounter for immunization: Secondary | ICD-10-CM | POA: Insufficient documentation

## 2020-02-17 DIAGNOSIS — C689 Malignant neoplasm of urinary organ, unspecified: Secondary | ICD-10-CM

## 2020-02-17 DIAGNOSIS — Z79899 Other long term (current) drug therapy: Secondary | ICD-10-CM | POA: Diagnosis not present

## 2020-02-17 DIAGNOSIS — C679 Malignant neoplasm of bladder, unspecified: Secondary | ICD-10-CM | POA: Insufficient documentation

## 2020-02-17 DIAGNOSIS — Z5112 Encounter for antineoplastic immunotherapy: Secondary | ICD-10-CM | POA: Diagnosis not present

## 2020-02-17 LAB — COMPREHENSIVE METABOLIC PANEL
ALT: 26 U/L (ref 0–44)
AST: 26 U/L (ref 15–41)
Albumin: 3.8 g/dL (ref 3.5–5.0)
Alkaline Phosphatase: 79 U/L (ref 38–126)
Anion gap: 8 (ref 5–15)
BUN: 23 mg/dL (ref 8–23)
CO2: 26 mmol/L (ref 22–32)
Calcium: 8.6 mg/dL — ABNORMAL LOW (ref 8.9–10.3)
Chloride: 102 mmol/L (ref 98–111)
Creatinine, Ser: 1.22 mg/dL (ref 0.61–1.24)
GFR, Estimated: 56 mL/min — ABNORMAL LOW (ref >60)
Glucose, Bld: 119 mg/dL — ABNORMAL HIGH (ref 70–99)
Potassium: 4.4 mmol/L (ref 3.5–5.1)
Sodium: 136 mmol/L (ref 135–145)
Total Bilirubin: 1.6 mg/dL — ABNORMAL HIGH (ref 0.3–1.2)
Total Protein: 7.7 g/dL (ref 6.5–8.1)

## 2020-02-17 LAB — CBC WITH DIFFERENTIAL/PLATELET
Abs Immature Granulocytes: 0.04 10*3/uL (ref 0.00–0.07)
Basophils Absolute: 0.1 10*3/uL (ref 0.0–0.1)
Basophils Relative: 1 %
Eosinophils Absolute: 0.4 10*3/uL (ref 0.0–0.5)
Eosinophils Relative: 4 %
HCT: 47.6 % (ref 39.0–52.0)
Hemoglobin: 16.2 g/dL (ref 13.0–17.0)
Immature Granulocytes: 1 %
Lymphocytes Relative: 13 %
Lymphs Abs: 1.1 10*3/uL (ref 0.7–4.0)
MCH: 32.5 pg (ref 26.0–34.0)
MCHC: 34 g/dL (ref 30.0–36.0)
MCV: 95.4 fL (ref 80.0–100.0)
Monocytes Absolute: 0.7 10*3/uL (ref 0.1–1.0)
Monocytes Relative: 8 %
Neutro Abs: 6.1 10*3/uL (ref 1.7–7.7)
Neutrophils Relative %: 73 %
Platelets: 118 10*3/uL — ABNORMAL LOW (ref 150–400)
RBC: 4.99 MIL/uL (ref 4.22–5.81)
RDW: 12.9 % (ref 11.5–15.5)
WBC: 8.3 10*3/uL (ref 4.0–10.5)
nRBC: 0 % (ref 0.0–0.2)

## 2020-02-17 MED ORDER — SODIUM CHLORIDE 0.9 % IV SOLN
Freq: Once | INTRAVENOUS | Status: AC
  Filled 2020-02-17: qty 250

## 2020-02-17 MED ORDER — INFLUENZA VAC A&B SA ADJ QUAD 0.5 ML IM PRSY
0.5000 mL | PREFILLED_SYRINGE | Freq: Once | INTRAMUSCULAR | Status: AC
  Administered 2020-02-17: 0.5 mL via INTRAMUSCULAR
  Filled 2020-02-17: qty 0.5

## 2020-02-17 MED ORDER — SODIUM CHLORIDE 0.9 % IV SOLN
1200.0000 mg | Freq: Once | INTRAVENOUS | Status: AC
  Administered 2020-02-17: 1200 mg via INTRAVENOUS
  Filled 2020-02-17: qty 20

## 2020-02-17 NOTE — Progress Notes (Signed)
Patient wife states she had to postpone patient's flu and b12 injection due to chemo treatment today. She would like to discuss with provider when a good time to have injections scheduled again. She denies other concerns at this time.

## 2020-02-17 NOTE — Progress Notes (Signed)
Per MD- ok to treat with bili 1.6 .

## 2020-02-18 LAB — THYROID PANEL WITH TSH
Free Thyroxine Index: 2 (ref 1.2–4.9)
T3 Uptake Ratio: 29 % (ref 24–39)
T4, Total: 6.8 ug/dL (ref 4.5–12.0)
TSH: 3.24 u[IU]/mL (ref 0.450–4.500)

## 2020-03-07 NOTE — Progress Notes (Signed)
[facility]  Telephone:(336) [phone] Fax:(336) [phone]  ID: Kyle Gregory OB: [birth year]  MR#: [id number]  DSK#:[id number]  Patient Care Team: Rusty Aus, MD as PCP - General (Internal Medicine) Lloyd Huger, MD as Consulting Physician (Hematology and Oncology)  CHIEF COMPLAINT: Invasive bladder cancer  INTERVAL HISTORY: Patient returns to clinic he continues to feel well and is at his baseline. Given his underlying dementia, the entire history is given by his wife.  He is starting physical therapy for increased weakness.  He has no neurologic complaints. He denies any recent fevers or illnesses.  He has a good appetite and denies weight loss.  He denies any chest pain, shortness of breath, cough, or hemoptysis.  He denies any nausea, vomiting, constipation, or diarrhea.  He has no urinary complaints.  He denies hematuria.  Patient or his wife offer no further specific complaints today.  REVIEW OF SYSTEMS:   Review of Systems  Constitutional: Negative.  Negative for fever, malaise/fatigue and weight loss.  Respiratory: Negative.  Negative for cough and shortness of breath.   Cardiovascular: Negative.  Negative for chest pain and leg swelling.  Gastrointestinal: Negative.  Negative for abdominal pain.  Genitourinary: Negative.  Negative for dysuria and hematuria.  Musculoskeletal: Negative.  Negative for back pain.  Skin: Negative.  Negative for rash.  Neurological: Positive for weakness. Negative for focal weakness and headaches.  Psychiatric/Behavioral: Positive for memory loss. The patient is not nervous/anxious.     As per HPI. Otherwise, a complete review of systems is negative.  PAST MEDICAL HISTORY: Past Medical History:  Diagnosis Date  . Anemia   . Aortic stenosis   . Arrhythmia   . Atrial fibrillation ([place])   . Bladder cancer ([place])   . Broken skin    buttocks  . Cardiomyopathy ([place])   . CHF (congestive heart failure) ([place])   . COPD  (chronic obstructive pulmonary disease) ([place])   . Coronary artery disease   . Dementia ([place])   . Dysrhythmia   . Elevated lipids   . GERD (gastroesophageal reflux disease)   . Heartburn   . Lower extremity edema   . Presence of permanent cardiac pacemaker   . Stroke ([place])    x 2 after heart surgery  . VRE (vancomycin resistant enterococcus) culture positive     PAST SURGICAL HISTORY: Past Surgical History:  Procedure Laterality Date  . CARDIAC VALVE REPLACEMENT    . 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 CYSTOSCOPY W/ RETROGRADES Bilateral 08/14/2016   Procedure: CYSTOSCOPY WITH RETROGRADE PYELOGRAM;  Surgeon: Hollice Espy, MD;  Location: [facility];  Service: Urology;  Laterality: Bilateral;  . LEFT HEART CATH AND CORONARY ANGIOGRAPHY Left 09/05/2016   Procedure: Left Heart Cath and Coronary Angiography;  Surgeon: Teodoro Spray, MD;  Location: [facility];  Service: Cardiovascular;  Laterality: Left;  . neck growth removal     1970's  . PACEMAKER INSERTION N/A 06/24/2018   Procedure: INSERTION PACEMAKER;  Surgeon: Isaias Cowman, MD;  Location: [facility];  Service: Cardiovascular;  Laterality: N/A;  . TEMPORARY PACEMAKER N/A 06/22/2018   Procedure: TEMPORARY PACEMAKER;  Surgeon: Yolonda Kida, MD;  Location: [facility];  Service: Cardiovascular;  Laterality: N/A;  . TONSILLECTOMY    . TRANSURETHRAL RESECTION OF BLADDER TUMOR N/A 08/14/2016   Procedure: TRANSURETHRAL RESECTION OF BLADDER TUMOR (TURBT);  Surgeon: Hollice Espy, MD;  Location: [facility];  Service: Urology;  Laterality: N/A;  . TRANSURETHRAL RESECTION OF BLADDER TUMOR N/A 01/25/2018   Procedure: TRANSURETHRAL RESECTION OF BLADDER TUMOR (TURBT);  Surgeon: Billey Co, MD;  Location: [facility];  Service: Urology;  Laterality: N/A;  . TRANSURETHRAL RESECTION OF BLADDER TUMOR N/A 11/06/2018   Procedure: TRANSURETHRAL RESECTION OF BLADDER TUMOR (TURBT);   Surgeon: Billey Co, MD;  Location: [facility];  Service: Urology;  Laterality: N/A;    FAMILY HISTORY: Family History  Problem Relation Age of Onset  . Heart failure Father   . Heart failure Brother   . Atrial fibrillation Sister   . Prostate cancer Neg Hx   . Kidney cancer Neg Hx   . Bladder Cancer Neg Hx     ADVANCED DIRECTIVES (Y/N):  N  HEALTH MAINTENANCE: Social History   Tobacco Use  . Smoking status: Never Smoker  . Smokeless tobacco: Never Used  Substance Use Topics  . Alcohol use: No  . Drug use: No     Colonoscopy:  PAP:  Bone density:  Lipid panel:  Allergies  Allergen Reactions  . Amiodarone Other (See Comments)    Dizziness  . Donepezil Palpitations    Severe bradycardia    Current Outpatient Medications  Medication Sig Dispense Refill  . apixaban (ELIQUIS) 5 MG TABS tablet Take 1 tablet (5 mg total) by mouth 2 (two) times daily. 60 tablet 0  . Calcium Carb-Cholecalciferol (OYSTER SHELL CALCIUM) 500-400 MG-UNIT TABS Take by mouth.    . cyanocobalamin (,VITAMIN B-12,) 1000 MCG/ML injection Inject into the muscle.    . ferrous sulfate 325 (65 FE) MG tablet Take 325 mg by mouth 2 (two) times daily with a meal.    . FLUoxetine (PROZAC) 20 MG capsule Take 20 mg by mouth daily.     . Melatonin 3 MG TABS Take 3 mg by mouth at bedtime.     . mupirocin ointment (BACTROBAN) 2 %     . omeprazole (PRILOSEC) 20 MG capsule Take 20 mg by mouth daily as needed (Heartburn or acid reflux).     . pravastatin (PRAVACHOL) 40 MG tablet Take 40 mg by mouth at bedtime.   11  . spironolactone (ALDACTONE) 25 MG tablet Take 25 mg by mouth daily.     Marland Kitchen zinc oxide ([employer] ZINC OXIDE) 20 % ointment Apply 1 application topically 2 (two) times daily as needed for irritation.     . nitroGLYCERIN (NITROSTAT) 0.4 MG SL tablet Place 0.4 mg under the tongue every 5 (five) minutes as needed for chest pain (Up to 3 doses).      No current facility-administered medications for this  visit.   Facility-Administered Medications Ordered in Other Visits  Medication Dose Route Frequency Provider Last Rate Last Admin  . 0.9 %  sodium chloride infusion   Intravenous Once Lloyd Huger, MD        OBJECTIVE: Vitals:   03/09/20 1322  BP: 117/76  Pulse: 77  Resp: 20  Temp: 97.8 F (36.6 C)  SpO2: 100%     Body mass index is 28.74 kg/m.    ECOG FS:0 - Asymptomatic  General: Well-developed, well-nourished, no acute distress.  Sitting in a wheelchair. Eyes: Pink conjunctiva, anicteric sclera. HEENT: Normocephalic, moist mucous membranes. Lungs: No audible wheezing or coughing. Heart: Regular rate and rhythm. Abdomen: Soft, nontender, no obvious distention. Musculoskeletal: No edema, cyanosis, or clubbing. Neuro: Alert, answering all questions appropriately. Cranial nerves grossly intact. Skin: No rashes or petechiae noted. Psych: Normal affect.   LAB  RESULTS:  Lab Results  Component Value Date   NA 136 03/09/2020   K 4.7 03/09/2020   CL 101 03/09/2020   CO2 27 03/09/2020   GLUCOSE 152 (H) 03/09/2020   BUN 27 (H) 03/09/2020   CREATININE 1.37 (H) 03/09/2020   CALCIUM 9.3 03/09/2020   PROT 8.0 03/09/2020   ALBUMIN 4.1 03/09/2020   AST 28 03/09/2020   ALT 24 03/09/2020   ALKPHOS 74 03/09/2020   BILITOT 1.3 (H) 03/09/2020   GFRNONAA 52 (L) 03/09/2020   GFRAA 54 (L) 01/27/2020    Lab Results  Component Value Date   WBC 7.6 03/09/2020   NEUTROABS 5.7 03/09/2020   HGB 15.4 03/09/2020   HCT 45.7 03/09/2020   MCV 96.2 03/09/2020   PLT 117 (L) 03/09/2020     STUDIES: No results found.  ASSESSMENT: Invasive bladder cancer  PLAN:    1.  Invasive bladder cancer: Patient was diagnosed with a high-grade noninvasive bladder cancer on January 25, 2018.  He received 6 infusions of intravesicular gemcitabine at that time.  Subsequent cystoscopy revealed invasive disease.  Patient is not a candidate for cystectomy or systemic chemotherapy, but he may  benefit from immunotherapy treatment using Tecentriq.  CT scan results from April 02, 2019 reviewed independently with no obvious metastatic disease outside the patient's bladder. He was noted to have interval improvement of asymmetric bladder wall thickening.  Repeat cystoscopy on Sep 24, 2019 revealed a 5 mm area of malignancy, but otherwise was reported as normal. TURBT was not completed secondary to patient's ongoing dementia.  Proceed with cycle 21 of Tecentriq today.  Return to clinic in 4 weeks after his cystoscopy for further evaluation and consideration of additional treatment. His next cystoscopy is scheduled for March 31, 2020.   2.  Thrombocytopenia: Chronic and unchanged.  Patient platelet count was 117 today. 3.  Elevated bilirubin: Improved to 1.3.  Proceed with Tecentriq as above. 4.  Chronic renal insufficiency: Creatinine slightly worse today at 1.37, but this is essentially his baseline.  Monitor. 5.  IV access: Patient declined port placement, but will consider one in the future if IV access becomes an issue.    Patient expressed understanding and was in agreement with this plan. He also understands that He can call clinic at any time with any questions, concerns, or complaints.     Lloyd Huger, MD   03/09/2020 3:22 PM

## 2020-03-09 ENCOUNTER — Encounter: Payer: Self-pay | Admitting: Oncology

## 2020-03-09 ENCOUNTER — Inpatient Hospital Stay: Payer: Medicare Other | Attending: Oncology

## 2020-03-09 ENCOUNTER — Inpatient Hospital Stay (HOSPITAL_BASED_OUTPATIENT_CLINIC_OR_DEPARTMENT_OTHER): Payer: Medicare Other | Admitting: Oncology

## 2020-03-09 ENCOUNTER — Inpatient Hospital Stay: Payer: Medicare Other

## 2020-03-09 VITALS — BP 117/76 | HR 77 | Temp 97.8°F | Resp 20 | Wt 211.9 lb

## 2020-03-09 VITALS — BP 111/82 | HR 70 | Temp 97.7°F | Resp 16

## 2020-03-09 DIAGNOSIS — Z5112 Encounter for antineoplastic immunotherapy: Secondary | ICD-10-CM | POA: Insufficient documentation

## 2020-03-09 DIAGNOSIS — C679 Malignant neoplasm of bladder, unspecified: Secondary | ICD-10-CM | POA: Insufficient documentation

## 2020-03-09 DIAGNOSIS — Z79899 Other long term (current) drug therapy: Secondary | ICD-10-CM | POA: Diagnosis not present

## 2020-03-09 DIAGNOSIS — C689 Malignant neoplasm of urinary organ, unspecified: Secondary | ICD-10-CM

## 2020-03-09 LAB — CBC WITH DIFFERENTIAL/PLATELET
Abs Immature Granulocytes: 0.01 10*3/uL (ref 0.00–0.07)
Basophils Absolute: 0.1 10*3/uL (ref 0.0–0.1)
Basophils Relative: 1 %
Eosinophils Absolute: 0.4 10*3/uL (ref 0.0–0.5)
Eosinophils Relative: 6 %
HCT: 45.7 % (ref 39.0–52.0)
Hemoglobin: 15.4 g/dL (ref 13.0–17.0)
Immature Granulocytes: 0 %
Lymphocytes Relative: 13 %
Lymphs Abs: 1 10*3/uL (ref 0.7–4.0)
MCH: 32.4 pg (ref 26.0–34.0)
MCHC: 33.7 g/dL (ref 30.0–36.0)
MCV: 96.2 fL (ref 80.0–100.0)
Monocytes Absolute: 0.5 10*3/uL (ref 0.1–1.0)
Monocytes Relative: 7 %
Neutro Abs: 5.7 10*3/uL (ref 1.7–7.7)
Neutrophils Relative %: 73 %
Platelets: 117 10*3/uL — ABNORMAL LOW (ref 150–400)
RBC: 4.75 MIL/uL (ref 4.22–5.81)
RDW: 12.9 % (ref 11.5–15.5)
WBC: 7.6 10*3/uL (ref 4.0–10.5)
nRBC: 0 % (ref 0.0–0.2)

## 2020-03-09 LAB — COMPREHENSIVE METABOLIC PANEL
ALT: 24 U/L (ref 0–44)
AST: 28 U/L (ref 15–41)
Albumin: 4.1 g/dL (ref 3.5–5.0)
Alkaline Phosphatase: 74 U/L (ref 38–126)
Anion gap: 8 (ref 5–15)
BUN: 27 mg/dL — ABNORMAL HIGH (ref 8–23)
CO2: 27 mmol/L (ref 22–32)
Calcium: 9.3 mg/dL (ref 8.9–10.3)
Chloride: 101 mmol/L (ref 98–111)
Creatinine, Ser: 1.37 mg/dL — ABNORMAL HIGH (ref 0.61–1.24)
GFR, Estimated: 52 mL/min — ABNORMAL LOW (ref >60)
Glucose, Bld: 152 mg/dL — ABNORMAL HIGH (ref 70–99)
Potassium: 4.7 mmol/L (ref 3.5–5.1)
Sodium: 136 mmol/L (ref 135–145)
Total Bilirubin: 1.3 mg/dL — ABNORMAL HIGH (ref 0.3–1.2)
Total Protein: 8 g/dL (ref 6.5–8.1)

## 2020-03-09 MED ORDER — SODIUM CHLORIDE 0.9 % IV SOLN
Freq: Once | INTRAVENOUS | Status: AC
  Filled 2020-03-09: qty 250

## 2020-03-09 MED ORDER — SODIUM CHLORIDE 0.9 % IV SOLN
1200.0000 mg | Freq: Once | INTRAVENOUS | Status: AC
  Administered 2020-03-09: 1200 mg via INTRAVENOUS
  Filled 2020-03-09: qty 20

## 2020-03-09 NOTE — Progress Notes (Signed)
Mr. Hutmacher completed his treatment today without any complications.

## 2020-03-10 LAB — THYROID PANEL WITH TSH
Free Thyroxine Index: 2.6 (ref 1.2–4.9)
T3 Uptake Ratio: 32 % (ref 24–39)
T4, Total: 8.2 ug/dL (ref 4.5–12.0)
TSH: 2.62 u[IU]/mL (ref 0.450–4.500)

## 2020-03-22 ENCOUNTER — Telehealth: Payer: Self-pay | Admitting: *Deleted

## 2020-03-22 NOTE — Telephone Encounter (Signed)
Wife calling as a FYI to Dr. Diamantina Providence that the family has decided that the pt will no longer receive treatments.

## 2020-03-31 ENCOUNTER — Other Ambulatory Visit: Payer: Self-pay | Admitting: Urology

## 2020-04-05 ENCOUNTER — Ambulatory Visit: Payer: Medicare Other | Admitting: Podiatry

## 2020-04-06 ENCOUNTER — Other Ambulatory Visit: Payer: Medicare Other

## 2020-04-06 ENCOUNTER — Ambulatory Visit: Payer: Medicare Other

## 2020-04-06 ENCOUNTER — Ambulatory Visit: Payer: Medicare Other | Admitting: Oncology

## 2020-04-08 ENCOUNTER — Ambulatory Visit: Payer: Medicare Other | Admitting: Podiatry

## 2020-08-04 IMAGING — CT CT ABD-PELV W/ CM
2 of 5 series · 16 of 46 positions shown, 18 images · IV contrast (omnipaque)
Comparison: 11/28/2018

CLINICAL DATA: Restaging bladder cancer.

EXAM:
CT ABDOMEN AND PELVIS WITH CONTRAST
TECHNIQUE: Multidetector CT imaging of the abdomen and pelvis was performed
using the standard protocol following bolus administration of
intravenous contrast.
CONTRAST:  75mL OMNIPAQUE IOHEXOL 300 MG/ML  SOLN

[Series 3: abd pelvis 5.00 · axial · 0.86mm/px · z∈[-1516,-1046]mm · 13 of 106 slices shown, 15 images]
[im 6/106  soft-tissue]
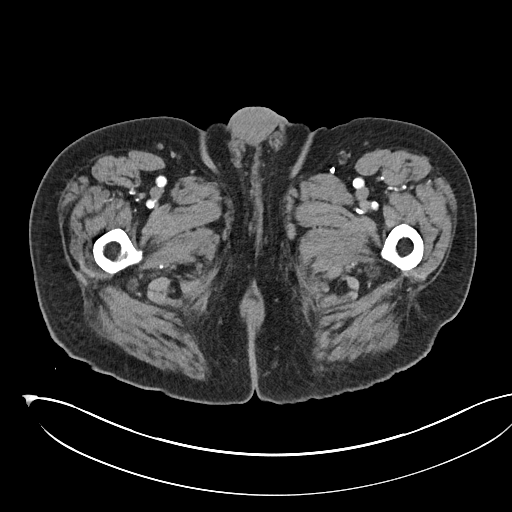
[im 6/106  bone]
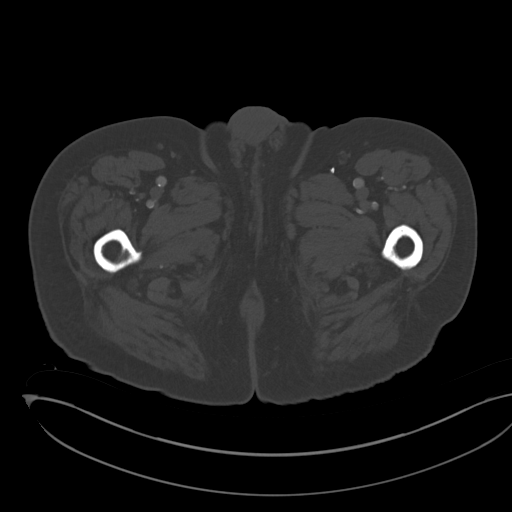
[im 12/106  soft-tissue]
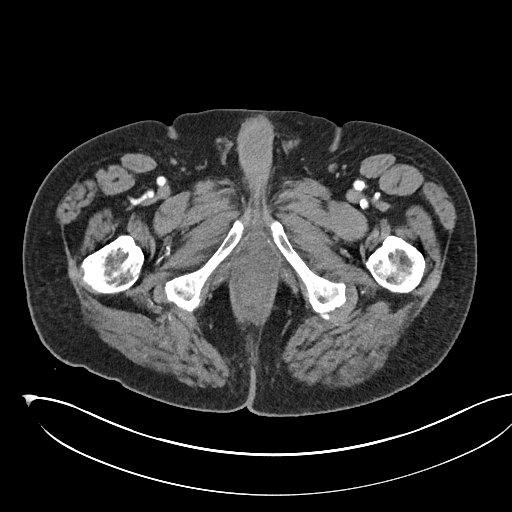
[im 24/106  soft-tissue]
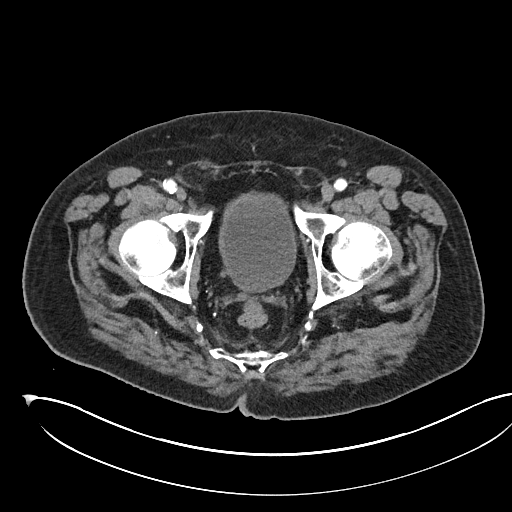
[im 30/106  soft-tissue]
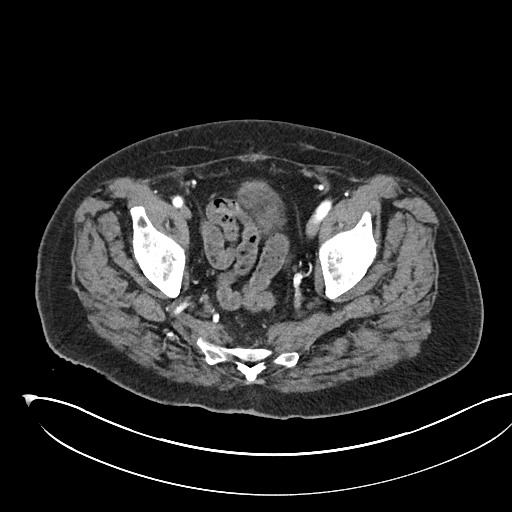
[im 36/106  soft-tissue]
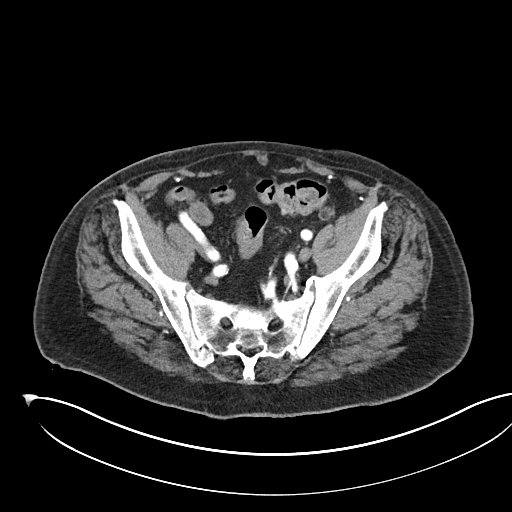
[im 47/106  soft-tissue]
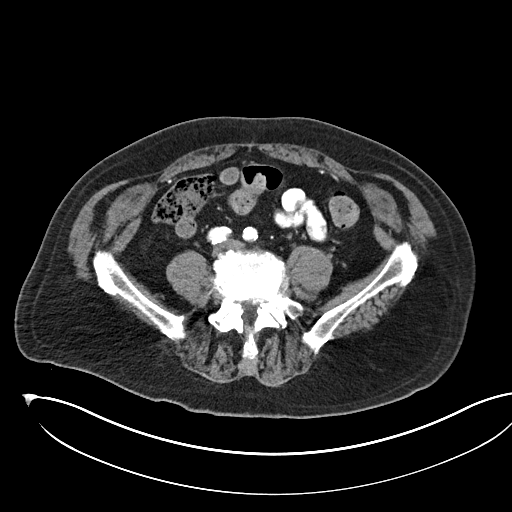
[im 53/106  soft-tissue]
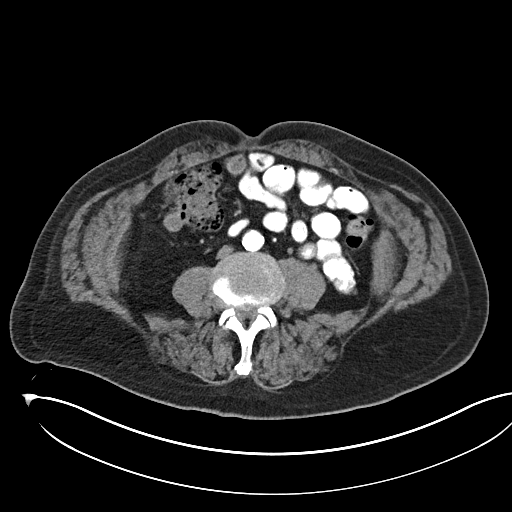
[im 59/106  soft-tissue]
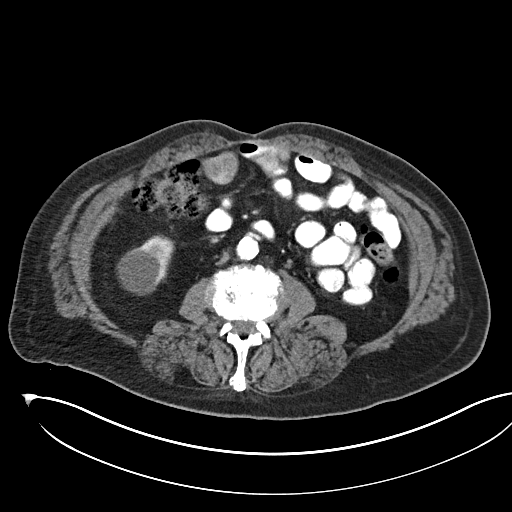
[im 71/106  soft-tissue]
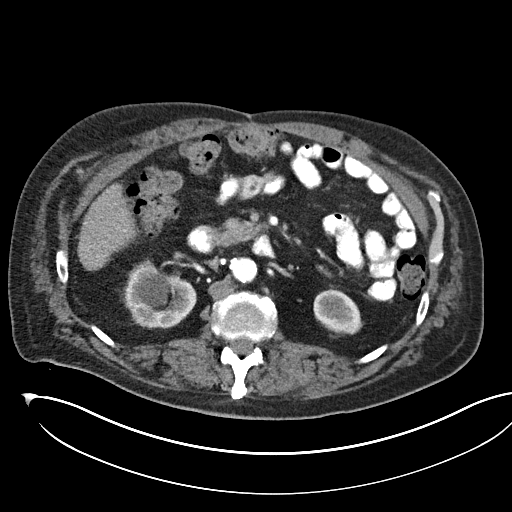
[im 71/106  bone]
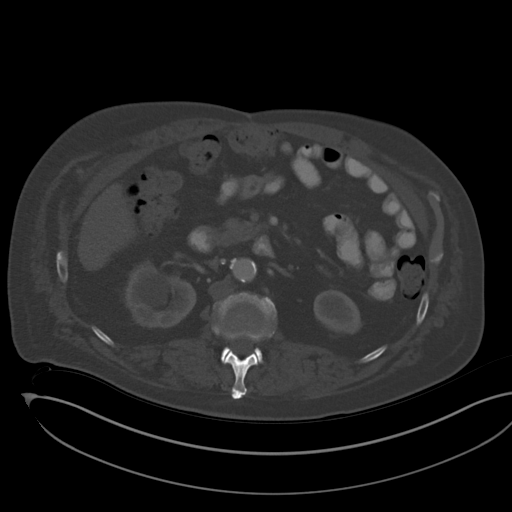
[im 76/106  soft-tissue]
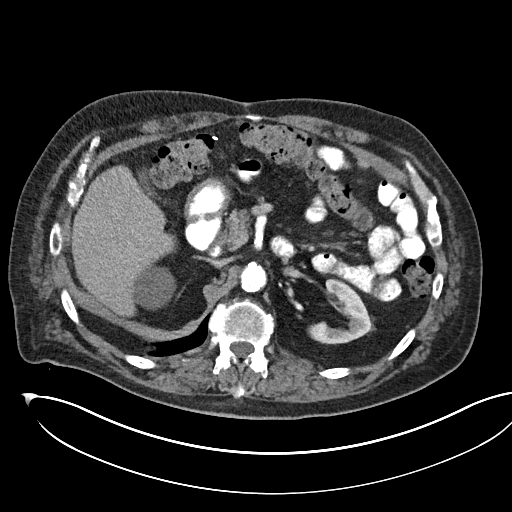
[im 82/106  soft-tissue]
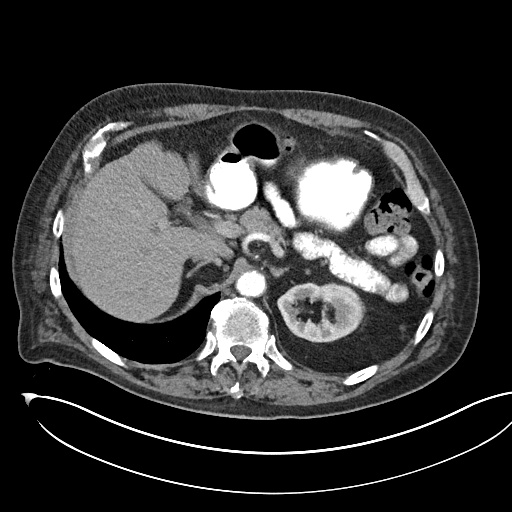
[im 94/106  soft-tissue]
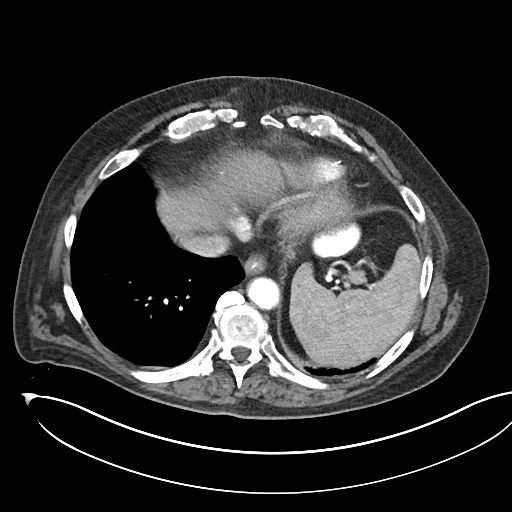
[im 100/106  soft-tissue]
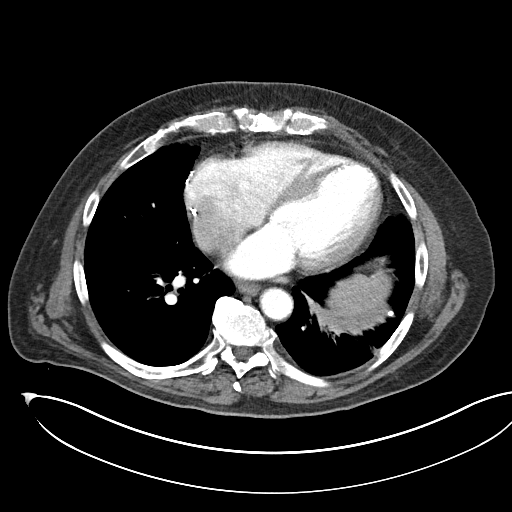

[Series 5: coronals abd pelvis 2.00 cor · coronal · 0.85mm/px · 3 of 151 slices shown]
[im 51/151  soft-tissue]
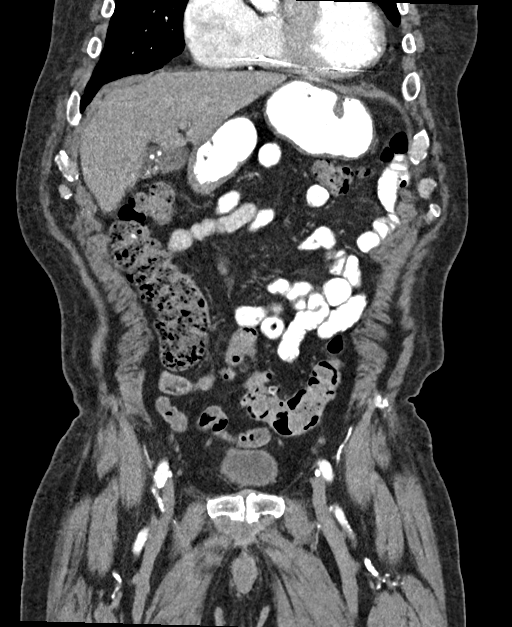
[im 67/151  soft-tissue]
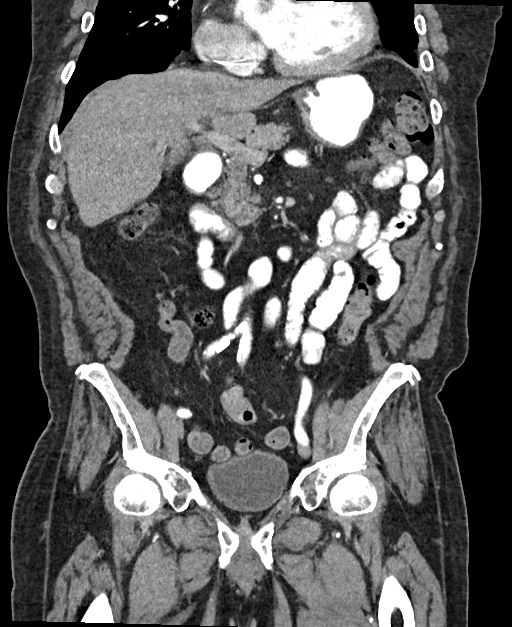
[im 84/151  soft-tissue]
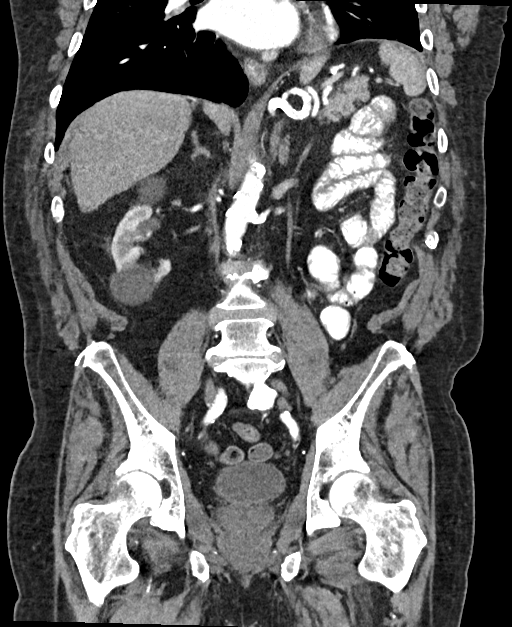

[16 of 46 positions shown; findings below may reference images not displayed]

FINDINGS: Lower chest: Chronic scarring and volume loss in the left lung base
is again noted, unchanged. No acute abnormality. 2 mm nodule in the
right lower lobe is unchanged, image [DATE].

Hepatobiliary: No focal liver abnormality identified. Gallstones are
identified measuring up to 7 mm. No gallbladder wall inflammation or
biliary dilatation.

Pancreas: Unremarkable. No pancreatic ductal dilatation or
surrounding inflammatory changes.

Spleen: Normal in size without focal abnormality.

Adrenals/Urinary Tract: Normal appearance of the adrenal glands.
Bilateral kidney cysts are again noted. The largest cyst within the
right kidney is in the inferior pole measuring 4.2 cm. The largest
left kidney cyst is also in the inferior pole measuring 4.4 cm. No
mass or hydronephrosis identified bilaterally. Previously noted
bilateral areas of bladder wall thickening and enhancement have
improved in the interval. No new bladder abnormality.

Stomach/Bowel: Stomach is within normal limits. Appendix appears
normal. No evidence of bowel wall thickening, distention, or
inflammatory changes. Colonic scratch set distal colonic diverticula
noted without acute inflammation.

Vascular/Lymphatic: Aortic atherosclerosis. No abdominopelvic
adenopathy identified.

Reproductive: Prostate is unremarkable.

Other: No free fluid or fluid collections.

Musculoskeletal: No acute or significant osseous findings.
Spondylosis identified within the lumbar spine. This is most
advanced at L2-3 and L3-4
IMPRESSION: 1. No acute findings and no specific findings identified to suggest
recurrent tumor or metastatic disease. Interval improvement in
previously noted asymmetric wall thickening and mucosal enhancement
involving bilateral bladder wall.
2. Aortic atherosclerosis.
3. Gallstones.
4. Bilateral kidney cysts.

Aortic Atherosclerosis ([id number]-ZS8.8).

## 2020-11-20 ENCOUNTER — Encounter: Payer: Self-pay | Admitting: Emergency Medicine

## 2020-11-20 ENCOUNTER — Other Ambulatory Visit: Payer: Self-pay

## 2020-11-20 ENCOUNTER — Emergency Department
Admission: EM | Admit: 2020-11-20 | Discharge: 2020-11-20 | Disposition: A | Discharge status: Home / Self Care | Payer: Medicare Other | Attending: Student in an Organized Health Care Education/Training Program | Admitting: Student in an Organized Health Care Education/Training Program

## 2020-11-20 DIAGNOSIS — Z8551 Personal history of malignant neoplasm of bladder: Secondary | ICD-10-CM | POA: Insufficient documentation

## 2020-11-20 DIAGNOSIS — I5022 Chronic systolic (congestive) heart failure: Secondary | ICD-10-CM | POA: Diagnosis not present

## 2020-11-20 DIAGNOSIS — J449 Chronic obstructive pulmonary disease, unspecified: Secondary | ICD-10-CM | POA: Diagnosis not present

## 2020-11-20 DIAGNOSIS — Z950 Presence of cardiac pacemaker: Secondary | ICD-10-CM | POA: Insufficient documentation

## 2020-11-20 DIAGNOSIS — I482 Chronic atrial fibrillation, unspecified: Secondary | ICD-10-CM | POA: Insufficient documentation

## 2020-11-20 DIAGNOSIS — I2510 Atherosclerotic heart disease of native coronary artery without angina pectoris: Secondary | ICD-10-CM | POA: Insufficient documentation

## 2020-11-20 DIAGNOSIS — Z951 Presence of aortocoronary bypass graft: Secondary | ICD-10-CM | POA: Diagnosis not present

## 2020-11-20 DIAGNOSIS — Z7901 Long term (current) use of anticoagulants: Secondary | ICD-10-CM | POA: Diagnosis not present

## 2020-11-20 DIAGNOSIS — F0390 Unspecified dementia without behavioral disturbance: Secondary | ICD-10-CM | POA: Insufficient documentation

## 2020-11-20 DIAGNOSIS — R040 Epistaxis: Secondary | ICD-10-CM | POA: Insufficient documentation

## 2020-11-20 LAB — CBC
HCT: 41.7 % (ref 39.0–52.0)
Hemoglobin: 14 g/dL (ref 13.0–17.0)
MCH: 32.1 pg (ref 26.0–34.0)
MCHC: 33.6 g/dL (ref 30.0–36.0)
MCV: 95.6 fL (ref 80.0–100.0)
Platelets: 128 10*3/uL — ABNORMAL LOW (ref 150–400)
RBC: 4.36 MIL/uL (ref 4.22–5.81)
RDW: 13.8 % (ref 11.5–15.5)
WBC: 11.6 10*3/uL — ABNORMAL HIGH (ref 4.0–10.5)
nRBC: 0 % (ref 0.0–0.2)

## 2020-11-20 LAB — PROTIME-INR
INR: 1.3 — ABNORMAL HIGH (ref 0.8–1.2)
Prothrombin Time: 15.7 seconds — ABNORMAL HIGH (ref 11.4–15.2)

## 2020-11-20 MED ORDER — TRANEXAMIC ACID FOR EPISTAXIS
500.0000 mg | Freq: Once | TOPICAL | Status: AC
  Administered 2020-11-20: 500 mg via TOPICAL
  Filled 2020-11-20: qty 10

## 2020-11-20 NOTE — Discharge Instructions (Addendum)
We have managed her nosebleed in the ED.  No sign of any serious injury to the nose.  Avoid picking and rubbing of the nose.  Follow-up with [facility] for any ongoing symptoms.

## 2020-11-20 NOTE — ED Triage Notes (Signed)
Pt in via EMS from [facility]. Facility reports pt with nose bleeds last pm and then 2 of them this am. Pt has dementia and is not ambulatory.

## 2020-11-20 NOTE — ED Provider Notes (Signed)
Emergency Medicine Provider Triage Evaluation Note  Kyle Gregory , a 80 y.o. male  was evaluated in triage.  Pt complains of nose bleed x 5 since last night.No active bleeding.  Review of Systems   Level 5 Caveat: dementia   Physical Exam  BP 93/60 (BP Location: Right Arm)   Pulse 68   Temp 98.2 F (36.8 C) (Oral)   Resp 18   Ht 6' (1.829 m)   Wt 90.7 kg   SpO2 98%   BMI 27.12 kg/m  Gen:   Awake, no distress   Resp:  Normal effort  MSK:   Moves extremities without difficulty  Other:    Medical Decision Making  Medically screening exam initiated at 1:50 PM.  Appropriate orders placed.  Kyle Gregory was informed that the remainder of the evaluation will be completed by another provider, this initial triage assessment does not replace that evaluation, and the importance of remaining in the ED until their evaluation is complete.    Kyle Dike, FNP 11/20/20 1353    Kyle Starch, MD 11/20/20 [phone]

## 2020-11-20 NOTE — ED Provider Notes (Signed)
[facility] Emergency Department Provider Note  ____________________________________________   Event Date/Time   First MD Initiated Contact with Patient 11/20/20 1629     (approximate)  I have reviewed the triage vital signs and the nursing notes.   HISTORY  Chief Complaint Epistaxis  HPI Kyle Gregory is a 80 y.o. male with the below medical history including CHF, COPD, coronary artery disease, and A. fib on Eliquis, presents for right-sided nosebleed.  Patient presents via EMS from [facility], for evaluation of persistent nosebleed.  Report comes with patient had 3 light nosebleed last night, and 2 nosebleeds spontaneously this morning.  He is not actively bleeding at time of triage intake.  Patient at baseline is demented, and nonambulatory.  Reported injury, trauma, facial trauma, or falls.     Past Medical History:  Diagnosis Date   Anemia    Aortic stenosis    Arrhythmia    Atrial fibrillation (HCC)    Bladder cancer (HCC)    Broken skin    buttocks   Cardiomyopathy ([place])    CHF (congestive heart failure) (HCC)    COPD (chronic obstructive pulmonary disease) (HCC)    Coronary artery disease    Dementia (HCC)    Dysrhythmia    Elevated lipids    GERD (gastroesophageal reflux disease)    Heartburn    Lower extremity edema    Presence of permanent cardiac pacemaker    Stroke ([place])    x 2 after heart surgery   VRE (vancomycin resistant enterococcus) culture positive     Patient Active Problem List   Diagnosis Date Noted   Thrombocytopenia ([place]) 09/02/2019   Coagulation disorder ([place]) 07/31/2019   Pain due to onychomycosis of toenails of both feet 01/30/2019   Goals of care, counseling/discussion 11/22/2018   Status post placement of cardiac pacemaker 07/08/2018   Bladder cancer ([place]) 03/10/2018   Bradycardia 11/10/2017   Vascular dementia without behavioral disturbance ([place]) 10/09/2017   Syncope 09/03/2017   Pressure injury of  skin 09/03/2017   Medicare annual wellness visit, initial 0000000   Chronic systolic heart failure ([place]) 05/04/2017   Lymphedema 05/04/2017   MSSA (methicillin susceptible Staphylococcus aureus) infection 10/28/2016   Stroke, acute, embolic ([place]) [id number]   S/P aortic valve replacement with bioprosthetic valve 10/01/2016   Morbid obesity with body mass index of 40.0-49.9 ([place]) 07/12/2016   Aortic stenosis, mild 07/11/2016   Cardiomyopathy, 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   Aortic atherosclerosis ([place]) 06/12/2016   B12 deficiency 06/12/2016   Iron deficiency anemia due to chronic blood loss 06/12/2016   Atrial fibrillation, chronic ([place]) 06/09/2016    Past Surgical History:  Procedure Laterality Date   CARDIAC VALVE REPLACEMENT     CORONARY ARTERY BYPASS GRAFT     triple bypass with aortic valve replacement   CYSTOSCOPY W/ RETROGRADES Bilateral 08/14/2016   Procedure: CYSTOSCOPY WITH RETROGRADE PYELOGRAM;  Surgeon: Hollice Espy, MD;  Location: [facility] OR[facility];  Service: Urology;  Laterality: Bilateral;   LEFT HEART CATH AND CORONARY ANGIOGRAPHY Left 09/05/2016   Procedure: Left Heart Cath and Coronary Angiography;  Surgeon: Teodoro Spray, MD;  Location: [place] [facility];  Service: Cardiovascular;  Laterality: Left;   neck growth removal     1970's   PACEMAKER INSERTION N/A 06/24/2018   Procedure: INSERTION PACEMAKER;  Surgeon: Isaias Cowman, MD;  Location: [facility];  Service: Cardiovascular;  Laterality: N/A;   TEMPORARY PACEMAKER N/A 06/22/2018   Procedure: TEMPORARY PACEMAKER;  Surgeon: Yolonda Kida, MD;  Location: [place] [facility];  Service: Cardiovascular;  Laterality: N/A;   TONSILLECTOMY     TRANSURETHRAL RESECTION OF BLADDER TUMOR N/A 08/14/2016   Procedure: TRANSURETHRAL RESECTION OF BLADDER TUMOR (TURBT);  Surgeon: Hollice Espy, MD;  Location: [facility];  Service: Urology;  Laterality: N/A;   TRANSURETHRAL RESECTION OF BLADDER TUMOR N/A 01/25/2018   Procedure:  TRANSURETHRAL RESECTION OF BLADDER TUMOR (TURBT);  Surgeon: Billey Co, MD;  Location: [facility];  Service: Urology;  Laterality: N/A;   TRANSURETHRAL RESECTION OF BLADDER TUMOR N/A 11/06/2018   Procedure: TRANSURETHRAL RESECTION OF BLADDER TUMOR (TURBT);  Surgeon: Billey Co, MD;  Location: [facility];  Service: Urology;  Laterality: N/A;    Prior to Admission medications   Medication Sig Start Date End Date Taking? Authorizing Provider  apixaban (ELIQUIS) 5 MG TABS tablet Take 1 tablet (5 mg total) by mouth 2 (two) times daily. 06/26/18   Bettey Costa, MD  Calcium Carb-Cholecalciferol (OYSTER SHELL CALCIUM) 500-400 MG-UNIT TABS Take by mouth.    [provider]  cyanocobalamin (,VITAMIN B-12,) 1000 MCG/ML injection Inject into the muscle. 09/16/19   [provider]  ferrous sulfate 325 (65 FE) MG tablet Take 325 mg by mouth 2 (two) times daily with a meal.    [provider]  FLUoxetine (PROZAC) 20 MG capsule Take 20 mg by mouth daily.     [provider]  Melatonin 3 MG TABS Take 3 mg by mouth at bedtime.     [provider]  mupirocin ointment (BACTROBAN) 2 %  09/02/19   [provider]  nitroGLYCERIN (NITROSTAT) 0.4 MG SL tablet Place 0.4 mg under the tongue every 5 (five) minutes as needed for chest pain (Up to 3 doses).  02/22/17 02/17/20  [provider]  omeprazole (PRILOSEC) 20 MG capsule Take 20 mg by mouth daily as needed (Heartburn or acid reflux).     [provider]  pravastatin (PRAVACHOL) 40 MG tablet Take 40 mg by mouth at bedtime.  04/13/17   [provider]  spironolactone (ALDACTONE) 25 MG tablet Take 25 mg by mouth daily.  10/08/18   [provider]  zinc oxide ([employer] ZINC OXIDE) 20 % ointment Apply 1 application topically 2 (two) times daily as needed for irritation.     [provider]    Allergies Amiodarone and Donepezil  Family History  Problem Relation Age of Onset    Heart failure Father    Heart failure Brother    Atrial fibrillation Sister    Prostate cancer Neg Hx    Kidney cancer Neg Hx    Bladder Cancer Neg Hx     Social History Social History   Tobacco Use   Smoking status: Never   Smokeless tobacco: Never  Substance Use Topics   Alcohol use: No   Drug use: No    Review of Systems  Constitutional: No fever/chills Eyes: No visual changes. ENT: No sore throat.  Right-sided nosebleed as above. Cardiovascular: Denies chest pain. Respiratory: Denies shortness of breath. Gastrointestinal: No abdominal pain.  No nausea, no vomiting.  No diarrhea.  No constipation. Genitourinary: Negative for dysuria. Musculoskeletal: Negative for back pain. Skin: Negative for rash. Neurological: Negative for headaches, focal weakness or numbness. ____________________________________________   PHYSICAL EXAM:  VITAL SIGNS: ED Triage Vitals  Enc Vitals Group     BP 11/20/20 1337 93/60     Pulse Rate 11/20/20 1337 68     Resp 11/20/20 1337 18  Temp 11/20/20 1337 98.2 F (36.8 C)     Temp Source 11/20/20 1337 Oral     SpO2 11/20/20 1337 98 %     Weight 11/20/20 1338 200 lb (90.7 kg)     Height 11/20/20 1338 6' (1.829 m)     Head Circumference --      Peak Flow --      Pain Score --      Pain Loc --      Pain Edu? --      Excl. in [place]? --     Constitutional: Alert and oriented. Well appearing and in no acute distress. Eyes: Conjunctivae are normal. PERRL. EOMI. Head: Atraumatic. Nose: No congestion/rhinnorhea.  Patient with a area of bleeding noted to the anterior septum of the right nostril.  No septal hematomas appreciated. Mouth/Throat: Mucous membranes are moist.  Oropharynx non-erythematous. Neck: No stridor.   Cardiovascular: Normal rate, regular rhythm. Grossly normal heart sounds.  Good peripheral circulation. Respiratory: Normal respiratory effort.  No retractions. Lungs CTAB. Gastrointestinal: Soft and nontender. No distention.  No abdominal bruits. No CVA tenderness. Musculoskeletal: No lower extremity tenderness nor edema.  No joint effusions. Skin:  Skin is warm, dry and intact. No rash noted.   ____________________________________________   LABS (all labs ordered are listed, but only abnormal results are displayed)  Labs Reviewed  CBC - Abnormal; Notable for the following components:      Result Value   WBC 11.6 (*)    Platelets 128 (*)    All other components within normal limits  PROTIME-INR - Abnormal; Notable for the following components:   Prothrombin Time 15.7 (*)    INR 1.3 (*)    All other components within normal limits   ____________________________________________  EKG   ____________________________________________  RADIOLOGY I, Melvenia Needles, personally viewed and evaluated these images (plain radiographs) as part of my medical decision making, as well as reviewing the written report by the radiologist.  ED MD interpretation:    Official radiology report(s): No results found.  ____________________________________________   PROCEDURES  Procedure(s) performed (including Critical Care):  Procedures  TXA-soaked gauze applied topically with nose clip x 30 minutes ____________________________________________   INITIAL IMPRESSION / ASSESSMENT AND PLAN / ED COURSE  As part of my medical decision making, I reviewed the following data within the [place] reviewed WNL and Notes from prior ED visits     DDX: spontaneous epistaxis, nasal trauma, rhinitis  Geriatric patient with history of dementia and A. fib on Eliquis, presents for intermittent spontaneous nosebleed since yesterday.  Patient was evaluated for his complaint, found to have stable labs without signs of acute anemia.  PT/INR are essentially normal at this time without indication of a prolonged bleeding time.  Patient's active nosebleed is treated with topical TXA in the ED.  Good  epistaxis control was achieved.  Patient discharged to to his facility with instructions to follow-up with a primary provider for ongoing symptoms.  Return precautions of been reviewed. ____________________________________________   FINAL CLINICAL IMPRESSION(S) / ED DIAGNOSES  Final diagnoses:  Right-sided epistaxis     ED Discharge Orders     None        Note:  This document was prepared using Dragon voice recognition software and may include unintentional dictation errors. robin   Melvenia Needles, [place] 11/20/20 1807    Merlyn Lot, MD 11/20/20 [phone]

## 2020-11-20 NOTE — ED Triage Notes (Signed)
Pt brought in by [facility] with a nose bleed. He had  3 last night 2 this am he is not actively bleeding

## 2020-11-20 NOTE — ED Notes (Signed)
Pt given a snack, brief changed of urine, new gown applied.

## 2021-10-29 DEATH — deceased
